# Patient Record
Sex: Female | Born: 1955 | Race: Black or African American | Hispanic: No | Marital: Married
Health system: Midwestern US, Community
[De-identification: ages and names within clinical notes are randomized; demographics above are authoritative.]

## PROBLEM LIST (undated history)

## (undated) DIAGNOSIS — N809 Endometriosis, unspecified: Secondary | ICD-10-CM

## (undated) DIAGNOSIS — M1990 Unspecified osteoarthritis, unspecified site: Secondary | ICD-10-CM

## (undated) DIAGNOSIS — G5600 Carpal tunnel syndrome, unspecified upper limb: Secondary | ICD-10-CM

## (undated) DIAGNOSIS — J45909 Unspecified asthma, uncomplicated: Secondary | ICD-10-CM

## (undated) DIAGNOSIS — E7800 Pure hypercholesterolemia, unspecified: Secondary | ICD-10-CM

## (undated) DIAGNOSIS — D219 Benign neoplasm of connective and other soft tissue, unspecified: Secondary | ICD-10-CM

## (undated) DIAGNOSIS — F419 Anxiety disorder, unspecified: Secondary | ICD-10-CM

## (undated) DIAGNOSIS — J309 Allergic rhinitis, unspecified: Secondary | ICD-10-CM

## (undated) DIAGNOSIS — E785 Hyperlipidemia, unspecified: Secondary | ICD-10-CM

## (undated) DIAGNOSIS — M542 Cervicalgia: Secondary | ICD-10-CM

## (undated) DIAGNOSIS — G43909 Migraine, unspecified, not intractable, without status migrainosus: Secondary | ICD-10-CM

## (undated) DIAGNOSIS — R7309 Other abnormal glucose: Secondary | ICD-10-CM

## (undated) DIAGNOSIS — K649 Unspecified hemorrhoids: Secondary | ICD-10-CM

## (undated) DIAGNOSIS — I639 Cerebral infarction, unspecified: Secondary | ICD-10-CM

## (undated) DIAGNOSIS — T7840XA Allergy, unspecified, initial encounter: Secondary | ICD-10-CM

## (undated) DIAGNOSIS — G459 Transient cerebral ischemic attack, unspecified: Secondary | ICD-10-CM

## (undated) DIAGNOSIS — N879 Dysplasia of cervix uteri, unspecified: Secondary | ICD-10-CM

## (undated) DIAGNOSIS — I10 Essential (primary) hypertension: Secondary | ICD-10-CM

## (undated) DIAGNOSIS — M25562 Pain in left knee: Secondary | ICD-10-CM

## (undated) DIAGNOSIS — Z96652 Presence of left artificial knee joint: Secondary | ICD-10-CM

## (undated) DIAGNOSIS — M1712 Unilateral primary osteoarthritis, left knee: Secondary | ICD-10-CM

## (undated) HISTORY — DX: Allergy, unspecified, initial encounter: T78.40XA

## (undated) HISTORY — DX: Other abnormal glucose: R73.09

## (undated) HISTORY — DX: Migraine, unspecified, not intractable, without status migrainosus: G43.909

## (undated) HISTORY — DX: Unspecified asthma, uncomplicated: J45.909

## (undated) HISTORY — DX: Transient cerebral ischemic attack, unspecified: G45.9

## (undated) HISTORY — DX: Carpal tunnel syndrome, unspecified upper limb: G56.00

## (undated) HISTORY — DX: Endometriosis, unspecified: N80.9

## (undated) HISTORY — DX: Anxiety disorder, unspecified: F41.9

## (undated) HISTORY — DX: Unspecified osteoarthritis, unspecified site: M19.90

## (undated) HISTORY — DX: Allergic rhinitis, unspecified: J30.9

## (undated) HISTORY — PX: OTHER SURGICAL HISTORY: SHX169

## (undated) HISTORY — DX: Unspecified hemorrhoids: K64.9

## (undated) HISTORY — PX: TUBAL LIGATION: SHX77

## (undated) HISTORY — PX: ABDOMINAL HYSTERECTOMY: SHX81

## (undated) HISTORY — PX: COLONOSCOPY: SHX174

## (undated) HISTORY — DX: Dysplasia of cervix uteri, unspecified: N87.9

## (undated) HISTORY — PX: TRIGGER FINGER RELEASE: SHX641

## (undated) HISTORY — PX: CARPAL TUNNEL RELEASE: SHX101

## (undated) HISTORY — DX: Hyperlipidemia, unspecified: E78.5

---

## 1998-03-31 ENCOUNTER — Other Ambulatory Visit: Admission: RE | Admit: 1998-03-31 | Discharge: 1998-03-31 | Payer: Self-pay | Admitting: Obstetrics and Gynecology

## 1998-05-09 ENCOUNTER — Ambulatory Visit (HOSPITAL_COMMUNITY): Admission: RE | Admit: 1998-05-09 | Discharge: 1998-05-09 | Payer: Self-pay | Admitting: Obstetrics and Gynecology

## 1998-06-22 ENCOUNTER — Observation Stay (HOSPITAL_COMMUNITY): Admission: RE | Admit: 1998-06-22 | Discharge: 1998-06-23 | Payer: Self-pay | Admitting: Obstetrics and Gynecology

## 1999-06-01 ENCOUNTER — Encounter: Payer: Self-pay | Admitting: Obstetrics and Gynecology

## 1999-06-01 ENCOUNTER — Other Ambulatory Visit: Admission: RE | Admit: 1999-06-01 | Discharge: 1999-06-01 | Payer: Self-pay | Admitting: Obstetrics and Gynecology

## 1999-06-01 ENCOUNTER — Ambulatory Visit (HOSPITAL_COMMUNITY): Admission: RE | Admit: 1999-06-01 | Discharge: 1999-06-01 | Payer: Self-pay | Admitting: Physician Assistant

## 1999-09-10 ENCOUNTER — Ambulatory Visit (HOSPITAL_COMMUNITY): Admission: RE | Admit: 1999-09-10 | Discharge: 1999-09-10 | Payer: Self-pay | Admitting: Obstetrics and Gynecology

## 1999-09-10 ENCOUNTER — Encounter: Payer: Self-pay | Admitting: Obstetrics and Gynecology

## 2000-06-23 ENCOUNTER — Other Ambulatory Visit: Admission: RE | Admit: 2000-06-23 | Discharge: 2000-06-23 | Payer: Self-pay | Admitting: Obstetrics and Gynecology

## 2000-09-10 ENCOUNTER — Ambulatory Visit (HOSPITAL_COMMUNITY): Admission: RE | Admit: 2000-09-10 | Discharge: 2000-09-10 | Payer: Self-pay | Admitting: Oral Surgery

## 2000-09-10 ENCOUNTER — Encounter: Payer: Self-pay | Admitting: Oral Surgery

## 2001-06-30 ENCOUNTER — Other Ambulatory Visit: Admission: RE | Admit: 2001-06-30 | Discharge: 2001-06-30 | Payer: Self-pay | Admitting: Obstetrics and Gynecology

## 2002-08-13 ENCOUNTER — Other Ambulatory Visit: Admission: RE | Admit: 2002-08-13 | Discharge: 2002-08-13 | Payer: Self-pay | Admitting: Obstetrics and Gynecology

## 2003-10-15 ENCOUNTER — Ambulatory Visit (HOSPITAL_COMMUNITY): Admission: RE | Admit: 2003-10-15 | Discharge: 2003-10-15 | Payer: Self-pay | Admitting: Family Medicine

## 2004-08-29 ENCOUNTER — Other Ambulatory Visit: Admission: RE | Admit: 2004-08-29 | Discharge: 2004-08-29 | Payer: Self-pay | Admitting: Obstetrics and Gynecology

## 2005-09-12 ENCOUNTER — Other Ambulatory Visit: Admission: RE | Admit: 2005-09-12 | Discharge: 2005-09-12 | Payer: Self-pay | Admitting: Obstetrics and Gynecology

## 2005-10-07 ENCOUNTER — Encounter: Admission: RE | Admit: 2005-10-07 | Discharge: 2005-10-07 | Payer: Self-pay | Admitting: Obstetrics and Gynecology

## 2006-05-02 ENCOUNTER — Encounter: Admission: RE | Admit: 2006-05-02 | Discharge: 2006-05-02 | Payer: Self-pay | Admitting: Family Medicine

## 2008-10-17 ENCOUNTER — Encounter: Payer: Self-pay | Admitting: Gastroenterology

## 2008-12-14 ENCOUNTER — Ambulatory Visit: Payer: Self-pay | Admitting: Gastroenterology

## 2008-12-28 ENCOUNTER — Ambulatory Visit: Payer: Self-pay | Admitting: Gastroenterology

## 2010-10-20 ENCOUNTER — Encounter: Payer: Self-pay | Admitting: Family Medicine

## 2010-10-21 ENCOUNTER — Encounter: Payer: Self-pay | Admitting: Obstetrics and Gynecology

## 2011-05-01 ENCOUNTER — Telehealth: Payer: Self-pay | Admitting: Gastroenterology

## 2011-05-01 NOTE — Telephone Encounter (Signed)
10Y  IS RECOMMEBDED WITH 2ND DEGREE RELATIVES AND  A PRIOR NEGATIVE COLON EXAM..Marland Kitchen

## 2011-05-01 NOTE — Telephone Encounter (Signed)
Informed Diana at Dr [facility], I have sent for the chart. Lafonda Mosses stated the pt's uncle had Colon Cancer and her dad had Stomach Cancer as well as other cancers. Does pt need 3 yr COLON? Pt [phone]   Cell [phone]

## 2011-05-01 NOTE — Telephone Encounter (Signed)
Notified Diana at [facility] that per Dr Jarold Motto, since her uncle is a 2nd degree relative and pt had a negative COLON, recommendations are for 10 year Recall COLON.

## 2011-05-01 NOTE — Telephone Encounter (Signed)
Dr Jarold Motto, pt's chart lists family hx of Colon Cancer - uncle. Please review the chart and see if the recall needs to be < 10years. Dr [facility] is questioning the amount of time. Chart is on your desk. Thanks.

## 2011-10-10 ENCOUNTER — Other Ambulatory Visit: Payer: Self-pay | Admitting: Obstetrics and Gynecology

## 2011-10-10 DIAGNOSIS — R928 Other abnormal and inconclusive findings on diagnostic imaging of breast: Secondary | ICD-10-CM

## 2011-10-22 ENCOUNTER — Ambulatory Visit
Admission: RE | Admit: 2011-10-22 | Discharge: 2011-10-22 | Disposition: A | Discharge status: Home / Self Care | Payer: Federal, State, Local not specified - PPO | Source: Ambulatory Visit | Attending: Obstetrics and Gynecology | Admitting: Obstetrics and Gynecology

## 2011-10-22 DIAGNOSIS — R928 Other abnormal and inconclusive findings on diagnostic imaging of breast: Secondary | ICD-10-CM

## 2012-05-29 ENCOUNTER — Other Ambulatory Visit: Payer: Self-pay | Admitting: Orthopedic Surgery

## 2012-06-08 ENCOUNTER — Ambulatory Visit (HOSPITAL_BASED_OUTPATIENT_CLINIC_OR_DEPARTMENT_OTHER)
Admission: RE | Admit: 2012-06-08 | Payer: Federal, State, Local not specified - PPO | Source: Ambulatory Visit | Admitting: Orthopedic Surgery

## 2012-06-08 ENCOUNTER — Encounter (HOSPITAL_BASED_OUTPATIENT_CLINIC_OR_DEPARTMENT_OTHER): Admission: RE | Payer: Self-pay | Source: Ambulatory Visit

## 2012-06-08 SURGERY — CARPAL TUNNEL RELEASE
Anesthesia: Choice | Laterality: Right

## 2012-09-30 DIAGNOSIS — I639 Cerebral infarction, unspecified: Secondary | ICD-10-CM

## 2012-09-30 HISTORY — DX: Cerebral infarction, unspecified: I63.9

## 2012-10-22 ENCOUNTER — Emergency Department (HOSPITAL_COMMUNITY): Payer: Federal, State, Local not specified - PPO

## 2012-10-22 ENCOUNTER — Encounter (HOSPITAL_COMMUNITY): Payer: Self-pay | Admitting: Emergency Medicine

## 2012-10-22 ENCOUNTER — Emergency Department (HOSPITAL_COMMUNITY)
Admission: EM | Admit: 2012-10-22 | Discharge: 2012-10-22 | Disposition: A | Discharge status: Home / Self Care | Payer: Federal, State, Local not specified - PPO | Attending: Emergency Medicine | Admitting: Emergency Medicine

## 2012-10-22 DIAGNOSIS — R51 Headache: Secondary | ICD-10-CM

## 2012-10-22 DIAGNOSIS — M542 Cervicalgia: Secondary | ICD-10-CM | POA: Insufficient documentation

## 2012-10-22 DIAGNOSIS — I10 Essential (primary) hypertension: Secondary | ICD-10-CM | POA: Insufficient documentation

## 2012-10-22 DIAGNOSIS — I059 Rheumatic mitral valve disease, unspecified: Secondary | ICD-10-CM

## 2012-10-22 DIAGNOSIS — E7800 Pure hypercholesterolemia, unspecified: Secondary | ICD-10-CM | POA: Insufficient documentation

## 2012-10-22 DIAGNOSIS — R110 Nausea: Secondary | ICD-10-CM | POA: Insufficient documentation

## 2012-10-22 DIAGNOSIS — R42 Dizziness and giddiness: Secondary | ICD-10-CM

## 2012-10-22 DIAGNOSIS — Z8742 Personal history of other diseases of the female genital tract: Secondary | ICD-10-CM | POA: Insufficient documentation

## 2012-10-22 DIAGNOSIS — Z79899 Other long term (current) drug therapy: Secondary | ICD-10-CM | POA: Insufficient documentation

## 2012-10-22 HISTORY — DX: Essential (primary) hypertension: I10

## 2012-10-22 HISTORY — DX: Cervicalgia: M54.2

## 2012-10-22 HISTORY — DX: Benign neoplasm of connective and other soft tissue, unspecified: D21.9

## 2012-10-22 HISTORY — DX: Pure hypercholesterolemia, unspecified: E78.00

## 2012-10-22 LAB — HEMOGLOBIN A1C: Mean Plasma Glucose: 128 mg/dL — ABNORMAL HIGH (ref <117)

## 2012-10-22 LAB — COMPREHENSIVE METABOLIC PANEL
ALT: 19 U/L (ref 0–35)
AST: 18 U/L (ref 0–37)
Albumin: 3.9 g/dL (ref 3.5–5.2)
Calcium: 9.8 mg/dL (ref 8.4–10.5)
Chloride: 106 mEq/L (ref 96–112)
Creatinine, Ser: 0.6 mg/dL (ref 0.50–1.10)
Sodium: 141 mEq/L (ref 135–145)

## 2012-10-22 LAB — PROTIME-INR: Prothrombin Time: 13.1 seconds (ref 11.6–15.2)

## 2012-10-22 LAB — LIPID PANEL
LDL Cholesterol: 95 mg/dL (ref 0–99)
VLDL: 14 mg/dL (ref 0–40)

## 2012-10-22 LAB — CBC
MCH: 27 pg (ref 26.0–34.0)
MCV: 80 fL (ref 78.0–100.0)
Platelets: 185 10*3/uL (ref 150–400)
RDW: 14.3 % (ref 11.5–15.5)
WBC: 5.2 10*3/uL (ref 4.0–10.5)

## 2012-10-22 LAB — APTT: aPTT: 26 seconds (ref 24–37)

## 2012-10-22 MED ORDER — ASPIRIN 325 MG PO TABS
325.0000 mg | ORAL_TABLET | Freq: Once | ORAL | Status: AC
  Administered 2012-10-22: 325 mg via ORAL
  Filled 2012-10-22: qty 1

## 2012-10-22 NOTE — ED Notes (Signed)
Pt c/o headache ongoing for "years". Pt also c/o pain in bottom of feet, pain in calf and dizziness.

## 2012-10-22 NOTE — Progress Notes (Signed)
  Echocardiogram 2D Echocardiogram has been performed.  Dawn [name] A 10/22/2012, 11:50 AM

## 2012-10-22 NOTE — ED Provider Notes (Signed)
History     CSN: [id number]  Arrival date & time 10/22/12  0806   First MD Initiated Contact with Patient 10/22/12 [phone]      Chief Complaint  Patient presents with  . Headache    (Consider location/radiation/quality/duration/timing/severity/associated sxs/prior treatment) HPI Comments: Patient with a history chronic neck pain, hypertension, and high cholesterol presents with a headache and episodes of dizziness and slurred speech. She states she has had headaches for over 20 years. One week ago she experienced a couple episodes of slurred speech and facial droop that started suddenly and lasted for less than a minute. She states that over the past 24 hours she has been dizzy and felt unbalanced. She denies lightheadedness/near-syncope and vertigo. She has not fallen but feels unsteady walking and standing, like she is being pulled to one side. She denies numbness or weakness in her extremities. She She states she has been taking Marlin Canary Powder for her headaches and that has seemed to help temporarily. She experiences nausea without vomiting. No diarrhea or constipation. No fevers. No chest pain or SOB. Her mother has a history of stroke and aneurysm. Onset gradual, course constant. Nothing has alleviated symptoms.   Patient is a 57 y.o. female presenting with headaches. The history is provided by the patient.  Headache  Associated symptoms include nausea. Pertinent negatives include no fever, no shortness of breath and no vomiting.    Past Medical History  Diagnosis Date  . Neck pain   . Hypertension   . High cholesterol   . Fibroids     Past Surgical History  Procedure Date  . Abdominal hysterectomy Partial    No family history on file.  History  Substance Use Topics  . Smoking status: Never Smoker   . Smokeless tobacco: Not on file  . Alcohol Use: 0.6 oz/week    1 Glasses of wine per week     Comment: seldom    OB History    Grav Para Term Preterm Abortions TAB SAB Ect  Mult Living                  Review of Systems  Constitutional: Negative for fever, chills and activity change.  HENT: Positive for neck pain. Negative for hearing loss, ear pain, congestion, rhinorrhea, neck stiffness, dental problem, sinus pressure, tinnitus and ear discharge.   Eyes: Negative for photophobia, pain, discharge, redness and visual disturbance.  Respiratory: Negative for shortness of breath.   Cardiovascular: Negative for chest pain.  Gastrointestinal: Positive for nausea. Negative for vomiting, abdominal pain, diarrhea and constipation.  Musculoskeletal: Positive for arthralgias. Negative for gait problem.  Skin: Negative for rash.  Neurological: Positive for dizziness (dysequilbrium), speech difficulty (resolved) and headaches (at baseline). Negative for syncope, facial asymmetry, weakness, light-headedness and numbness.  Psychiatric/Behavioral: Negative for confusion.    Allergies  Review of patient's allergies indicates no known allergies.  Home Medications   Current Outpatient Rx  Name  Route  Sig  Dispense  Refill  . GOODY HEADACHE PO   Oral   Take 1 packet by mouth daily as needed. For pain         . CALCIUM-VITAMIN D PO   Oral   Take 1 tablet by mouth daily.         . CYCLOBENZAPRINE HCL 10 MG PO TABS   Oral   Take 10 mg by mouth 3 (three) times daily as needed. For headache         . ADULT MULTIVITAMIN W/MINERALS  CH   Oral   Take 1 tablet by mouth daily.         Marland Kitchen NAPROXEN SODIUM 220 MG PO TABS   Oral   Take 440 mg by mouth 2 (two) times daily as needed. For pain         . ROSUVASTATIN CALCIUM 5 MG PO TABS   Oral   Take 5 mg by mouth daily.         . TRIAMTERENE-HCTZ 37.5-25 MG PO TABS   Oral   Take 1 tablet by mouth daily.           BP 123/81  Pulse 84  Temp 98.1 F (36.7 C) (Oral)  Resp 18  SpO2 99%  Physical Exam  Nursing note and vitals reviewed. Constitutional: She is oriented to person, place, and time. She  appears well-developed and well-nourished.  HENT:  Head: Normocephalic and atraumatic.  Right Ear: Tympanic membrane, external ear and ear canal normal.  Left Ear: Tympanic membrane, external ear and ear canal normal.  Nose: Nose normal.  Mouth/Throat: Uvula is midline, oropharynx is clear and moist and mucous membranes are normal.  Eyes: Conjunctivae normal, EOM and lids are normal. Pupils are equal, round, and reactive to light. Right eye exhibits no nystagmus. Left eye exhibits no nystagmus.  Neck: Normal range of motion. Neck supple. Carotid bruit is not present.  Cardiovascular: Normal rate, regular rhythm and normal heart sounds.   Pulmonary/Chest: Effort normal and breath sounds normal.  Abdominal: Soft. Bowel sounds are normal. There is no tenderness.  Musculoskeletal: Normal range of motion.       Cervical back: She exhibits normal range of motion, no tenderness and no bony tenderness.  Neurological: She is alert and oriented to person, place, and time. She has normal strength and normal reflexes. No cranial nerve deficit or sensory deficit. Gait (unsteady) abnormal. Coordination normal. GCS eye subscore is 4. GCS verbal subscore is 5. GCS motor subscore is 6.       Patient very unsteady during Romberg testing  Skin: Skin is warm and dry.  Psychiatric: She has a normal mood and affect.    ED Course  Procedures (including critical care time)  Labs Reviewed  COMPREHENSIVE METABOLIC PANEL - Abnormal; Notable for the following:    Glucose, Bld 101 (*)     All other components within normal limits  CBC  PROTIME-INR  APTT  LIPID PANEL  HEMOGLOBIN A1C  URINALYSIS, ROUTINE W REFLEX MICROSCOPIC   Mr Brain Wo Contrast  10/22/2012  *RADIOLOGY REPORT*  Clinical Data:  Headaches and dizziness.  Hypertension.  High cholesterol.  Family history of aneurysm.  MRI BRAIN WITHOUT CONTRAST MRA HEAD WITHOUT CONTRAST  Technique: Multiplanar, multiecho pulse sequences of the brain and  surrounding structures were obtained according to standard protocol without intravenous contrast.  Angiographic images of the head were obtained using MRA technique without contrast.  Comparison: MRI and MRA angiogram 10/15/2003.  MRI HEAD  Findings:  No acute infarct.  No MR evidence of intracranial hemorrhage.  Mild nonspecific white matter type changes probably related to result of small vessel disease in this hypertensive patient with high cholesterol.  Other causes of white matter type changes felt to be less likely considerations include that secondary to; vasculitis, inflammatory process, infectious process, demyelinating process or migraine headaches.  No hydrocephalus.  No intracranial mass lesion detected on this unenhanced exam.  Major intracranial vascular structures are patent.  Slightly heterogeneous appearance of bone marrow without significant change  and probably normal for this patient.  Cervical medullary junction, pituitary region and pineal region unremarkable.  Very mild exophthalmos.  Minimal mucosal thickening ethmoid sinus air cell and maxillary sinuses.  IMPRESSION: No acute infarct.  Mild small vessel disease type changes suspected as discussed above.  MRA HEAD  Findings: No aneurysm identified.  Left posterior communicating artery infundibulum slightly larger than right.  Minimal irregularity anterior aspect the cavernous segment left internal carotid artery.  Anterior circulation without medium or large size vessel significant stenosis or occlusion.  Left vertebral artery is dominant in size.  Mild branch vessel irregularity.  IMPRESSION: No aneurysm noted.  Please see above.   Original Report Authenticated By: Lacy Duverney, M.D.    Mr Mra Head/brain Wo Cm  10/22/2012  *RADIOLOGY REPORT*  Clinical Data:  Headaches and dizziness.  Hypertension.  High cholesterol.  Family history of aneurysm.  MRI BRAIN WITHOUT CONTRAST MRA HEAD WITHOUT CONTRAST  Technique: Multiplanar, multiecho pulse  sequences of the brain and surrounding structures were obtained according to standard protocol without intravenous contrast.  Angiographic images of the head were obtained using MRA technique without contrast.  Comparison: MRI and MRA angiogram 10/15/2003.  MRI HEAD  Findings:  No acute infarct.  No MR evidence of intracranial hemorrhage.  Mild nonspecific white matter type changes probably related to result of small vessel disease in this hypertensive patient with high cholesterol.  Other causes of white matter type changes felt to be less likely considerations include that secondary to; vasculitis, inflammatory process, infectious process, demyelinating process or migraine headaches.  No hydrocephalus.  No intracranial mass lesion detected on this unenhanced exam.  Major intracranial vascular structures are patent.  Slightly heterogeneous appearance of bone marrow without significant change and probably normal for this patient.  Cervical medullary junction, pituitary region and pineal region unremarkable.  Very mild exophthalmos.  Minimal mucosal thickening ethmoid sinus air cell and maxillary sinuses.  IMPRESSION: No acute infarct.  Mild small vessel disease type changes suspected as discussed above.  MRA HEAD  Findings: No aneurysm identified.  Left posterior communicating artery infundibulum slightly larger than right.  Minimal irregularity anterior aspect the cavernous segment left internal carotid artery.  Anterior circulation without medium or large size vessel significant stenosis or occlusion.  Left vertebral artery is dominant in size.  Mild branch vessel irregularity.  IMPRESSION: No aneurysm noted.  Please see above.   Original Report Authenticated By: Lacy Duverney, M.D.      1. Dizziness   2. Headache     9:05 AM Patient seen and examined. D/w Dr. Ignacia Palma. Started on TIA protocol. Will get MRI.   Vital signs reviewed and are as follows: Filed Vitals:   10/22/12 0841  BP:   Pulse:     Temp: 98.2 F (36.8 C)  Resp:   BP 123/81  Pulse 84  Temp 98.2 F (36.8 C) (Oral)  Resp 18  SpO2 99%  All results reviewed by myself. Patient informed. Results d/w Dr. Ignacia Palma.   Patient to continue taking aspirin every day. She has primary care physician followup tomorrow.  Patient counseled to return if they have weakness in their arms or legs, slurred speech, trouble walking or talking, confusion, trouble with their balance, or if they have any other concerns. Patient verbalizes understanding and agrees with plan.     MDM  Patient with TIA symptoms, workup in emergency department is negative. Patient has PCP followup. Patient at neurological baseline at discharge.  Renne Crigler, [place] 10/22/12 [phone]

## 2012-10-22 NOTE — ED Notes (Signed)
Spoke with MRI, pt was taken to vascular for her doppler after her MRI was finished.

## 2012-10-22 NOTE — ED Provider Notes (Signed)
Medical screening examination/treatment/procedure(s) were performed by non-physician practitioner and as supervising physician I was immediately available for consultation/collaboration.   Carleene Cooper III, MD 10/22/12 2025

## 2012-10-22 NOTE — Progress Notes (Signed)
*  PRELIMINARY RESULTS* Vascular Ultrasound Carotid Duplex (Doppler) has been completed.  There is no obvious evidence of hemodynamically significant internal carotid artery stenosis >40% bilaterally. Vertebral arteries are patent with antegrade flow.  10/22/2012 11:30 AM Gertie Fey, RDMS, RDCS

## 2013-10-15 ENCOUNTER — Other Ambulatory Visit: Payer: Self-pay | Admitting: Obstetrics and Gynecology

## 2013-10-15 DIAGNOSIS — R928 Other abnormal and inconclusive findings on diagnostic imaging of breast: Secondary | ICD-10-CM

## 2013-10-25 ENCOUNTER — Other Ambulatory Visit: Payer: Self-pay | Admitting: Obstetrics and Gynecology

## 2013-10-25 ENCOUNTER — Ambulatory Visit
Admission: RE | Admit: 2013-10-25 | Discharge: 2013-10-25 | Disposition: A | Discharge status: Home / Self Care | Payer: Federal, State, Local not specified - PPO | Source: Ambulatory Visit | Attending: Obstetrics and Gynecology | Admitting: Obstetrics and Gynecology

## 2013-10-25 DIAGNOSIS — R921 Mammographic calcification found on diagnostic imaging of breast: Secondary | ICD-10-CM

## 2013-10-25 DIAGNOSIS — R928 Other abnormal and inconclusive findings on diagnostic imaging of breast: Secondary | ICD-10-CM

## 2013-10-27 ENCOUNTER — Ambulatory Visit
Admission: RE | Admit: 2013-10-27 | Discharge: 2013-10-27 | Disposition: A | Discharge status: Home / Self Care | Payer: Federal, State, Local not specified - PPO | Source: Ambulatory Visit | Attending: Obstetrics and Gynecology | Admitting: Obstetrics and Gynecology

## 2013-10-27 DIAGNOSIS — R921 Mammographic calcification found on diagnostic imaging of breast: Secondary | ICD-10-CM

## 2014-01-21 ENCOUNTER — Encounter: Payer: Self-pay | Admitting: Internal Medicine

## 2014-03-15 ENCOUNTER — Ambulatory Visit (INDEPENDENT_AMBULATORY_CARE_PROVIDER_SITE_OTHER): Payer: Federal, State, Local not specified - PPO | Admitting: Internal Medicine

## 2014-03-15 ENCOUNTER — Encounter: Payer: Self-pay | Admitting: Internal Medicine

## 2014-03-15 VITALS — BP 108/80 | HR 80 | Ht 62.0 in | Wt 169.8 lb

## 2014-03-15 DIAGNOSIS — Z800 Family history of malignant neoplasm of digestive organs: Secondary | ICD-10-CM

## 2014-03-15 DIAGNOSIS — Z1211 Encounter for screening for malignant neoplasm of colon: Secondary | ICD-10-CM

## 2014-03-15 MED ORDER — MOVIPREP 100 G PO SOLR: 1.0000 | Freq: Once | ORAL | Status: DC

## 2014-03-15 NOTE — Patient Instructions (Signed)

## 2014-03-15 NOTE — Progress Notes (Signed)
HISTORY OF PRESENT ILLNESS:  Dawn [name] is a 58 y.o. female with past medical history as listed below. She is sent today by her primary care provider, Dr. Kelton Pillar, regarding screening colonoscopy. Patient underwent initial screening colonoscopy in March of 2010 with Dr. Verl Blalock. The examination was normal. Followup in 10 years recommended. Patient is sent today regarding interval colonoscopy at this time given a family history of colon cancer. She reports that her father died of advanced colon cancer at age 27. As well, and maternal uncle with a history of colon cancer in his 10s. Except for occasional constipation, the patient's GI review of systems is unremarkable. Her chronic medical problems are stable. She recently underwent her annual comprehensive evaluation in April (reviewed the office note).  REVIEW OF SYSTEMS:  All non-GI ROS negative except for sinus and allergy trouble, arthritis, headaches, itching, muscle cramps, skin rash, breast changes  Past Medical History  Diagnosis Date  . Neck pain   . Hypertension   . High cholesterol   . Fibroids   . Transient cerebral ischemia   . Allergic rhinitis   . GERD (gastroesophageal reflux disease)   . Carpal tunnel syndrome     right  . Anxiety   . Hemorrhoids   . Migraine   . Endometriosis   . Cervical dysplasia     1996  . Abnormal glucose   . Hyperlipemia     Past Surgical History  Procedure Laterality Date  . Abdominal hysterectomy  Partial  . Cold knife cone      for dysplasia  . Tubal ligation      Social History Dawn [name]  reports that she has never smoked. She has never used smokeless tobacco. She reports that she does not drink alcohol or use illicit drugs.  family history includes Breast cancer in her maternal aunt; CVA in her mother; Colon cancer in her father and maternal uncle; Diabetes in her mother; Heart disease in her brother; Hypertension in her mother; Kidney disease in her  maternal aunt and mother.  Allergies  Allergen Reactions  . Sulfa Antibiotics Rash       PHYSICAL EXAMINATION: Vital signs: BP 108/80  Pulse 80  Ht 5\' 2"  (1.575 m)  Wt 169 lb 12.8 oz (77.021 kg)  BMI 31.05 kg/m2  Constitutional: generally well-appearing, no acute distress Psychiatric: alert and oriented x3, cooperative Eyes: extraocular movements intact, anicteric, conjunctiva pink Mouth: oral pharynx moist, no lesions Neck: supple no lymphadenopathy Cardiovascular: heart regular rate and rhythm, no murmur Lungs: clear to auscultation bilaterally Abdomen: soft, obese, nontender, nondistended, no obvious ascites, no peritoneal signs, normal bowel sounds, no organomegaly Rectal: Deferred until colonoscopy Extremities: no lower extremity edema bilaterally Skin: no lesions on visible extremities Neuro: No focal deficits.   ASSESSMENT:  #1. Family history of colon cancer as described. Due for surveillance #2. Index screening colonoscopy 2010 normal #3. General medical problems. Stable   PLAN:  #1. Screening colonoscopy.The nature of the procedure, as well as the risks, benefits, and alternatives were carefully and thoroughly reviewed with the patient. Ample time for discussion and questions allowed. The patient understood, was satisfied, and agreed to proceed. Movi prep prescribed. Patient instructed on its use

## 2014-03-28 ENCOUNTER — Encounter: Payer: Self-pay | Admitting: Internal Medicine

## 2014-05-02 ENCOUNTER — Other Ambulatory Visit: Payer: Self-pay | Admitting: Family Medicine

## 2014-05-02 ENCOUNTER — Ambulatory Visit
Admission: RE | Admit: 2014-05-02 | Discharge: 2014-05-02 | Disposition: A | Discharge status: Home / Self Care | Payer: Federal, State, Local not specified - PPO | Source: Ambulatory Visit | Attending: Family Medicine | Admitting: Family Medicine

## 2014-05-02 DIAGNOSIS — R059 Cough, unspecified: Secondary | ICD-10-CM

## 2014-05-02 DIAGNOSIS — R05 Cough: Secondary | ICD-10-CM

## 2014-05-02 DIAGNOSIS — R079 Chest pain, unspecified: Secondary | ICD-10-CM

## 2014-05-18 ENCOUNTER — Encounter: Payer: Federal, State, Local not specified - PPO | Admitting: Internal Medicine

## 2014-07-21 ENCOUNTER — Encounter: Payer: Federal, State, Local not specified - PPO | Admitting: Internal Medicine

## 2014-07-22 ENCOUNTER — Other Ambulatory Visit: Payer: Self-pay | Admitting: Family Medicine

## 2014-07-22 DIAGNOSIS — M5412 Radiculopathy, cervical region: Secondary | ICD-10-CM

## 2014-07-23 ENCOUNTER — Ambulatory Visit
Admission: RE | Admit: 2014-07-23 | Discharge: 2014-07-23 | Disposition: A | Discharge status: Home / Self Care | Payer: Federal, State, Local not specified - PPO | Source: Ambulatory Visit | Attending: Family Medicine | Admitting: Family Medicine

## 2014-07-23 DIAGNOSIS — M5412 Radiculopathy, cervical region: Secondary | ICD-10-CM

## 2014-10-11 ENCOUNTER — Encounter: Payer: Self-pay | Admitting: Internal Medicine

## 2014-10-17 ENCOUNTER — Other Ambulatory Visit: Payer: Self-pay | Admitting: Obstetrics and Gynecology

## 2014-10-18 LAB — CYTOLOGY - PAP

## 2014-11-24 ENCOUNTER — Ambulatory Visit (AMBULATORY_SURGERY_CENTER): Payer: Self-pay | Admitting: *Deleted

## 2014-11-24 VITALS — Ht 62.0 in | Wt 172.4 lb

## 2014-11-24 DIAGNOSIS — Z800 Family history of malignant neoplasm of digestive organs: Secondary | ICD-10-CM

## 2014-11-24 NOTE — Progress Notes (Signed)
No egg or soy allergy No issues with past sedation No diet pills no home 02 use emmi video to e mail Pt has a movi prep kit from 02-2014 that is not expired or has been mixed

## 2014-12-08 ENCOUNTER — Encounter: Payer: Self-pay | Admitting: Internal Medicine

## 2014-12-08 ENCOUNTER — Ambulatory Visit (AMBULATORY_SURGERY_CENTER): Payer: Federal, State, Local not specified - PPO | Admitting: Internal Medicine

## 2014-12-08 VITALS — BP 133/88 | HR 72 | Temp 97.6°F | Resp 16 | Ht 62.0 in | Wt 172.0 lb

## 2014-12-08 DIAGNOSIS — Z1211 Encounter for screening for malignant neoplasm of colon: Secondary | ICD-10-CM | POA: Diagnosis not present

## 2014-12-08 DIAGNOSIS — D124 Benign neoplasm of descending colon: Secondary | ICD-10-CM | POA: Diagnosis not present

## 2014-12-08 DIAGNOSIS — D120 Benign neoplasm of cecum: Secondary | ICD-10-CM | POA: Diagnosis not present

## 2014-12-08 DIAGNOSIS — Z800 Family history of malignant neoplasm of digestive organs: Secondary | ICD-10-CM

## 2014-12-08 MED ORDER — SODIUM CHLORIDE 0.9 % IV SOLN: 500.0000 mL | INTRAVENOUS | Status: DC

## 2014-12-08 NOTE — Progress Notes (Signed)
Called to room to assist during endoscopic procedure.  Patient ID and intended procedure confirmed with present staff. Received instructions for my participation in the procedure from the performing physician.  

## 2014-12-08 NOTE — Progress Notes (Signed)
Pt complains of headache(rates at 5) this AM.  States she took her BP medication last PM.  BP is elevated this AM.  Denies difficulty with vision or weakness.  She states she has Had a constant headache since last weekend.  Reported to [facility] Monday CRNA.

## 2014-12-08 NOTE — Patient Instructions (Signed)
YOU HAD AN ENDOSCOPIC PROCEDURE TODAY AT THE [facility]:   Refer to the procedure report that was given to you for any specific questions about what was found during the examination.  If the procedure report does not answer your questions, please call your gastroenterologist to clarify.  If you requested that your care partner not be given the details of your procedure findings, then the procedure report has been included in a sealed envelope for you to review at your convenience later.  YOU SHOULD EXPECT: Some feelings of bloating in the abdomen. Passage of more gas than usual.  Walking can help get rid of the air that was put into your GI tract during the procedure and reduce the bloating. If you had a lower endoscopy (such as a colonoscopy or flexible sigmoidoscopy) you may notice spotting of blood in your stool or on the toilet paper. If you underwent a bowel prep for your procedure, you may not have a normal bowel movement for a few days.  Please Note:  You might notice some irritation and congestion in your nose or some drainage.  This is from the oxygen used during your procedure.  There is no need for concern and it should clear up in a day or so.  SYMPTOMS TO REPORT IMMEDIATELY:   Following lower endoscopy (colonoscopy or flexible sigmoidoscopy):  Excessive amounts of blood in the stool  Significant tenderness or worsening of abdominal pains  Swelling of the abdomen that is new, acute  Fever of 100F or higher   For urgent or emergent issues, a gastroenterologist can be reached at any hour by calling [phone].   DIET: Your first meal following the procedure should be a small meal and then it is ok to progress to your normal diet. Heavy or fried foods are harder to digest and may make you feel nauseous or bloated.  Likewise, meals heavy in dairy and vegetables can increase bloating.  Drink plenty of fluids but you should avoid alcoholic beverages for 24  hours.  ACTIVITY:  You should plan to take it easy for the rest of today and you should NOT DRIVE or use heavy machinery until tomorrow (because of the sedation medicines used during the test).    FOLLOW UP: Our staff will call the number listed on your records the next business day following your procedure to check on you and address any questions or concerns that you may have regarding the information given to you following your procedure. If we do not reach you, we will leave a message.  However, if you are feeling well and you are not experiencing any problems, there is no need to return our call.  We will assume that you have returned to your regular daily activities without incident.  If any biopsies were taken you will be contacted by phone or by letter within the next 1-3 weeks.  Please call us at [phone] if you have not heard about the biopsies in 3 weeks.    SIGNATURES/CONFIDENTIALITY: You and/or your care partner have signed paperwork which will be entered into your electronic medical record.  These signatures attest to the fact that that the information above on your After Visit Summary has been reviewed and is understood.  Full responsibility of the confidentiality of this discharge information lies with you and/or your care-partner. 

## 2014-12-08 NOTE — Progress Notes (Signed)
Report to PACU, RN, vss, BBS= Clear.  

## 2014-12-08 NOTE — Op Note (Signed)
[facility]  [address] [place], [id number]   COLONOSCOPY PROCEDURE REPORT  PATIENT: Dawn, [name]  MR#: [id number] BIRTHDATE: 1956-05-06 , 73  yrs. old GENDER: female ENDOSCOPIST: Eustace Quail, MD REFERRED VO:PFYTWK Griffin, M.D. PROCEDURE DATE:  12/08/2014 PROCEDURE:   Colonoscopy, screening and Colonoscopy with snare polypectomy x 2 First Screening Colonoscopy - Avg.  risk and is 50 yrs.  old or older - No.  Prior Negative Screening - Now for repeat screening. Above average risk  History of Adenoma - Now for follow-up colonoscopy & has been > or = to 3 yrs.  N/A  Polyps Removed Today ASA CLASS:   Class II INDICATIONS:Screening for colonic neoplasia and FH Colon or Rectal Adenocarcinoma.  Dad and uncle early 53's. Last exam 2010 (-) MEDICATIONS: Monitored anesthesia care and Propofol 200 mg IV  DESCRIPTION OF PROCEDURE:   After the risks benefits and alternatives of the procedure were thoroughly explained, informed consent was obtained.  The digital rectal exam revealed no abnormalities of the rectum.   The LB [id number]  endoscope was introduced through the anus and advanced to the cecum, which was identified by both the appendix and ileocecal valve. No adverse events experienced.   The quality of the prep was excellent. (MoviPrep was used)  The instrument was then slowly withdrawn as the colon was fully examined.  COLON FINDINGS: Two polyps ranging between 3-25mm in size were found at the cecum and in the descending colon.  A polypectomy was performed with a cold snare.  The resection was complete, the polyp tissue was completely retrieved and sent to histology.   There was mild diverticulosis noted in the sigmoid colon.   The examination was otherwise normal.  Retroflexed views revealed internal hemorrhoids. The time to cecum = 1.9 Withdrawal time = 8.8   The scope was withdrawn and the procedure completed. COMPLICATIONS: There were no  immediate complications.  ENDOSCOPIC IMPRESSION: 1.   Two polyps were found at the cecum and in the descending colon; polypectomy was performed with a cold snare 2.   Mild diverticulosis was noted in the sigmoid colon 3.   The examination was otherwise normal  RECOMMENDATIONS: 1. Follow up colonoscopy in 5 years  eSigned:  Eustace Quail, MD 12/08/2014 10:16 AM   cc: Kelton Pillar, MD and The Patient

## 2014-12-09 ENCOUNTER — Telehealth: Payer: Self-pay | Admitting: *Deleted

## 2014-12-09 NOTE — Telephone Encounter (Signed)
  Follow up Call-  Call back number 12/08/2014  Post procedure Call Back phone  # [phone]  Permission to leave phone message Yes     Patient questions:  Do you have a fever, pain , or abdominal swelling? No. Pain Score  0 *  Have you tolerated food without any problems? Yes.    Have you been able to return to your normal activities? Yes.    Do you have any questions about your discharge instructions: Diet   No. Medications  No. Follow up visit  No.  Do you have questions or concerns about your Care? No.  Actions: * If pain score is 4 or above: No action needed, pain <4.

## 2014-12-13 ENCOUNTER — Encounter: Payer: Self-pay | Admitting: Internal Medicine

## 2015-02-04 ENCOUNTER — Ambulatory Visit (INDEPENDENT_AMBULATORY_CARE_PROVIDER_SITE_OTHER): Payer: Federal, State, Local not specified - PPO

## 2015-02-04 ENCOUNTER — Ambulatory Visit (INDEPENDENT_AMBULATORY_CARE_PROVIDER_SITE_OTHER): Payer: Federal, State, Local not specified - PPO | Admitting: Family Medicine

## 2015-02-04 VITALS — BP 132/84 | HR 87 | Temp 98.4°F | Resp 16 | Ht 62.25 in | Wt 171.2 lb

## 2015-02-04 DIAGNOSIS — M25562 Pain in left knee: Secondary | ICD-10-CM

## 2015-02-04 NOTE — Progress Notes (Signed)
[facility] [address] [place] [address] [phone]  Date:  02/04/2015   Name:  Dawn [name]   DOB:  [birth year]   MRN:  [id number]  PCP:  Osborne Casco, MD    Chief Complaint: Knee Pain; Numbness; and Glucose testing   History of Present Illness:  Dawn [name] is a 59 y.o. very pleasant female patient who presents with the following:  Here today as a new patient.  History of HTN, high cholesterol, possible TIA in 2014.   She got hurt in a fall on some stairs which occurred about one week ago.  She was going down some stairs, was carrying a box and slipped.  She got some bruises on her left arm, legs.  She hit her knees.  She now notes that her knees will be stiff if she sits for a while, and also her left knee will pop.   Her left knee hurts more than the right.   Also, if she sits for a period of time her left toes will feel numb.  Her toes may go numb if she sits for a few hours (like on a long drive) but resolved with moving around.     There are no active problems to display for this patient.   Past Medical History  Diagnosis Date  . Neck pain   . Hypertension   . High cholesterol   . Fibroids   . Transient cerebral ischemia     10-22-12 ED, tia s/s but negative work up in ED. see note   . Allergic rhinitis   . GERD (gastroesophageal reflux disease)   . Carpal tunnel syndrome     right  . Anxiety   . Hemorrhoids   . Migraine   . Endometriosis   . Cervical dysplasia     1996  . Abnormal glucose   . Hyperlipemia   . Arthritis     Past Surgical History  Procedure Laterality Date  . Abdominal hysterectomy  Partial  . Cold knife cone      for dysplasia  . Tubal ligation    . Carpal tunnel release Right   . Trigger finger release      thumb, middle finger   . Colonoscopy      last 11-2008 w/patterson, hems only     History  Substance Use Topics  . Smoking status: Never Smoker   . Smokeless tobacco: Never Used  .  Alcohol Use: 0.6 oz/week    1 Glasses of wine per week     Comment: occasionally    Family History  Problem Relation Age of Onset  . Colon cancer Father   . Stomach cancer Father   . Diabetes Mother   . Hypertension Mother   . CVA Mother   . Kidney disease Mother   . Colon cancer Maternal Uncle   . Breast cancer Maternal Aunt   . Kidney disease Maternal Aunt   . Heart disease Brother   . Throat cancer Maternal Uncle   . Rectal cancer Neg Hx     Allergies  Allergen Reactions  . Sulfa Antibiotics Rash    Medication list has been reviewed and updated.  Current Outpatient Prescriptions on File Prior to Visit  Medication Sig Dispense Refill  . aspirin 81 MG chewable tablet Chew 81 mg by mouth daily.    . Aspirin-Acetaminophen-Caffeine (GOODY HEADACHE PO) Take 1 packet by mouth daily as needed. For pain    .  cholecalciferol (VITAMIN D) 1000 UNITS tablet Take 2,000 Units by mouth daily.    . cyclobenzaprine (FLEXERIL) 10 MG tablet Take 10 mg by mouth 3 (three) times daily as needed. For headache    . Multiple Vitamin (MULTIVITAMIN WITH MINERALS) TABS Take 1 tablet by mouth daily.    . rosuvastatin (CRESTOR) 5 MG tablet Take 5 mg by mouth daily.    Marland Kitchen triamterene-hydrochlorothiazide (MAXZIDE-25) 37.5-25 MG per tablet Take 1 tablet by mouth daily.    . naproxen sodium (ANAPROX) 220 MG tablet Take 440 mg by mouth 2 (two) times daily as needed. For pain     No current facility-administered medications on file prior to visit.    Review of Systems:  As per HPI- otherwise negative. No head or facial injury in the fall.    Physical Examination: Filed Vitals:   02/04/15 1141  BP: 132/84  Pulse: 87  Temp: 98.4 F (36.9 C)  Resp: 16   Filed Vitals:   02/04/15 1141  Height: 5' 2.25" (1.581 m)  Weight: 171 lb 3.2 oz (77.656 kg)   Body mass index is 31.07 kg/(m^2). Ideal Body Weight: Weight in (lb) to have BMI = 25: 137.5  GEN: WDWN, NAD, Non-toxic, A & O x 3, overweight,  looks well HEENT: Atraumatic, Normocephalic. Neck supple. No masses, No LAD. Ears and Nose: No external deformity. CV: RRR, No M/G/R. No JVD. No thrill. No extra heart sounds. PULM: CTA B, no wheezes, crackles, rhonchi. No retractions. No resp. distress. No accessory muscle use. EXTR: No c/c/e NEURO Normal gait.  PSYCH: Normally interactive. Conversant. Not depressed or anxious appearing.  Calm demeanor.  She has a few resolving ecchymosis on her arms and lateral right shin.   Right knee is normal- no effusion, bruise or crepitus.  Ligaments are stable Left knee: tenderness with pressure/ lateral stress on patella.  Mild crepitus. No effusion, no heat. Ligaments are stable  [facility] reading (PRIMARY) by  Dr. Lorelei Pont. Left knee: mild degenerative change, no fracture   LEFT KNEE - COMPLETE 4+ VIEW  COMPARISON: None.  FINDINGS: Anatomic alignment of the LEFT knee. No fracture. Mild medial compartment osteoarthritis with marginal osteophytes. Mild patellofemoral osteoarthritis. No effusion.  IMPRESSION: Mild medial and patellofemoral osteoarthritis without acute osseous abnormality.  Assessment and Plan: Pain in left knee - Plan: DG Knee Complete 4 Views Left  Knee sprain following a fall last week.   Placed in a hinged knee brace, and she will use naproxen twice a day for a week or so If not resolving will look further with ortho referral or MRI  Signed Lamar Blinks, MD

## 2015-02-04 NOTE — Patient Instructions (Signed)
Wear your knee brace and try ice, elevation for your knee pan Use aleve twice a day for about one week  If your knee is still bothering you give me a call and we will either refer you to an orthopedist or order an MRI for you Take care!

## 2015-03-01 ENCOUNTER — Other Ambulatory Visit: Payer: Self-pay | Admitting: Specialist

## 2015-03-01 DIAGNOSIS — M25562 Pain in left knee: Secondary | ICD-10-CM

## 2015-03-03 ENCOUNTER — Ambulatory Visit
Admission: RE | Admit: 2015-03-03 | Discharge: 2015-03-03 | Disposition: A | Discharge status: Home / Self Care | Payer: Federal, State, Local not specified - PPO | Source: Ambulatory Visit | Attending: Specialist | Admitting: Specialist

## 2015-03-03 DIAGNOSIS — M25562 Pain in left knee: Secondary | ICD-10-CM

## 2015-03-05 ENCOUNTER — Other Ambulatory Visit: Payer: Self-pay

## 2015-03-05 ENCOUNTER — Ambulatory Visit
Admission: EM | Admit: 2015-03-05 | Discharge: 2015-03-05 | Disposition: A | Discharge status: Home / Self Care | Payer: Federal, State, Local not specified - PPO | Attending: Family Medicine | Admitting: Family Medicine

## 2015-03-05 DIAGNOSIS — R7309 Other abnormal glucose: Secondary | ICD-10-CM

## 2015-03-05 DIAGNOSIS — X58XXXS Exposure to other specified factors, sequela: Secondary | ICD-10-CM | POA: Diagnosis not present

## 2015-03-05 DIAGNOSIS — R079 Chest pain, unspecified: Secondary | ICD-10-CM | POA: Diagnosis present

## 2015-03-05 DIAGNOSIS — R51 Headache: Secondary | ICD-10-CM | POA: Diagnosis not present

## 2015-03-05 DIAGNOSIS — R002 Palpitations: Secondary | ICD-10-CM | POA: Diagnosis not present

## 2015-03-05 DIAGNOSIS — R112 Nausea with vomiting, unspecified: Secondary | ICD-10-CM | POA: Insufficient documentation

## 2015-03-05 DIAGNOSIS — S8992XS Unspecified injury of left lower leg, sequela: Secondary | ICD-10-CM | POA: Diagnosis not present

## 2015-03-05 DIAGNOSIS — R519 Headache, unspecified: Secondary | ICD-10-CM

## 2015-03-05 DIAGNOSIS — R739 Hyperglycemia, unspecified: Secondary | ICD-10-CM | POA: Insufficient documentation

## 2015-03-05 LAB — COMPREHENSIVE METABOLIC PANEL
ALK PHOS: 65 U/L (ref 38–126)
ALT: 16 U/L (ref 14–54)
AST: 20 U/L (ref 15–41)
Albumin: 3.9 g/dL (ref 3.5–5.0)
Anion gap: 7 (ref 5–15)
BUN: 17 mg/dL (ref 6–20)
CO2: 26 mmol/L (ref 22–32)
Calcium: 9.4 mg/dL (ref 8.9–10.3)
Chloride: 103 mmol/L (ref 101–111)
Creatinine, Ser: 0.71 mg/dL (ref 0.44–1.00)
GFR calc Af Amer: >60 mL/min (ref >60)
Glucose, Bld: 166 mg/dL — ABNORMAL HIGH (ref 65–99)
POTASSIUM: 3.9 mmol/L (ref 3.5–5.1)
Sodium: 136 mmol/L (ref 135–145)
Total Bilirubin: 0.5 mg/dL (ref 0.3–1.2)
Total Protein: 6.9 g/dL (ref 6.5–8.1)

## 2015-03-05 LAB — CBC WITH DIFFERENTIAL/PLATELET
BASOS PCT: 1 %
Basophils Absolute: 0.1 10*3/uL (ref 0–0.1)
EOS ABS: 0.1 10*3/uL (ref 0–0.7)
Eosinophils Relative: 1 %
HCT: 36.3 % (ref 35.0–47.0)
HEMOGLOBIN: 11.8 g/dL — AB (ref 12.0–16.0)
Lymphocytes Relative: 17 %
Lymphs Abs: 1.1 10*3/uL (ref 1.0–3.6)
MCH: 25.7 pg — ABNORMAL LOW (ref 26.0–34.0)
MCHC: 32.5 g/dL (ref 32.0–36.0)
MCV: 79.2 fL — AB (ref 80.0–100.0)
MONOS PCT: 5 %
Monocytes Absolute: 0.3 10*3/uL (ref 0.2–0.9)
NEUTROS PCT: 76 %
Neutro Abs: 4.8 10*3/uL (ref 1.4–6.5)
Platelets: 166 10*3/uL (ref 150–440)
RBC: 4.59 MIL/uL (ref 3.80–5.20)
RDW: 14.7 % — ABNORMAL HIGH (ref 11.5–14.5)
WBC: 6.3 10*3/uL (ref 3.6–11.0)

## 2015-03-05 MED ORDER — KETOROLAC TROMETHAMINE 60 MG/2ML IM SOLN: 60.0000 mg | Freq: Once | INTRAMUSCULAR | Status: DC

## 2015-03-05 MED ORDER — ONDANSETRON 8 MG PO TBDP: 8.0000 mg | ORAL_TABLET | Freq: Three times a day (TID) | ORAL | Status: DC | PRN

## 2015-03-05 MED ORDER — ONDANSETRON 8 MG PO TBDP
8.0000 mg | ORAL_TABLET | Freq: Once | ORAL | Status: AC
  Administered 2015-03-05: 8 mg via ORAL

## 2015-03-05 NOTE — Discharge Instructions (Signed)
General Headache Without Cause A general headache is pain or discomfort felt around the head or neck area. The cause may not be found.  HOME CARE   Keep all doctor visits.  Only take medicines as told by your doctor.  Lie down in a dark, quiet room when you have a headache.  Keep a journal to find out if certain things bring on headaches. For example, write down:  What you eat and drink.  How much sleep you get.  Any change to your diet or medicines.  Relax by getting a massage or doing other relaxing activities.  Put ice or heat packs on the head and neck area as told by your doctor.  Lessen stress.  Sit up straight. Do not tighten (tense) your muscles.  Quit smoking if you smoke.  Lessen how much alcohol you drink.  Lessen how much caffeine you drink, or stop drinking caffeine.  Eat and sleep on a regular schedule.  Get 7 to 9 hours of sleep, or as told by your doctor.  Keep lights dim if bright lights bother you or make your headaches worse. GET HELP RIGHT AWAY IF:   Your headache becomes really bad.  You have a fever.  You have a stiff neck.  You have trouble seeing.  Your muscles are weak, or you lose muscle control.  You lose your balance or have trouble walking.  You feel like you will pass out (faint), or you pass out.  You have really bad symptoms that are different than your first symptoms.  You have problems with the medicines given to you by your doctor.  Your medicines do not work.  Your headache feels different than the other headaches.  You feel sick to your stomach (nauseous) or throw up (vomit). MAKE SURE YOU:   Understand these instructions.  Will watch your condition.  Will get help right away if you are not doing well or get worse. Document Released: 06/25/2008 Document Revised: 12/09/2011 Document Reviewed: 09/06/2011 [facility] Patient Information 2015 [place], [place]. This information is not intended to replace advice given to  you by your health care provider. Make sure you discuss any questions you have with your health care provider.  High Blood Sugar High blood sugar (hyperglycemia) means that the level of sugar in your blood is higher than it should be. Signs of high blood sugar include:  Feeling thirsty.  Frequent peeing (urinating).  Feeling tired or sleepy.  Dry mouth.  Vision changes.  Feeling weak.  Feeling hungry but losing weight.  Numbness and tingling in your hands or feet.  Headache. When you ignore these signs, your blood sugar may keep going up. These problems may get worse, and other problems may begin. HOME CARE  Check your blood sugars as told by your doctor. Write down the numbers with the date and time.  Take the right amount of insulin or diabetes pills at the right time. Write down the dose with date and time.  Refill your insulin or diabetes pills before running out.  Watch what you eat. Follow your meal plan.  Drink liquids without sugar, such as water. Check with your doctor if you have kidney or heart disease.  Follow your doctor's orders for exercise. Exercise at the same time of day.  Keep your doctor's appointments. GET HELP RIGHT AWAY IF:   You have trouble thinking or are confused.  You have fast breathing with fruity smelling breath.  You pass out (faint).  You have 2  to 3 days of high blood sugars and you do not know why.  You have chest pain.  You are feeling sick to your stomach (nauseous) or throwing up (vomiting).  You have sudden vision changes. MAKE SURE YOU:   Understand these instructions.  Will watch your condition.  Will get help right away if you are not doing well or get worse. Document Released: 07/14/2009 Document Revised: 12/09/2011 Document Reviewed: 07/14/2009 [facility] Patient Information 2015 [place], [place]. This information is not intended to replace advice given to you by your health care provider. Make sure you discuss any  questions you have with your health care provider.  Nausea and Vomiting Nausea is a sick feeling that often comes before throwing up (vomiting). Vomiting is a reflex where stomach contents come out of your mouth. Vomiting can cause severe loss of body fluids (dehydration). Children and elderly adults can become dehydrated quickly, especially if they also have diarrhea. Nausea and vomiting are symptoms of a condition or disease. It is important to find the cause of your symptoms. CAUSES   Direct irritation of the stomach lining. This irritation can result from increased acid production (gastroesophageal reflux disease), infection, food poisoning, taking certain medicines (such as nonsteroidal anti-inflammatory drugs), alcohol use, or tobacco use.  Signals from the brain.These signals could be caused by a headache, heat exposure, an inner ear disturbance, increased pressure in the brain from injury, infection, a tumor, or a concussion, pain, emotional stimulus, or metabolic problems.  An obstruction in the gastrointestinal tract (bowel obstruction).  Illnesses such as diabetes, hepatitis, gallbladder problems, appendicitis, kidney problems, cancer, sepsis, atypical symptoms of a heart attack, or eating disorders.  Medical treatments such as chemotherapy and radiation.  Receiving medicine that makes you sleep (general anesthetic) during surgery. DIAGNOSIS Your caregiver may ask for tests to be done if the problems do not improve after a few days. Tests may also be done if symptoms are severe or if the reason for the nausea and vomiting is not clear. Tests may include:  Urine tests.  Blood tests.  Stool tests.  Cultures (to look for evidence of infection).  X-rays or other imaging studies. Test results can help your caregiver make decisions about treatment or the need for additional tests. TREATMENT You need to stay well hydrated. Drink frequently but in small amounts.You may wish to  drink water, sports drinks, clear broth, or eat frozen ice pops or gelatin dessert to help stay hydrated.When you eat, eating slowly may help prevent nausea.There are also some antinausea medicines that may help prevent nausea. HOME CARE INSTRUCTIONS   Take all medicine as directed by your caregiver.  If you do not have an appetite, do not force yourself to eat. However, you must continue to drink fluids.  If you have an appetite, eat a normal diet unless your caregiver tells you differently.  Eat a variety of complex carbohydrates (rice, wheat, potatoes, bread), lean meats, yogurt, fruits, and vegetables.  Avoid high-fat foods because they are more difficult to digest.  Drink enough water and fluids to keep your urine clear or pale yellow.  If you are dehydrated, ask your caregiver for specific rehydration instructions. Signs of dehydration may include:  Severe thirst.  Dry lips and mouth.  Dizziness.  Dark urine.  Decreasing urine frequency and amount.  Confusion.  Rapid breathing or pulse. SEEK IMMEDIATE MEDICAL CARE IF:   You have blood or brown flecks (like coffee grounds) in your vomit.  You have black or bloody  stools.  You have a severe headache or stiff neck.  You are confused.  You have severe abdominal pain.  You have chest pain or trouble breathing.  You do not urinate at least once every 8 hours.  You develop cold or clammy skin.  You continue to vomit for longer than 24 to 48 hours.  You have a fever. MAKE SURE YOU:   Understand these instructions.  Will watch your condition.  Will get help right away if you are not doing well or get worse. Document Released: 09/16/2005 Document Revised: 12/09/2011 Document Reviewed: 02/13/2011 [facility] Patient Information 2015 [place], [place]. This information is not intended to replace advice given to you by your health care provider. Make sure you discuss any questions you have with your health care  provider.

## 2015-03-05 NOTE — ED Provider Notes (Signed)
CSN: [id number]     Arrival date & time 03/05/15  0844 History   First MD Initiated Contact with Patient 03/05/15 [phone]     Chief Complaint  Patient presents with  . Headache  . Chest Pain   (Consider location/radiation/quality/duration/timing/severity/associated sxs/prior Treatment) Patient is a 59 y.o. female presenting with headaches and chest pain. The history is provided by the patient and the spouse. No language interpreter was used.  Headache Pain location:  Generalized Quality:  Sharp Radiates to:  Does not radiate Severity currently:  8/10 Severity at highest:  10/10 Onset quality:  Sudden Duration:  4 hours Timing:  Constant Progression:  Worsening Chronicity:  Recurrent Similar to prior headaches: yes   Associated symptoms: dizziness, nausea, vomiting and weakness   Chest Pain Associated symptoms: dizziness, headache, nausea, palpitations, vomiting and weakness    They state that the meal that he last night she didn't care for it however she did not eat much of it and they later ordered buffalo wings in their hotel room later that night. The husband reports eating the same food and was fine and he is also talked to some of the people who were with them and none of them had any trouble.  She had a MRI last week and do not have the results yet. Her husband was curious if any of this could be the culprit.  Past Medical History  Diagnosis Date  . Neck pain   . Hypertension   . High cholesterol   . Fibroids   . Transient cerebral ischemia     10-22-12 ED, tia s/s but negative work up in ED. see note   . Allergic rhinitis   . GERD (gastroesophageal reflux disease)   . Carpal tunnel syndrome     right  . Anxiety   . Hemorrhoids   . Migraine   . Endometriosis   . Cervical dysplasia     1996  . Abnormal glucose   . Hyperlipemia   . Arthritis    Past Surgical History  Procedure Laterality Date  . Abdominal hysterectomy  Partial  . Cold knife cone      for  dysplasia  . Tubal ligation    . Carpal tunnel release Right   . Trigger finger release      thumb, middle finger   . Colonoscopy      last 11-2008 w/patterson, hems only    Family History  Problem Relation Age of Onset  . Colon cancer Father   . Stomach cancer Father   . Diabetes Mother   . Hypertension Mother   . CVA Mother   . Kidney disease Mother   . Colon cancer Maternal Uncle   . Breast cancer Maternal Aunt   . Kidney disease Maternal Aunt   . Heart disease Brother   . Throat cancer Maternal Uncle   . Rectal cancer Neg Hx    History  Substance Use Topics  . Smoking status: Never Smoker   . Smokeless tobacco: Never Used  . Alcohol Use: 0.6 oz/week    1 Glasses of wine per week     Comment: occasionally   OB History    No data available     Review of Systems  Respiratory: Positive for chest tightness.   Cardiovascular: Positive for chest pain and palpitations.  Gastrointestinal: Positive for nausea and vomiting.  Musculoskeletal: Positive for joint swelling and gait problem.       Knee pain she fell down some stairs about  3 weeks ago  Neurological: Positive for dizziness, weakness and headaches.    Allergies  Sulfa antibiotics  Home Medications   Prior to Admission medications   Medication Sig Start Date End Date Taking? Authorizing Provider  aspirin 81 MG chewable tablet Chew 81 mg by mouth daily.   Yes Historical Provider, MD  Aspirin-Acetaminophen-Caffeine (GOODY HEADACHE PO) Take 1 packet by mouth daily as needed. For pain   Yes Historical Provider, MD  naproxen sodium (ANAPROX) 220 MG tablet Take 220 mg by mouth 2 (two) times daily with a meal.   Yes Historical Provider, MD  rosuvastatin (CRESTOR) 5 MG tablet Take 5 mg by mouth daily.   Yes Historical Provider, MD  triamterene-hydrochlorothiazide (MAXZIDE-25) 37.5-25 MG per tablet Take 1 tablet by mouth daily.   Yes Historical Provider, MD  cholecalciferol (VITAMIN D) 1000 UNITS tablet Take 2,000 Units  by mouth daily.    Historical Provider, MD  cyclobenzaprine (FLEXERIL) 10 MG tablet Take 10 mg by mouth 3 (three) times daily as needed. For headache    Historical Provider, MD  Multiple Vitamin (MULTIVITAMIN WITH MINERALS) TABS Take 1 tablet by mouth daily.    Historical Provider, MD  naproxen sodium (ANAPROX) 220 MG tablet Take 440 mg by mouth 2 (two) times daily as needed. For pain    Historical Provider, MD  ondansetron (ZOFRAN ODT) 8 MG disintegrating tablet Take 1 tablet (8 mg total) by mouth every 8 (eight) hours as needed for nausea or vomiting. 03/05/15   Frederich Cha, MD   BP 141/90 mmHg  Pulse 66  Temp(Src) 97.3 F (36.3 C) (Tympanic)  Resp 16  Ht 5\' 2"  (1.575 m)  Wt 165 lb (74.844 kg)  BMI 30.17 kg/m2  SpO2 94% Physical Exam  Constitutional: She is oriented to person, place, and time. She appears well-developed and well-nourished. She appears distressed.  HENT:  Head: Normocephalic and atraumatic.  Nose: Nose normal.  Mouth/Throat: Oropharynx is clear and moist.  Eyes: Pupils are equal, round, and reactive to light.  Neck: Normal range of motion. Neck supple. No tracheal deviation present. No thyromegaly present.  Cardiovascular: Normal rate, regular rhythm and normal heart sounds.  Exam reveals no gallop.   No murmur heard. Pulmonary/Chest: Effort normal and breath sounds normal. No respiratory distress.  Abdominal: Soft. Bowel sounds are normal. She exhibits no distension. There is no tenderness.  Musculoskeletal: Normal range of motion. She exhibits no edema.  Lymphadenopathy:    She has no cervical adenopathy.  Neurological: She is oriented to person, place, and time.  Skin: Skin is warm and dry. No erythema.  Psychiatric: Her behavior is normal.  Vitals reviewed.     EKG showed normal sinus rhythm. That she states were occurring while having the EKG not transmitted on strip. ED Course  Procedures (including critical care time) Labs Review Labs Reviewed   COMPREHENSIVE METABOLIC PANEL - Abnormal; Notable for the following:    Glucose, Bld 166 (*)    All other components within normal limits  CBC WITH DIFFERENTIAL/PLATELET - Abnormal; Notable for the following:    Hemoglobin 11.8 (*)    MCV 79.2 (*)    MCH 25.7 (*)    RDW 14.7 (*)    All other components within normal limits    Imaging Review Mr Knee Left  Wo Contrast  03/04/2015   CLINICAL DATA:  [place] down steps at home 2 months ago. Persistent pain.  EXAM: MRI OF THE LEFT KNEE WITHOUT CONTRAST  TECHNIQUE: Multiplanar, multisequence MR  imaging of the knee was performed. No intravenous contrast was administered.  COMPARISON:  None.  FINDINGS: MENISCI  Medial meniscus: Oblique coursing inferior articular surface tear involving the posterior horn near the meniscal root. No complete detachment or medial protrusion of the meniscus. There is also a small peripheral inferior articular surface flap type tear with a small meniscal fragment in the medial gutter.  Lateral meniscus:  Intact.  LIGAMENTS  Cruciates:  Intact.  Collaterals:  Intact.  Mild MCL and pes anserine bursitis.  CARTILAGE  Patellofemoral: Moderate to advanced degenerative chondrosis with areas of grade 3 and grade 4 chondromalacia. This is most significant along the patellar apex and medial facet.  Medial:  Mild degenerative chondrosis.  Lateral:  Mild degenerative chondrosis.  Joint: Small joint effusion and mild synovitis. Superior and medial patellar plica are noted.  Popliteal Fossa:  Small leaking Baker's cyst.  Extensor Mechanism: The patella retinacular structures are intact and the quadriceps and patellar tendons are intact.  Bones:  No acute bony findings.  No osteochondral lesion.  IMPRESSION: 1. Medial meniscus tears as described above. 2. Intact ligamentous structures and no acute bony findings. 3. Tricompartmental degenerative changes most significant at the patellofemoral joint. 4. Mild MCL and pes anserine bursitis. 5. Small  joint effusion with mild synovitis and superior and medial patellar plica. 6. Small leaking Baker's cyst.   Electronically Signed   By: Marijo Sanes M.D.   On: 03/04/2015 11:09   Lab work is pending. EKG is fine. Since giving the Zofran and 02 she started to feel better. Will administer Toradol IM obtained CBC and CMP and observe patient to make sure that she is doing better.   Patient is doing much better. Copy of MRI given the patient. Informed about elevated blood sugar. Patient declined Toradol injection but headache is clearly gone since being on oxygen. Recommend follow-up with her doctor sometime next week. Zofran given for nausea.`    MDM   1. Acute nonintractable headache, unspecified headache type   2. Non-intractable vomiting with nausea, vomiting of unspecified type   3. Elevated blood sugar   4. Palpitations   5. Left knee injury, sequela        Frederich Cha, MD 03/05/15 1143

## 2015-03-05 NOTE — ED Notes (Signed)
Started at 5:30am with "terrible headache". Took Aleve and 1 hr. Took BC Powder with no effect.Following that, felt like "going to pass out-chest tightness, nausea and throat tight". States chest fluttering."Worst headache I've ever had"

## 2015-03-05 NOTE — ED Notes (Signed)
Family at bedside.Patient states "headache completely gone", and refuses Toradol. Denies chest pain also

## 2015-05-31 ENCOUNTER — Encounter: Payer: Self-pay | Admitting: Neurology

## 2015-05-31 ENCOUNTER — Ambulatory Visit (INDEPENDENT_AMBULATORY_CARE_PROVIDER_SITE_OTHER): Payer: Federal, State, Local not specified - PPO | Admitting: Neurology

## 2015-05-31 VITALS — BP 140/86 | HR 80 | Resp 22 | Ht 62.0 in | Wt 171.5 lb

## 2015-05-31 DIAGNOSIS — G44099 Other trigeminal autonomic cephalgias (TAC), not intractable: Secondary | ICD-10-CM | POA: Diagnosis not present

## 2015-05-31 DIAGNOSIS — R51 Headache: Secondary | ICD-10-CM | POA: Diagnosis not present

## 2015-05-31 DIAGNOSIS — M47812 Spondylosis without myelopathy or radiculopathy, cervical region: Secondary | ICD-10-CM | POA: Diagnosis not present

## 2015-05-31 DIAGNOSIS — G43009 Migraine without aura, not intractable, without status migrainosus: Secondary | ICD-10-CM | POA: Diagnosis not present

## 2015-05-31 DIAGNOSIS — G4486 Cervicogenic headache: Secondary | ICD-10-CM | POA: Insufficient documentation

## 2015-05-31 DIAGNOSIS — Z8249 Family history of ischemic heart disease and other diseases of the circulatory system: Secondary | ICD-10-CM | POA: Insufficient documentation

## 2015-05-31 NOTE — Patient Instructions (Addendum)
We will refer you to Dr. Hulan Saas for neck and headache physical therapy Consider daily preventative medication.  If the severe headaches are Cluster headaches, may consider home oxygen when you get it. Follow up in 3 months.

## 2015-05-31 NOTE — Progress Notes (Signed)
NEUROLOGY CONSULTATION NOTE  ALESA ECHEVARRIA MRN: [id number] DOB: 05-07-1956  Referring provider: Dr. Laurann Montana Primary care provider: Dr. Laurann Montana  Reason for consult:  headache  HISTORY OF PRESENT ILLNESS: Dawn [name] is a 59 year old right-handed female with hypertension and hyperlipidemia who presents for headache.  History obtained by patient and PCP note.  Labs reviewed.  Images of MRI/MRA brain from 2014 and cervical MRI from October 2015 personally reviewed.  Onset:  Many years ago Location:  Right side.  It starts in right frontal region and radiates back to down the right side of her neck Quality:  aching Intensity:  Dull but she has severe episodes that are 7/10 Aura:  no Prodrome:  no Associated symptoms:  When severe, she reports some nausea and vomiting, as well as photophobia and phonophobia.  She notes that both eyes tear. Duration:  Several hours Frequency:  5 days per week but severe episodes occur 2 to 3 days per month.  She typically wakes up with them. Triggers/exacerbating factors:  Holding head down or sleeping on right side. Relieving factors:  none  Past abortive medication:  Goody, BC powder, Excedrin.  One time, O2 worked. Past preventative medication:  none Other past therapy:  none  Current abortive medication:  Aleve, Flexeril Antihypertensive medications:  Maxzide Antidepressant medications:  none Anticonvulsant medications:  None  She had a severe episode in June.  She went to Urgent care where she was given O2, which resolved the headache.  Her mother had cerebral aneurysm which caused stroke.  On 10/22/12 she had an MRI and MRA of the head, which showed mild small vessel ischemic changes but no acute stroke, bleed, mass lesion or focal intracranial stenosis. She had an MRI of cervical spine on 07/23/14, which showed progressed cervical spondylosis and degenerative disc disease with prominent impingement at C6-7, moderate impingement at C5-6  and mild impingement at C3-4 and C4-5.  She saw a surgeon who said there was nothing surgical to perform.  PAST MEDICAL HISTORY: Past Medical History  Diagnosis Date  . Neck pain   . Hypertension   . High cholesterol   . Fibroids   . Transient cerebral ischemia     10-22-12 ED, tia s/s but negative work up in ED. see note   . Allergic rhinitis   . GERD (gastroesophageal reflux disease)   . Carpal tunnel syndrome     right  . Anxiety   . Hemorrhoids   . Migraine   . Endometriosis   . Cervical dysplasia     1996  . Abnormal glucose   . Hyperlipemia   . Arthritis     PAST SURGICAL HISTORY: Past Surgical History  Procedure Laterality Date  . Abdominal hysterectomy  Partial  . Cold knife cone      for dysplasia  . Tubal ligation    . Carpal tunnel release Right   . Trigger finger release      thumb, middle finger   . Colonoscopy      last 11-2008 w/[facility], [facility] only     MEDICATIONS: Current Outpatient Prescriptions on File Prior to Visit  Medication Sig Dispense Refill  . aspirin 81 MG chewable tablet Chew 81 mg by mouth daily.    . Aspirin-Acetaminophen-Caffeine (GOODY HEADACHE PO) Take 1 packet by mouth daily as needed. For pain    . cholecalciferol (VITAMIN D) 1000 UNITS tablet Take 2,000 Units by mouth daily.    . cyclobenzaprine (FLEXERIL) 10 MG tablet Take 10  mg by mouth 3 (three) times daily as needed. For headache    . Multiple Vitamin (MULTIVITAMIN WITH MINERALS) TABS Take 1 tablet by mouth daily.    . naproxen sodium (ANAPROX) 220 MG tablet Take 440 mg by mouth 2 (two) times daily as needed. For pain    . naproxen sodium (ANAPROX) 220 MG tablet Take 220 mg by mouth 2 (two) times daily with a meal.    . rosuvastatin (CRESTOR) 5 MG tablet Take 5 mg by mouth daily.    Marland Kitchen triamterene-hydrochlorothiazide (MAXZIDE-25) 37.5-25 MG per tablet Take 1 tablet by mouth daily.    . ondansetron (ZOFRAN ODT) 8 MG disintegrating tablet Take 1 tablet (8 mg total) by mouth every  8 (eight) hours as needed for nausea or vomiting. (Patient not taking: Reported on 05/31/2015) 20 tablet 0   No current facility-administered medications on file prior to visit.    ALLERGIES: Allergies  Allergen Reactions  . Sulfa Antibiotics Rash    FAMILY HISTORY: Family History  Problem Relation Age of Onset  . Colon cancer Father   . Stomach cancer Father   . Diabetes Mother   . Hypertension Mother   . CVA Mother   . Kidney disease Mother   . Colon cancer Maternal Uncle   . Breast cancer Maternal Aunt   . Kidney disease Maternal Aunt   . Heart disease Brother   . Throat cancer Maternal Uncle   . Rectal cancer Neg Hx   . Aneurysm Mother     SOCIAL HISTORY: Social History   Social History  . Marital Status: Married    Spouse Name: N/A  . Number of Children: 1  . Years of Education: N/A   Occupational History  . Retired    Social History Main Topics  . Smoking status: Never Smoker   . Smokeless tobacco: Never Used  . Alcohol Use: 0.6 oz/week    1 Glasses of wine per week     Comment: occasionally  . Drug Use: No  . Sexual Activity: No   Other Topics Concern  . Not on file   Social History Narrative    REVIEW OF SYSTEMS: Constitutional: No fevers, chills, or sweats, no generalized fatigue, change in appetite Eyes: No visual changes, double vision, eye pain Ear, nose and throat: No hearing loss, ear pain, nasal congestion, sore throat Cardiovascular: No chest pain, palpitations Respiratory:  No shortness of breath at rest or with exertion, wheezes GastrointestinaI: No nausea, vomiting, diarrhea, abdominal pain, fecal incontinence Genitourinary:  No dysuria, urinary retention or frequency Musculoskeletal:  Right sided neck pain Integumentary: No rash, pruritus, skin lesions Neurological: as above Psychiatric: No depression, insomnia, anxiety Endocrine: No palpitations, fatigue, diaphoresis, mood swings, change in appetite, change in weight, increased  thirst Hematologic/Lymphatic:  No anemia, purpura, petechiae. Allergic/Immunologic: no itchy/runny eyes, nasal congestion, recent allergic reactions, rashes  PHYSICAL EXAM: Filed Vitals:   05/31/15 0839  BP: 140/86  Pulse: 80  Resp: 22   General: No acute distress.  Patient appears well-groomed.  Head:  Normocephalic/atraumatic Eyes:  fundi unremarkable, without vessel changes, exudates, hemorrhages or papilledema. Neck: supple, no paraspinal tenderness, full range of motion Back: No paraspinal tenderness Heart: regular rate and rhythm Lungs: Clear to auscultation bilaterally. Vascular: No carotid bruits. Neurological Exam: Mental status: alert and oriented to person, place, and time, recent and remote memory intact, fund of knowledge intact, attention and concentration intact, speech fluent and not dysarthric, language intact. Cranial nerves: CN I: not tested CN II:  pupils equal, round and reactive to light, visual fields intact, fundi unremarkable, without vessel changes, exudates, hemorrhages or papilledema. CN III, IV, VI:  full range of motion, no nystagmus, slight right ptosis (she says this is a chronic issue) CN V: facial sensation intact CN VII: upper and lower face symmetric CN VIII: hearing intact CN IX, X: gag intact, uvula midline CN XI: sternocleidomastoid and trapezius muscles intact CN XII: tongue midline Bulk & Tone: normal, no fasciculations. Motor:  5/5 throughout Sensation:  temperature and vibration sensation intact. Deep Tendon Reflexes:  2+ throughout, toes downgoing.  Finger to nose testing:  Without dysmetria.  Heel to shin:  Without dysmetria.  Gait:  Right limp due to recent injury.  Able to turn and tandem walk. Romberg negative.  IMPRESSION: She may have two types of headaches. 1.  She appears to have cervicogenic headaches/migraines, which I feel are related to her cervical spondylosis. 2.  However, she also has severe headaches, one of which  was successfully treated with O2.  This is typically seen in Cluster headaches, although she doesn't have a definite classic presentation. 3. Family history of cerebral aneurysm.  She has had MRI/MRA of the head which did not reveal aneurysm or intracranial abnormality.  PLAN: 1.  At this point, she would like to avoid trying medication (such as verapamil or other agent like nortriptyline).  Therefore, we will send her to Dr. Hulan Saas for OMM. 2.  She will follow up in 3 months with update and determine further management if needed.  Thank you for allowing me to take part in the care of this patient.  Metta Clines, DO  CC:  Kelton Pillar, MD

## 2015-06-07 ENCOUNTER — Ambulatory Visit (HOSPITAL_COMMUNITY): Payer: Federal, State, Local not specified - PPO

## 2015-06-28 ENCOUNTER — Encounter: Payer: Self-pay | Admitting: Family Medicine

## 2015-06-28 ENCOUNTER — Ambulatory Visit (INDEPENDENT_AMBULATORY_CARE_PROVIDER_SITE_OTHER): Payer: Federal, State, Local not specified - PPO | Admitting: Family Medicine

## 2015-06-28 VITALS — BP 132/86 | HR 83 | Ht 62.0 in | Wt 172.0 lb

## 2015-06-28 DIAGNOSIS — M9901 Segmental and somatic dysfunction of cervical region: Secondary | ICD-10-CM | POA: Diagnosis not present

## 2015-06-28 DIAGNOSIS — M999 Biomechanical lesion, unspecified: Secondary | ICD-10-CM | POA: Insufficient documentation

## 2015-06-28 DIAGNOSIS — M9908 Segmental and somatic dysfunction of rib cage: Secondary | ICD-10-CM | POA: Diagnosis not present

## 2015-06-28 DIAGNOSIS — R51 Headache: Secondary | ICD-10-CM | POA: Diagnosis not present

## 2015-06-28 DIAGNOSIS — M9902 Segmental and somatic dysfunction of thoracic region: Secondary | ICD-10-CM | POA: Diagnosis not present

## 2015-06-28 DIAGNOSIS — G4486 Cervicogenic headache: Secondary | ICD-10-CM

## 2015-06-28 NOTE — Progress Notes (Signed)
Pre visit review using our clinic review tool, if applicable. No additional management support is needed unless otherwise documented below in the visit note. 

## 2015-06-28 NOTE — Progress Notes (Signed)
[name] [address], [place] [address] Phone: [phone] Subjective:    I'm seeing this patient by the request  of:  Tomi Likens, MD   CC: migraines and cervicogenic headache  BJS:[id number] Dawn [name] is a 59 y.o. female coming in with complaint of migraines with a cervicogenic headache. Patient was seen by neurology and was referred here for the potential for osteopathic manipulation. Patient states that she has had headaches for multiple years. Seems to be mostly on the right side usually occurs in the frontal region and radiates back towards the right side of her neck. Describes the pain as more of a dull aching sensation thick and have severity as bad as 7 out of 10. Patient states when severe can have nausea and vomiting as well as photophobia. Can last several hours and seems to be happening multiple days a week.she was diagnosed with more of a potential cluster headache recently.patient is notices the headaches seemed to be worse in the morning especially if she sleeps on her right side. Otherwise does not notice any association with any significant activity except sitting in one position for too long.   patient past medical history significant for cervical spondylosis. This was seen on MRI in October 2015 which was reviewed by me.patient does have an MRI of the brain pending  Past medical history, social, surgical and family history all reviewed in electronic medical record.   Review of Systems: No headache, visual changes, nausea, vomiting, diarrhea, constipation, dizziness, abdominal pain, skin rash, fevers, chills, night sweats, weight loss, swollen lymph nodes, body aches, joint swelling, muscle aches, chest pain, shortness of breath, mood changes.   Objective Blood pressure 132/86, pulse 83, height 5\' 2"  (1.575 m), weight 172 lb (78.019 kg), SpO2 97 %.  General: No apparent distress alert and oriented x3 mood and affect normal, dressed  appropriately.  HEENT: Pupils equal, extraocular movements intact  Respiratory: Patient's speak in full sentences and does not appear short of breath  Cardiovascular: No lower extremity edema, non tender, no erythema  Skin: Warm dry intact with no signs of infection or rash on extremities or on axial skeleton.  Abdomen: Soft nontender  Neuro: Cranial nerves II through XII are intact, neurovascularly intact in all extremities with 2+ DTRs and 2+ pulses.  Lymph: No lymphadenopathy of posterior or anterior cervical chain or axillae bilaterally.  Gait normal with good balance and coordination.  MSK:  Non tender with full range of motion and good stability and symmetric strength and tone of shoulders, elbows, wrist, hip, knee and ankles bilaterally.  Neck: Mild increase in kyphosis of the upperback No palpable stepoffs. Negative Spurling's maneuver. Lacks last 5 of rotation and side bending on the right side Grip strength and sensation normal in bilateral hands Strength good C4 to T1 distribution No sensory change to C4 to T1 Negative Hoffman sign bilaterally Reflexes normal  Osteopathic findings C2 flexed rotated and side bent right C4 flexed rotated and side bent left T1 extended rotated and side bent right with elevated first rib T3 extended rotated and side bent right T7 extended rotated and side bent right L2 flexed rotated and side bent left Sacrum right on right  Procedure note 97110; 15 minutes spent for Therapeutic exercises as stated in above notes.  This included exercises focusing on stretching, strengthening, with significant focus on eccentric aspects. Basic scapular stabilization to include adduction and depression of scapula Scaption, focusing on proper movement and good control  Internal and External rotation utilizing a theraband, with elbow tucked at side entire time Rows with theraband  Proper technique shown and discussed handout in great detail with ATC.  All  questions were discussed and answered.     Impression and Recommendations:     This case required medical decision making of moderate complexity.

## 2015-06-28 NOTE — Assessment & Plan Note (Signed)
Decision today to treat with OMT was based on Physical Exam  After verbal consent patient was treated with HVLA, ME, FPR techniques in cervical, thoracic, and rib areas  Patient tolerated the procedure well with improvement in symptoms  Patient given exercises, stretches and lifestyle modifications  See medications in patient instructions if given  Patient will follow up in 3-4 weeks

## 2015-06-28 NOTE — Assessment & Plan Note (Signed)
Patient does have a cervicogenic headaches at [facility]. We did discuss that iron deficiency could also be contributing. Patient will try some supplementation. We discussed proper sleeping position. We also discussed different ergonomics and postural changes that can be beneficial throughout the day. Patient given home exercises, postural exercises focusing on scapular strengthening. Work with Product/process development scientist today. We discussed icing regimen. Patient will try to make these changes and hopefully we will have a decrease in frequency as well as severity of the headaches and patient will come back and see me again in 3 weeks.

## 2015-06-28 NOTE — Patient Instructions (Addendum)
Good to see you.  You are iron deficient and would start iron 65mg  of elemental iron daily  Vitamin D 2000 IU daily Turmeric 500mg  twice daily On wall with heels, butt shoulder and head touching for a goal of 5 minutes daily  2 tennis ball in tube sock and lay on them where head meets neck.  See me again in 3 weeks.  Standing:  Secure a rubber exercise band/tubing so that it is at the height of your shoulders when you are either standing or sitting on a firm arm-less chair.  Grasp an end of the band/tubing in each hand and have your palms face each other. Straighten your elbows and lift your hands straight in front of you at shoulder height. Step back away from the secured end of band/tubing until it becomes tense.  Squeeze your shoulder blades together. Keeping your elbows locked and your hands at shoulder-height, bring your hands out to your side.  Hold __________ seconds. Slowly ease the tension on the band/tubing as you reverse the directions and return to the starting position. Repeat __________ times. Complete this exercise __________ times per day. STRENGTH - Scapular Retractors  Secure a rubber exercise band/tubing so that it is at the height of your shoulders when you are either standing or sitting on a firm arm-less chair.  With a palm-down grip, grasp an end of the band/tubing in each hand. Straighten your elbows and lift your hands straight in front of you at shoulder height. Step back away from the secured end of band/tubing until it becomes tense.  Squeezing your shoulder blades together, draw your elbows back as you bend them. Keep your upper arm lifted away from your body throughout the exercise.  Hold __________ seconds. Slowly ease the tension on the band/tubing as you reverse the directions and return to the starting position. Repeat __________ times. Complete this exercise __________ times per day. STRENGTH - Shoulder Extensors   Secure a rubber exercise band/tubing  so that it is at the height of your shoulders when you are either standing or sitting on a firm arm-less chair.  With a thumbs-up grip, grasp an end of the band/tubing in each hand. Straighten your elbows and lift your hands straight in front of you at shoulder height. Step back away from the secured end of band/tubing until it becomes tense.  Squeezing your shoulder blades together, pull your hands down to the sides of your thighs. Do not allow your hands to go behind you.  Hold for __________ seconds. Slowly ease the tension on the band/tubing as you reverse the directions and return to the starting position. Repeat __________ times. Complete this exercise __________ times per day.  STRENGTH - Scapular Retractors and External Rotators  Secure a rubber exercise band/tubing so that it is at the height of your shoulders when you are either standing or sitting on a firm arm-less chair.  With a palm-down grip, grasp an end of the band/tubing in each hand. Bend your elbows 90 degrees and lift your elbows to shoulder height at your sides. Step back away from the secured end of band/tubing until it becomes tense.  Squeezing your shoulder blades together, rotate your shoulder so that your upper arm and elbow remain stationary, but your fists travel upward to head-height.  Hold __________ for seconds. Slowly ease the tension on the band/tubing as you reverse the directions and return to the starting position. Repeat __________ times. Complete this exercise __________ times per day.  STRENGTH -  Scapular Retractors and External Rotators, Rowing  Secure a rubber exercise band/tubing so that it is at the height of your shoulders when you are either standing or sitting on a firm arm-less chair.  With a palm-down grip, grasp an end of the band/tubing in each hand. Straighten your elbows and lift your hands straight in front of you at shoulder height. Step back away from the secured end of band/tubing until  it becomes tense.  Step 1: Squeeze your shoulder blades together. Bending your elbows, draw your hands to your chest as if you are rowing a boat. At the end of this motion, your hands and elbow should be at shoulder-height and your elbows should be out to your sides.  Step 2: Rotate your shoulder to raise your hands above your head. Your forearms should be vertical and your upper-arms should be horizontal.  Hold for __________ seconds. Slowly ease the tension on the band/tubing as you reverse the directions and return to the starting position. Repeat __________ times. Complete this exercise __________ times per day.  STRENGTH - Scapular Retractors and Elevators  Secure a rubber exercise band/tubing so that it is at the height of your shoulders when you are either standing or sitting on a firm arm-less chair.  With a thumbs-up grip, grasp an end of the band/tubing in each hand. Step back away from the secured end of band/tubing until it becomes tense.  Squeezing your shoulder blades together, straighten your elbows and lift your hands straight over your head.  Hold for __________ seconds. Slowly ease the tension on the band/tubing as you reverse the directions and return to the starting position. Repeat __________ times. Complete this exercise __________ times per day.  Document Released: 09/16/2005 Document Revised: 12/09/2011 Document Reviewed: 12/29/2008 [facility] Patient Information 2015 [place], [place]. This information is not intended to replace advice given to you by your health care provider. Make sure you discuss any questions you have with your health care provider.

## 2015-07-19 ENCOUNTER — Encounter: Payer: Self-pay | Admitting: Family Medicine

## 2015-07-19 ENCOUNTER — Ambulatory Visit (INDEPENDENT_AMBULATORY_CARE_PROVIDER_SITE_OTHER): Payer: Federal, State, Local not specified - PPO | Admitting: Family Medicine

## 2015-07-19 VITALS — BP 126/74 | HR 80 | Ht 62.0 in | Wt 173.0 lb

## 2015-07-19 DIAGNOSIS — R51 Headache: Secondary | ICD-10-CM | POA: Diagnosis not present

## 2015-07-19 DIAGNOSIS — M9908 Segmental and somatic dysfunction of rib cage: Secondary | ICD-10-CM

## 2015-07-19 DIAGNOSIS — M9902 Segmental and somatic dysfunction of thoracic region: Secondary | ICD-10-CM

## 2015-07-19 DIAGNOSIS — G4486 Cervicogenic headache: Secondary | ICD-10-CM

## 2015-07-19 DIAGNOSIS — M999 Biomechanical lesion, unspecified: Secondary | ICD-10-CM

## 2015-07-19 DIAGNOSIS — S83242A Other tear of medial meniscus, current injury, left knee, initial encounter: Secondary | ICD-10-CM | POA: Diagnosis not present

## 2015-07-19 DIAGNOSIS — M25562 Pain in left knee: Secondary | ICD-10-CM

## 2015-07-19 DIAGNOSIS — M9901 Segmental and somatic dysfunction of cervical region: Secondary | ICD-10-CM | POA: Diagnosis not present

## 2015-07-19 NOTE — Assessment & Plan Note (Signed)
Decision today to treat with OMT was based on Physical Exam  After verbal consent patient was treated with HVLA, ME, FPR techniques in cervical, thoracic, and rib areas  Patient tolerated the procedure well with improvement in symptoms  Patient given exercises, stretches and lifestyle modifications  See medications in patient instructions if given  Patient will follow up in 4-6 weeks

## 2015-07-19 NOTE — Patient Instructions (Signed)
Good to see you For the foot spenco orthotics "total support" Physical therapy will be calling you.  Rigid sole shoes Avoid being barefoot. Cork shoes would be great! New exercises for the knee and the foot alternating days Ice instead of heat.  pennsaid pinkie amount topically 2 times daily as needed.  Continue the posture See me again in 4-6 weeks.

## 2015-07-19 NOTE — Progress Notes (Signed)
Pre visit review using our clinic review tool, if applicable. No additional management support is needed unless otherwise documented below in the visit note. 

## 2015-07-19 NOTE — Assessment & Plan Note (Signed)
Patient does have MRI showing a meniscal tear. Patient elected try conservative therapy and patient will be sent to formal physical therapy as well as was given an injection today. Patient states that the knee felt better immediately. We discussed home exercises and patient given handout. Patient will do more of an icing protocol. Come back in 4-6 weeks for further evaluation.

## 2015-07-19 NOTE — Progress Notes (Signed)
[name] [place] [place], [place] [address] Phone: [phone] Subjective:    I'm seeing this patient by the request  of:  Tomi Likens, MD   CC: migraines and cervicogenic headache  OIN:[id number] Dawn [name] is a 59 y.o. female coming in with complaint of migraines with a cervicogenic headache. Patient was seen by neurology and was referred here for the potential for osteopathic manipulation. Patient states that she has had headaches for multiple years. Seems to be mostly on the right side usually occurs in the frontal region and radiates back towards the right side of her neck. Describes the pain as more of a dull aching sensation thick and have severity as bad as 7 out of 10. Patient states when severe can have nausea and vomiting as well as photophobia. Can last several hours and seems to be happening multiple days a week.she was diagnosed with more of a potential cluster headache recently.patient is notices the headaches seemed to be worse in the morning especially if she sleeps on her right side. Otherwise does not notice any association with any significant activity except sitting in one position for too long.  Patient is also having left knee pain. Patient has been seen by another provider and did have advanced imaging. This advanced imaging was reviewed by me. 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 Patient states to see motions seem to make it worse. Not stopping her from regular daily activities though. Rates the severity of pain a 7 out of 10. Only occur sometimes. Does not affect her when she walks.  Mild right-sided heel pain. Worse after  sitting or first steps in the morning seems to do better with activity. Has not tried any modalities. Would rate the severity of 4 out of 10. Does not remember any injury. States that he sometimes seems to make it feel better.   patient past medical history significant for cervical spondylosis. This was seen on MRI in October 2015 which was reviewed by me.patient does have an MRI of the brain pending  Past medical history, social, surgical and family history all reviewed in electronic medical record.   Review of Systems: No headache, visual changes, nausea, vomiting, diarrhea, constipation, dizziness, abdominal pain, skin rash, fevers, chills, night sweats, weight loss, swollen lymph nodes, body aches, joint swelling, muscle aches, chest pain, shortness of breath, mood changes.   Objective Blood pressure 126/74, pulse 80, height 5\' 2"  (1.575 m), weight 173 lb (78.472 kg), SpO2 97 %.  General: No apparent distress alert and oriented x3 mood and affect normal, dressed appropriately.  HEENT: Pupils equal, extraocular movements intact  Respiratory: Patient's speak in full sentences and does not appear short of breath  Cardiovascular: No lower extremity edema, non tender, no erythema  Skin: Warm dry intact with no signs of infection or rash on extremities or on axial skeleton.  Abdomen: Soft nontender  Neuro: Cranial nerves II through XII are intact, neurovascularly intact in all extremities with 2+ DTRs and 2+ pulses.  Lymph: No lymphadenopathy of posterior or anterior cervical chain or axillae bilaterally.  Gait normal with good balance and coordination.  MSK:  Non tender with full  range of motion and good stability and symmetric strength and tone of shoulders, elbows, wrist, hip, and ankles bilaterally.  Knee: Left Normal to inspection with no erythema or effusion or obvious bony abnormalities. Tender over the medial joint line. ROM full in flexion and extension and lower leg  rotation. Ligaments with solid consistent endpoints including ACL, PCL, LCL, MCL. Positive Mcmurray's, Apley's, and Thessalonian tests. Non painful patellar compression. Patellar glide without crepitus. Patellar and quadriceps tendons unremarkable. Hamstring and quadriceps strength is normal.  Contralateral knee unremarkable   Neck: Mild increase in kyphosis of the upperback No palpable stepoffs. Negative Spurling's maneuver. Lacks last 5 of rotation and side bending on the right side Grip strength and sensation normal in bilateral hands Strength good C4 to T1 distribution No sensory change to C4 to T1 Negative Hoffman sign bilaterally Reflexes normal  Osteopathic findings C2 flexed rotated and side bent right C4 flexed rotated and side bent left T1 extended rotated and side bent right with elevated first rib T3 extended rotated and side bent right L2 flexed rotated and side bent left Sacrum right on right   After informed written and verbal consent, patient was seated on exam table. Left knee was prepped with alcohol swab and utilizing anterolateral approach, patient's left knee space was injected with 4:1  marcaine 0.5%: Kenalog 40mg /dL. Patient tolerated the procedure well without immediate complications.   Impression and Recommendations:     This case required medical decision making of moderate complexity.

## 2015-07-19 NOTE — Assessment & Plan Note (Signed)
Patient is doing relatively well. Encourage her to continue the same home exercises and work on the postural control. Patient is doing very well with the natural supplementations. Patient has made improvement. We will continue to monitor and continue with osteopathic manipulation. Patient will follow-up again in 4-6 weeks for further evaluation and treatment.

## 2015-08-16 ENCOUNTER — Encounter: Payer: Self-pay | Admitting: Family Medicine

## 2015-08-16 ENCOUNTER — Ambulatory Visit (INDEPENDENT_AMBULATORY_CARE_PROVIDER_SITE_OTHER): Payer: Federal, State, Local not specified - PPO | Admitting: Family Medicine

## 2015-08-16 VITALS — BP 124/82 | HR 86 | Ht 62.0 in | Wt 174.0 lb

## 2015-08-16 DIAGNOSIS — M9908 Segmental and somatic dysfunction of rib cage: Secondary | ICD-10-CM | POA: Diagnosis not present

## 2015-08-16 DIAGNOSIS — M9901 Segmental and somatic dysfunction of cervical region: Secondary | ICD-10-CM | POA: Diagnosis not present

## 2015-08-16 DIAGNOSIS — M999 Biomechanical lesion, unspecified: Secondary | ICD-10-CM

## 2015-08-16 DIAGNOSIS — M9902 Segmental and somatic dysfunction of thoracic region: Secondary | ICD-10-CM | POA: Diagnosis not present

## 2015-08-16 DIAGNOSIS — S83242D Other tear of medial meniscus, current injury, left knee, subsequent encounter: Secondary | ICD-10-CM | POA: Diagnosis not present

## 2015-08-16 DIAGNOSIS — R51 Headache: Secondary | ICD-10-CM

## 2015-08-16 DIAGNOSIS — G4486 Cervicogenic headache: Secondary | ICD-10-CM

## 2015-08-16 MED ORDER — IBUPROFEN-FAMOTIDINE 800-26.6 MG PO TABS: ORAL_TABLET | ORAL | Status: DC

## 2015-08-16 NOTE — Progress Notes (Signed)
Pre visit review using our clinic review tool, if applicable. No additional management support is needed unless otherwise documented below in the visit note. 

## 2015-08-16 NOTE — Assessment & Plan Note (Signed)
Responded fairly well to the exercises. Patient is doing well with formal physical therapy. Patient continues for the next 2 weeks. We discussed icing regimen and home exercises again and greater length. Patient does not want to do any type of bracing. Patient continue to monitor for any locking or giving out symptoms that would make [place] have to consider surgical intervention. Otherwise patient come back again in 6 weeks. At that time if having pain we can do a repeat steroid injection if necessary.

## 2015-08-16 NOTE — Progress Notes (Signed)
[name] [address] [place], [place] [address] Phone: [phone] Subjective:    I'm seeing this patient by the request  of:  Dawn Likens, MD   CC: migraines and cervicogenic headache  [id number] Dawn [name] is a 59 y.o. female coming in with complaint of migraines with a cervicogenic headache. Patient was seen by neurology and was referred here for the potential for osteopathic manipulation. Patient states that she has had headaches for multiple years patient has been seen me now and has been doing relatively well. Patient is been doing the upper back exercises and states that the neck and headaches have decreased significantly. Doing the over-the-counter supplementations. Patient feels that this is the best she has been in quite some time. Still some mild stiffness in the neck at the end of a long day. Patient knows that a lot of this is secondary to some of the posture that she has not made changes.  Patient is also having left knee pain. Patient has been seen by another provider and did have advanced imaging. This advanced imaging was reviewed by me. 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 Patient elected try injection. Patient states that she was at her percent better for 2 weeks and the pain is slowly started increasing again. Doing formal physical therapy. Still doing better than what she was prior to the injection but continues to have some mild discomfort when increasing her activity. No locking, giving out on her, still able to do all daily activities.    patient past medical history significant for cervical spondylosis. This was seen on MRI in October 2015 which was reviewed by me.patient does have an MRI of the brain pending  Past medical history, social, surgical and family history all reviewed in electronic  medical record.   Review of Systems: No headache, visual changes, nausea, vomiting, diarrhea, constipation, dizziness, abdominal pain, skin rash, fevers, chills, night sweats, weight loss, swollen lymph nodes, body aches, joint swelling, muscle aches, chest pain, shortness of breath, mood changes.   Objective Blood pressure 124/82, pulse 86, height 5\' 2"  (1.575 m), weight 174 lb (78.926 kg), SpO2 96 %.  General: No apparent distress alert and oriented x3 mood and affect normal, dressed appropriately.  HEENT: Pupils equal, extraocular movements intact  Respiratory: Patient's speak in full sentences and does not appear short of breath  Cardiovascular: No lower extremity edema, non tender, no erythema  Skin: Warm dry intact with no signs of infection or rash on extremities or on axial skeleton.  Abdomen: Soft nontender  Neuro: Cranial nerves II through XII are intact, neurovascularly intact in all extremities with 2+ DTRs and 2+ pulses.  Lymph: No lymphadenopathy of posterior or anterior cervical chain or axillae bilaterally.  Gait normal with good balance and coordination.  MSK:  Non tender with full range of motion and good stability and symmetric strength and tone of shoulders, elbows, wrist, hip, and ankles bilaterally.  Knee: Left Normal to inspection with no erythema or effusion or obvious bony abnormalities. Tender over the medial joint line. ROM full in flexion and extension and lower leg rotation. Ligaments with solid consistent endpoints including ACL, PCL, LCL, MCL. Positive Mcmurray's, Apley's, and Thessalonian tests. Still present Non painful patellar compression. Patellar glide without crepitus. Patellar and quadriceps tendons unremarkable. Hamstring and quadriceps strength is normal.  Contralateral  knee unremarkable   Neck: Mild increase in kyphosis of the upperback No palpable stepoffs. Negative Spurling's maneuver. Lacks last 5 of rotation and side bending on the right  side Grip strength and sensation normal in bilateral hands Strength good C4 to T1 distribution No sensory change to C4 to T1 Negative Hoffman sign bilaterally Reflexes normal Same as previous exam  Osteopathic findings C2 flexed rotated and side bent right C4 flexed rotated and side bent left T1 extended rotated and side bent right with elevated first rib T3 extended rotated and side bent right with inhaled third rib L2 flexed rotated and side bent left Sacrum right on right     Impression and Recommendations:     This case required medical decision making of moderate complexity.

## 2015-08-16 NOTE — Patient Instructions (Signed)
Good to see you Ice is your friend Duexis up to 3 times daily for pain Stay active and continue to work on the posture Finish up with PT for the knee 'in next 2 weeks See me again in 6 weeks and we can inject knee if needed again Happy holidays!

## 2015-08-16 NOTE — Assessment & Plan Note (Signed)
Improving overall. Patient encouraged to continue the exercises on a regular basis and we did do a refresher course of her posture changes. We discussed icing regimen and home exercises. Patient's will continue and we will see her again in 4-6 weeks for further evaluation and treatment

## 2015-08-16 NOTE — Assessment & Plan Note (Signed)
Decision today to treat with OMT was based on Physical Exam  After verbal consent patient was treated with HVLA, ME, FPR techniques in cervical, thoracic, and rib areas  Patient tolerated the procedure well with improvement in symptoms  Patient given exercises, stretches and lifestyle modifications  See medications in patient instructions if given  Patient will follow up in 4-6 weeks    

## 2015-09-06 ENCOUNTER — Ambulatory Visit (INDEPENDENT_AMBULATORY_CARE_PROVIDER_SITE_OTHER): Payer: Federal, State, Local not specified - PPO | Admitting: Neurology

## 2015-09-06 ENCOUNTER — Encounter: Payer: Self-pay | Admitting: Neurology

## 2015-09-06 VITALS — BP 130/92 | HR 79 | Resp 16 | Ht 62.0 in | Wt 180.0 lb

## 2015-09-06 DIAGNOSIS — M47812 Spondylosis without myelopathy or radiculopathy, cervical region: Secondary | ICD-10-CM

## 2015-09-06 DIAGNOSIS — G4486 Cervicogenic headache: Secondary | ICD-10-CM

## 2015-09-06 DIAGNOSIS — R51 Headache: Secondary | ICD-10-CM

## 2015-09-06 NOTE — Progress Notes (Signed)
Chart forwarded.  
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
## 2015-09-13 ENCOUNTER — Encounter: Payer: Self-pay | Admitting: Gastroenterology

## 2015-09-27 ENCOUNTER — Ambulatory Visit (INDEPENDENT_AMBULATORY_CARE_PROVIDER_SITE_OTHER): Payer: Federal, State, Local not specified - PPO | Admitting: Family Medicine

## 2015-09-27 ENCOUNTER — Encounter: Payer: Self-pay | Admitting: Family Medicine

## 2015-09-27 VITALS — BP 126/84 | HR 75 | Ht 62.0 in | Wt 181.0 lb

## 2015-09-27 DIAGNOSIS — R51 Headache: Secondary | ICD-10-CM | POA: Diagnosis not present

## 2015-09-27 DIAGNOSIS — M9901 Segmental and somatic dysfunction of cervical region: Secondary | ICD-10-CM

## 2015-09-27 DIAGNOSIS — S83242D Other tear of medial meniscus, current injury, left knee, subsequent encounter: Secondary | ICD-10-CM

## 2015-09-27 DIAGNOSIS — M9902 Segmental and somatic dysfunction of thoracic region: Secondary | ICD-10-CM | POA: Diagnosis not present

## 2015-09-27 DIAGNOSIS — G4486 Cervicogenic headache: Secondary | ICD-10-CM

## 2015-09-27 DIAGNOSIS — M999 Biomechanical lesion, unspecified: Secondary | ICD-10-CM

## 2015-09-27 DIAGNOSIS — M9908 Segmental and somatic dysfunction of rib cage: Secondary | ICD-10-CM | POA: Diagnosis not present

## 2015-09-27 NOTE — Progress Notes (Signed)
[name] [address] [place], [place] [address] Phone: [phone] Subjective:     CC: migraines and cervicogenic headache, follow up left knee pain  [id number] Dawn [name] is a 59 y.o. female coming in with complaint of migraines with a cervicogenic headache. Patient was seen by neurology and was referred here for the potential for osteopathic manipulation. Patient states that she has had headaches for multiple years patient has been seen me now and has been doing relatively well. Patient states with the vitamins as well as the manipulation she has been doing better. Some mild increase in stress with taking care of her mother. Otherwise feeling relatively well. Minimal headaches overall.  Patient is also having left knee pain. Patient has been seen by another provider and did have advanced imaging. This advanced imaging was reviewed by me. 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 Patient's last injection was multiple months ago. Patient states that the pain is giving her trouble. Was doing a lot more housework. States that he noticed swelling the next day. Slowly gotten better with the swelling but continues to have pain and catching her affecting daily activities.      Past Medical History  Diagnosis Date  . Neck pain   . Hypertension   . High cholesterol   . Fibroids   . Transient cerebral ischemia     10-22-12 ED, tia s/s but negative work up in ED. see note   . Allergic rhinitis   . GERD (gastroesophageal reflux disease)   . Carpal tunnel syndrome     right  . Anxiety   . Hemorrhoids   . Migraine   . Endometriosis   . Cervical dysplasia     1996  . Abnormal glucose   . Hyperlipemia   . Arthritis    Past Surgical History  Procedure Laterality Date  . Abdominal hysterectomy  Partial  . Cold  knife cone      for dysplasia  . Tubal ligation    . Carpal tunnel release Right   . Trigger finger release      thumb, middle finger   . Colonoscopy      last 11-2008 w/patterson, hems only    Social History  Substance Use Topics  . Smoking status: Never Smoker   . Smokeless tobacco: Never Used  . Alcohol Use: 0.6 oz/week    1 Glasses of wine per week     Comment: occasionally   Allergies  Allergen Reactions  . Sulfa Antibiotics Rash   Family History  Problem Relation Age of Onset  . Colon cancer Father   . Stomach cancer Father   . Diabetes Mother   . Hypertension Mother   . CVA Mother   . Kidney disease Mother   . Colon cancer Maternal Uncle   . Breast cancer Maternal Aunt   . Kidney disease Maternal Aunt   . Heart disease Brother   . Throat cancer Maternal Uncle   . Rectal cancer Neg Hx   . Aneurysm Mother      Past medical history, social, surgical and family history all reviewed in electronic medical record.   Review of Systems: No headache, visual changes, nausea, vomiting, diarrhea, constipation, dizziness, abdominal pain, skin rash, fevers, chills, night sweats, weight loss, swollen lymph nodes, body aches, joint swelling, muscle aches, chest pain,  shortness of breath, mood changes.   Objective Blood pressure 126/84, pulse 75, height 5\' 2"  (1.575 m), weight 181 lb (82.101 kg), SpO2 98 %.  General: No apparent distress alert and oriented x3 mood and affect normal, dressed appropriately.  HEENT: Pupils equal, extraocular movements intact  Respiratory: Patient's speak in full sentences and does not appear short of breath  Cardiovascular: No lower extremity edema, non tender, no erythema  Skin: Warm dry intact with no signs of infection or rash on extremities or on axial skeleton.  Abdomen: Soft nontender  Neuro: Cranial nerves II through XII are intact, neurovascularly intact in all extremities with 2+ DTRs and 2+ pulses.  Lymph: No lymphadenopathy of  posterior or anterior cervical chain or axillae bilaterally.  Gait normal with good balance and coordination.  MSK:  Non tender with full range of motion and good stability and symmetric strength and tone of shoulders, elbows, wrist, hip, and ankles bilaterally.  Knee: Left Mild effusion compared to the contralateral side Tender over the medial joint line. ROM full in flexion and extension and lower leg rotation. Ligaments with solid consistent endpoints including ACL, PCL, LCL, MCL. Positive Mcmurray's, Apley's, and Thessalonian tests. Still present Non painful patellar compression. Patellar glide without crepitus. Patellar and quadriceps tendons unremarkable. Hamstring and quadriceps strength is normal.  Contralateral knee unremarkable   Neck: Mild increase in kyphosis of the upperback No palpable stepoffs. Negative Spurling's maneuver. Lacks last 5 of rotation and side bending on the right side Grip strength and sensation normal in bilateral hands Strength good C4 to T1 distribution No sensory change to C4 to T1 Negative Hoffman sign bilaterally Reflexes normal Same as previous exam  Osteopathic findings C2 flexed rotated and side bent right C4 flexed rotated and side bent left T3 extended rotated and side bent right with inhaled third rib L2 flexed rotated and side bent left Sacrum right on right  After informed written and verbal consent, patient was seated on exam table. Left knee was prepped with alcohol swab and utilizing anterolateral approach, patient's left knee space was injected with 4:1  marcaine 0.5%: Kenalog 40mg /dL. Patient tolerated the procedure well without immediate complications.    Impression and Recommendations:     This case required medical decision making of moderate complexity.

## 2015-09-27 NOTE — Progress Notes (Signed)
Pre visit review using our clinic review tool, if applicable. No additional management support is needed unless otherwise documented below in the visit note. 

## 2015-09-27 NOTE — Assessment & Plan Note (Signed)
Decision today to treat with OMT was based on Physical Exam  After verbal consent patient was treated with HVLA, ME, FPR techniques in cervical, thoracic, and rib areas  Patient tolerated the procedure well with improvement in symptoms  Patient given exercises, stretches and lifestyle modifications  See medications in patient instructions if given  Patient will follow up in 4-6 weeks    

## 2015-09-27 NOTE — Patient Instructions (Signed)
Good to see you We injected your knee to give you some relief Try to do the posture exercises a little more regularly On wall with heels, butt shoulder and head touching for a goal of 5 minutes daily  Stay active Ice when you need it.  See me again in 3-4 weeks [place]!

## 2015-09-27 NOTE — Assessment & Plan Note (Signed)
Overall had been doing relatively well. Continues respond fairly well to osteopathic epilation. I do believe the patient does have some increasing stress recently. Discussed with her about posture as well as some self manipulation techniques. Patient will come back and see me again in 4 weeks for further evaluation and treatment.

## 2015-09-27 NOTE — Assessment & Plan Note (Signed)
Patient even an injection today. Tolerated the procedure fairly well. Still had some mild pain after the injection. Discussed with patient that the arthritis that she has of the chondromalacia could also be contribute in. We discussed bracing which patient declined. Patient does not want to redo the formal physical therapy. Prescription medications as prescribed today. Patient's could be a candidate for viscous supplementation and we'll discuss in 3-4 weeks that further follow-up.

## 2015-10-25 ENCOUNTER — Encounter: Payer: Self-pay | Admitting: Family Medicine

## 2015-10-25 ENCOUNTER — Ambulatory Visit (INDEPENDENT_AMBULATORY_CARE_PROVIDER_SITE_OTHER): Payer: Federal, State, Local not specified - PPO | Admitting: Family Medicine

## 2015-10-25 VITALS — BP 126/80 | HR 87 | Wt 181.0 lb

## 2015-10-25 DIAGNOSIS — M9908 Segmental and somatic dysfunction of rib cage: Secondary | ICD-10-CM | POA: Diagnosis not present

## 2015-10-25 DIAGNOSIS — M9901 Segmental and somatic dysfunction of cervical region: Secondary | ICD-10-CM | POA: Diagnosis not present

## 2015-10-25 DIAGNOSIS — M999 Biomechanical lesion, unspecified: Secondary | ICD-10-CM

## 2015-10-25 DIAGNOSIS — M9902 Segmental and somatic dysfunction of thoracic region: Secondary | ICD-10-CM

## 2015-10-25 DIAGNOSIS — R51 Headache: Secondary | ICD-10-CM

## 2015-10-25 DIAGNOSIS — G4486 Cervicogenic headache: Secondary | ICD-10-CM

## 2015-10-25 NOTE — Assessment & Plan Note (Signed)
Patient is responding very well. Only taking conservative over-the-counter natural supplementations are now for headaches. Has not used any muscle relaxers. Continues to respond well to the osteopathic manipulation. We discussed icing regimen. Patient and will come back and see me again in 6-8 weeks for further evaluation and treatment.

## 2015-10-25 NOTE — Assessment & Plan Note (Signed)
Decision today to treat with OMT was based on Physical Exam  After verbal consent patient was treated with HVLA, ME, FPR techniques in cervical, thoracic, and rib areas  Patient tolerated the procedure well with improvement in symptoms  Patient given exercises, stretches and lifestyle modifications  See medications in patient instructions if given  Patient will follow up in 6-8 weeks

## 2015-10-25 NOTE — Patient Instructions (Signed)
Good to see you  Ice is your friend Tennis ball at the base of the skull would be great  New exercises for hip adductors sometimes See me again in 6-8 weeks.

## 2015-10-25 NOTE — Progress Notes (Signed)
[name] [place] [place], [place] [address] Phone: [phone] Subjective:     CC: migraines and cervicogenic headache, follow up left knee pain  [id number] Dawn [name] is a 60 y.o. female coming in with complaint of migraines with a cervicogenic headache. Patient was seen by neurology and was referred here for the potential for osteopathic manipulation. Patient has been responding very well to the osteopathic manipulation. Some mild headaches but nothing as bad as they were. States that the frequency as well as the severity is significantly improved. Denies any significant new symptoms. Still not perfect but is making progress she states.  Patient is also having left knee pain. Found to have symptoms and corresponding with a meniscal tear. Patient was given an injection. States that she is 100%. No problems.      Past Medical History  Diagnosis Date  . Neck pain   . Hypertension   . High cholesterol   . Fibroids   . Transient cerebral ischemia     10-22-12 ED, tia s/s but negative work up in ED. see note   . Allergic rhinitis   . GERD (gastroesophageal reflux disease)   . Carpal tunnel syndrome     right  . Anxiety   . Hemorrhoids   . Migraine   . Endometriosis   . Cervical dysplasia     1996  . Abnormal glucose   . Hyperlipemia   . Arthritis    Past Surgical History  Procedure Laterality Date  . Abdominal hysterectomy  Partial  . Cold knife cone      for dysplasia  . Tubal ligation    . Carpal tunnel release Right   . Trigger finger release      thumb, middle finger   . Colonoscopy      last 11-2008 w/patterson, hems only    Social History  Substance Use Topics  . Smoking status: Never Smoker   . Smokeless tobacco: Never Used  . Alcohol Use: 0.6 oz/week    1 Glasses of wine per week     Comment: occasionally   Allergies  Allergen Reactions  . Sulfa Antibiotics Rash   Family History  Problem Relation Age of  Onset  . Colon cancer Father   . Stomach cancer Father   . Diabetes Mother   . Hypertension Mother   . CVA Mother   . Kidney disease Mother   . Colon cancer Maternal Uncle   . Breast cancer Maternal Aunt   . Kidney disease Maternal Aunt   . Heart disease Brother   . Throat cancer Maternal Uncle   . Rectal cancer Neg Hx   . Aneurysm Mother      Past medical history, social, surgical and family history all reviewed in electronic medical record.   Review of Systems: No headache, visual changes, nausea, vomiting, diarrhea, constipation, dizziness, abdominal pain, skin rash, fevers, chills, night sweats, weight loss, swollen lymph nodes, body aches, joint swelling, muscle aches, chest pain, shortness of breath, mood changes.   Objective Blood pressure 126/80, pulse 87, weight 181 lb (82.101 kg), SpO2 94 %.  General: No apparent distress alert and oriented x3 mood and affect normal, dressed appropriately.  HEENT: Pupils equal, extraocular movements intact  Respiratory: Patient's speak in full sentences and does not appear short of breath  Cardiovascular: No lower extremity edema, non tender, no erythema  Skin: Warm dry intact with no signs of infection or rash on extremities or on  axial skeleton.  Abdomen: Soft nontender  Neuro: Cranial nerves II through XII are intact, neurovascularly intact in all extremities with 2+ DTRs and 2+ pulses.  Lymph: No lymphadenopathy of posterior or anterior cervical chain or axillae bilaterally.  Gait normal with good balance and coordination.  MSK:  Non tender with full range of motion and good stability and symmetric strength and tone of shoulders, elbows, wrist, hip, and ankles bilaterally.  Knee: Left No effusion Nontender ROM full in flexion and extension and lower leg rotation. Ligaments with solid consistent endpoints including ACL, PCL, LCL, MCL. Negative McMurray's Non painful patellar compression. Patellar glide without crepitus. Patellar  and quadriceps tendons unremarkable. Hamstring and quadriceps strength is normal.  Contralateral knee unremarkable   Neck: Mild increase in kyphosis of the upperback No palpable stepoffs. Negative Spurling's maneuver. Lacks last 5 of rotation and side bending on the right side Grip strength and sensation normal in bilateral hands Strength good C4 to T1 distribution No sensory change to C4 to T1 Negative Hoffman sign bilaterally Reflexes normal Same as previous exam  Osteopathic findings C2 flexed rotated and side bent right C4 flexed rotated and side bent left T3 extended rotated and side bent right with inhaled third rib L2 flexed rotated and side bent left Sacrum right on right      Impression and Recommendations:     This case required medical decision making of moderate complexity.

## 2015-12-13 ENCOUNTER — Encounter: Payer: Self-pay | Admitting: Family Medicine

## 2015-12-13 ENCOUNTER — Ambulatory Visit (INDEPENDENT_AMBULATORY_CARE_PROVIDER_SITE_OTHER): Payer: Federal, State, Local not specified - PPO | Admitting: Family Medicine

## 2015-12-13 VITALS — BP 130/86 | HR 86 | Ht 62.0 in | Wt 183.0 lb

## 2015-12-13 DIAGNOSIS — M9908 Segmental and somatic dysfunction of rib cage: Secondary | ICD-10-CM

## 2015-12-13 DIAGNOSIS — R51 Headache: Secondary | ICD-10-CM

## 2015-12-13 DIAGNOSIS — M9901 Segmental and somatic dysfunction of cervical region: Secondary | ICD-10-CM

## 2015-12-13 DIAGNOSIS — M9902 Segmental and somatic dysfunction of thoracic region: Secondary | ICD-10-CM

## 2015-12-13 DIAGNOSIS — S83242D Other tear of medial meniscus, current injury, left knee, subsequent encounter: Secondary | ICD-10-CM

## 2015-12-13 DIAGNOSIS — M999 Biomechanical lesion, unspecified: Secondary | ICD-10-CM

## 2015-12-13 DIAGNOSIS — G4486 Cervicogenic headache: Secondary | ICD-10-CM

## 2015-12-13 NOTE — Assessment & Plan Note (Signed)
Decision today to treat with OMT was based on Physical Exam  After verbal consent patient was treated with HVLA, ME, FPR techniques in cervical, thoracic, and rib areas  Patient tolerated the procedure well with improvement in symptoms  Patient given exercises, stretches and lifestyle modifications  See medications in patient instructions if given  Patient will follow up in 6-8 weeks

## 2015-12-13 NOTE — Assessment & Plan Note (Signed)
Seems to be doing well. We'll continue to monitor.

## 2015-12-13 NOTE — Assessment & Plan Note (Signed)
Mild exacerbation from previous exams. We discussed with patient at great length. Patient has elected to start doing the home exercises on a regular basis. Patient wants to avoid formal physical therapy at this time. Patient will start working on her ergonomics and posterolateral exercises regularly. Patient and will come back and see me again in 6 weeks for further evaluation and treatment. Continues to respond well to osteopathic manipulation.

## 2015-12-13 NOTE — Progress Notes (Signed)
Pre visit review using our clinic review tool, if applicable. No additional management support is needed unless otherwise documented below in the visit note. 

## 2015-12-13 NOTE — Progress Notes (Signed)
[name] [address] [place], [place] [address] Phone: [phone] Subjective:     CC: migraines and cervicogenic headache, follow up left knee pain  [id number] Dawn [name] is a 60 y.o. female coming in with complaint of migraines with a cervicogenic headache. Patient was seen by neurology and was referred here for the potential for osteopathic manipulation.  Was doing significantly better. Patient unfortunately had to go visit her ailing mother. Patient stepped in a recliner for 2 weeks. Seems to have cost some mild increase in headaches. Patient states when she does do the exercises as well as the self manipulation techniques she seems to do very well. Patient has not been doing this as regularly and has not been working on the posture. No new symptoms is worsening of previous symptoms.  Patient is also having left knee pain. Then used do very well with her left knee pain.     Past Medical History  Diagnosis Date  . Neck pain   . Hypertension   . High cholesterol   . Fibroids   . Transient cerebral ischemia     10-22-12 ED, tia s/s but negative work up in ED. see note   . Allergic rhinitis   . GERD (gastroesophageal reflux disease)   . Carpal tunnel syndrome     right  . Anxiety   . Hemorrhoids   . Migraine   . Endometriosis   . Cervical dysplasia     1996  . Abnormal glucose   . Hyperlipemia   . Arthritis    Past Surgical History  Procedure Laterality Date  . Abdominal hysterectomy  Partial  . Cold knife cone      for dysplasia  . Tubal ligation    . Carpal tunnel release Right   . Trigger finger release      thumb, middle finger   . Colonoscopy      last 11-2008 w/patterson, hems only    Social History  Substance Use Topics  . Smoking status: Never Smoker   . Smokeless tobacco: Never Used  . Alcohol Use: 0.6 oz/week    1 Glasses of wine per week     Comment: occasionally   Allergies  Allergen Reactions  . Sulfa  Antibiotics Rash   Family History  Problem Relation Age of Onset  . Colon cancer Father   . Stomach cancer Father   . Diabetes Mother   . Hypertension Mother   . CVA Mother   . Kidney disease Mother   . Colon cancer Maternal Uncle   . Breast cancer Maternal Aunt   . Kidney disease Maternal Aunt   . Heart disease Brother   . Throat cancer Maternal Uncle   . Rectal cancer Neg Hx   . Aneurysm Mother      Past medical history, social, surgical and family history all reviewed in electronic medical record.   Review of Systems: No headache, visual changes, nausea, vomiting, diarrhea, constipation, dizziness, abdominal pain, skin rash, fevers, chills, night sweats, weight loss, swollen lymph nodes, body aches, joint swelling, muscle aches, chest pain, shortness of breath, mood changes.   Objective Blood pressure 130/86, pulse 86, height 5\' 2"  (1.575 m), weight 183 lb (83.008 kg), SpO2 96 %.  General: No apparent distress alert and oriented x3 mood and affect normal, dressed appropriately.  HEENT: Pupils equal, extraocular movements intact  Respiratory: Patient's speak in full sentences and does not appear short of breath  Cardiovascular: No lower  extremity edema, non tender, no erythema  Skin: Warm dry intact with no signs of infection or rash on extremities or on axial skeleton.  Abdomen: Soft nontender  Neuro: Cranial nerves II through XII are intact, neurovascularly intact in all extremities with 2+ DTRs and 2+ pulses.  Lymph: No lymphadenopathy of posterior or anterior cervical chain or axillae bilaterally.  Gait normal with good balance and coordination.  MSK:  Non tender with full range of motion and good stability and symmetric strength and tone of shoulders, elbows, wrist, hip, and ankles bilaterally.  Knee: Left No effusion Nontender ROM full in flexion and extension and lower leg rotation. Ligaments with solid consistent endpoints including ACL, PCL, LCL, MCL. Negative  McMurray's Non painful patellar compression. Patellar glide without crepitus. Patellar and quadriceps tendons unremarkable. Hamstring and quadriceps strength is normal.  Contralateral knee unremarkable   Neck: Mild increase in kyphosis of the upperbackWorse than previous exam No palpable stepoffs. Negative Spurling's maneuver. Lacks last 5 of rotation and side bending on the right side Grip strength and sensation normal in bilateral hands Strength good C4 to T1 distribution No sensory change to C4 to T1 Negative Hoffman sign bilaterally Reflexes normal Patient is more tender than previous exam in the paraspinal musculature of the scapula.  Osteopathic findings C2 flexed rotated and side bent right C4 flexed rotated and side bent left T3 extended rotated and side bent right with inhaled third rib T6 extended rotated and side bent left L2 flexed rotated and side bent left Sacrum right on right      Impression and Recommendations:     This case required medical decision making of moderate complexity.

## 2015-12-13 NOTE — Patient Instructions (Signed)
Good to see you  I am glad your mom is doing better Try to get back in the routine and start to do the posture exercises a little more frequently COntinue the vitamins Keep the shoulder blades back  See me again in 6 weeks.

## 2016-01-15 DIAGNOSIS — M25569 Pain in unspecified knee: Secondary | ICD-10-CM | POA: Diagnosis not present

## 2016-01-15 DIAGNOSIS — L739 Follicular disorder, unspecified: Secondary | ICD-10-CM | POA: Diagnosis not present

## 2016-01-22 ENCOUNTER — Ambulatory Visit (INDEPENDENT_AMBULATORY_CARE_PROVIDER_SITE_OTHER): Payer: Federal, State, Local not specified - PPO | Admitting: Family Medicine

## 2016-01-22 ENCOUNTER — Encounter: Payer: Self-pay | Admitting: Family Medicine

## 2016-01-22 VITALS — BP 124/82 | HR 78 | Wt 185.0 lb

## 2016-01-22 DIAGNOSIS — S83241A Other tear of medial meniscus, current injury, right knee, initial encounter: Secondary | ICD-10-CM

## 2016-01-22 MED ORDER — MELOXICAM 15 MG PO TABS: 15.0000 mg | ORAL_TABLET | Freq: Every day | ORAL | Status: DC

## 2016-01-22 NOTE — Progress Notes (Signed)
[name] [place], [place] [address] Phone: [phone] Subjective:      CC: right knee pain  [id number] Dawn [name] is a 60 y.o. female coming in with complaint of Right knee pain. Patient describes the pain as a dull aching sensation after she states with walking [place] the beach. Does not remember any specific time when she injured it though. States that it started over the course last week and seems to be worsening. Patient states it difficult to go up and down stairs. Having pain on the medial aspect of the knee. States that twisting motions or even at night can be a severe pain. Very similar to her contralateral side where she had trouble greater than 6 months ago. States that she still is doing well with the contralateral side. Denies any swelling at this time. Some radiation down the leg. Denies any back pain that seems to be associated with it. Rates the severity of pain a 6 out of 10     Past Medical History  Diagnosis Date  . Neck pain   . Hypertension   . High cholesterol   . Fibroids   . Transient cerebral ischemia     10-22-12 ED, tia s/s but negative work up in ED. see note   . Allergic rhinitis   . GERD (gastroesophageal reflux disease)   . Carpal tunnel syndrome     right  . Anxiety   . Hemorrhoids   . Migraine   . Endometriosis   . Cervical dysplasia     1996  . Abnormal glucose   . Hyperlipemia   . Arthritis    Past Surgical History  Procedure Laterality Date  . Abdominal hysterectomy  Partial  . Cold knife cone      for dysplasia  . Tubal ligation    . Carpal tunnel release Right   . Trigger finger release      thumb, middle finger   . Colonoscopy      last 11-2008 w/patterson, hems only    Social History   Social History  . Marital Status: Married    Spouse Name: N/A  . Number of Children: 1  . Years of Education: N/A   Occupational History  . Retired    Social History Main Topics  .  Smoking status: Never Smoker   . Smokeless tobacco: Never Used  . Alcohol Use: 0.6 oz/week    1 Glasses of wine per week     Comment: occasionally  . Drug Use: No  . Sexual Activity: No   Other Topics Concern  . None   Social History Narrative   Allergies  Allergen Reactions  . Sulfa Antibiotics Rash   Family History  Problem Relation Age of Onset  . Colon cancer Father   . Stomach cancer Father   . Diabetes Mother   . Hypertension Mother   . CVA Mother   . Kidney disease Mother   . Colon cancer Maternal Uncle   . Breast cancer Maternal Aunt   . Kidney disease Maternal Aunt   . Heart disease Brother   . Throat cancer Maternal Uncle   . Rectal cancer Neg Hx   . Aneurysm Mother     Past medical history, social, surgical and family history all reviewed in electronic medical record.  No pertanent information unless stated regarding to the chief complaint.   Review of Systems: No headache, visual changes, nausea, vomiting, diarrhea, constipation, dizziness, abdominal  pain, skin rash, fevers, chills, night sweats, weight loss, swollen lymph nodes, body aches, joint swelling, muscle aches, chest pain, shortness of breath, mood changes.   Objective Blood pressure 124/82, pulse 78, weight 185 lb (83.915 kg).  General: No apparent distress alert and oriented x3 mood and affect normal, dressed appropriately.  HEENT: Pupils equal, extraocular movements intact  Respiratory: Patient's speak in full sentences and does not appear short of breath  Cardiovascular: No lower extremity edema, non tender, no erythema  Skin: Warm dry intact with no signs of infection or rash on extremities or on axial skeleton.  Abdomen: Soft nontender  Neuro: Cranial nerves II through XII are intact, neurovascularly intact in all extremities with 2+ DTRs and 2+ pulses.  Lymph: No lymphadenopathy of posterior or anterior cervical chain or axillae bilaterally.  Gait antalgic gait  MSK:  Non tender with full  range of motion and good stability and symmetric strength and tone of shoulders, elbows, wrist, hip, knee and ankles bilaterally. Mild arthritic changes Knee:right Normal to inspection with no erythema or effusion or obvious bony abnormalities. Tender to palpation over the anterior joint line mostly medial side ROM full in flexion and extension and lower leg rotation. Ligaments with solid consistent endpoints including ACL, PCL, LCL, MCL. Negative Mcmurray's, Apley's, and Thessalonian tests. Non painful patellar compression. Patellar glide with moderate crepitus. Patellar and quadriceps tendons unremarkable. Hamstring and quadriceps strength is normal.  Contralateral knee unremarkable MSK US performed of: right knee This study was ordered, performed, and interpreted by Charlann Boxer D.O.  Knee: All structures visualized. Anterior medial meniscus does have what appears to be a large tear with increasing Doppler flow and hypoechoic changes. Mild displacement noted. Mild narrowing of the joint space Patellar Tendon unremarkable on long and transverse views without effusion. No abnormality of prepatellar bursa. LCL and MCL unremarkable on long and transverse views. No abnormality of origin of medial or lateral head of the gastrocnemius. Picture saved in internal hard drive  IMPRESSION:  Acute tear of the medial meniscus.  After informed written and verbal consent, patient was seated on exam table. Right knee was prepped with alcohol swab and utilizing anterolateral approach, patient's right knee space was injected with 4:1  marcaine 0.5%: Kenalog 40mg /dL. Patient tolerated the procedure well without immediate complications.    Impression and Recommendations:     This case required medical decision making of moderate complexity.      Note: This dictation was prepared with Dragon dictation along with smaller phrase technology. Any transcriptional errors that result from this process are  unintentional.

## 2016-01-22 NOTE — Assessment & Plan Note (Signed)
Patient does have an acute tear noted on the medial meniscus. Patient was given an injection today and tolerated the procedure well. Patient has a brace. We'll start home exercises patient will also follow-up with me in 4-6 weeks. Worsening symptoms she'll call sooner. Do not feel that any x-rays aren't warranted at this time.

## 2016-01-22 NOTE — Patient Instructions (Signed)
Good ot see you  Ice 20 minutes 2 times daily. Usually after activity and before bed. Exercises 3 times a week.  You have the handout Wear brace with a lot of walking or any lifting or twisting, but not at night Avoid flexing the knee and twisting.  Meloxicam daily for 10 days then as needed pennsaid pinkie amount topically 2 times daily as needed.  See me again in 4 weeks to make sure you are doing well.

## 2016-01-24 ENCOUNTER — Ambulatory Visit: Payer: Federal, State, Local not specified - PPO | Admitting: Family Medicine

## 2016-02-20 ENCOUNTER — Ambulatory Visit (INDEPENDENT_AMBULATORY_CARE_PROVIDER_SITE_OTHER)
Admission: RE | Admit: 2016-02-20 | Discharge: 2016-02-20 | Disposition: A | Discharge status: Home / Self Care | Payer: Federal, State, Local not specified - PPO | Source: Ambulatory Visit | Attending: Family Medicine | Admitting: Family Medicine

## 2016-02-20 ENCOUNTER — Ambulatory Visit (INDEPENDENT_AMBULATORY_CARE_PROVIDER_SITE_OTHER): Payer: Federal, State, Local not specified - PPO | Admitting: Family Medicine

## 2016-02-20 ENCOUNTER — Encounter: Payer: Self-pay | Admitting: Family Medicine

## 2016-02-20 VITALS — BP 126/82 | HR 72 | Wt 179.0 lb

## 2016-02-20 DIAGNOSIS — S83241D Other tear of medial meniscus, current injury, right knee, subsequent encounter: Secondary | ICD-10-CM | POA: Diagnosis not present

## 2016-02-20 DIAGNOSIS — K080 Exfoliation of teeth due to systemic causes: Secondary | ICD-10-CM | POA: Diagnosis not present

## 2016-02-20 DIAGNOSIS — M25461 Effusion, right knee: Secondary | ICD-10-CM | POA: Diagnosis not present

## 2016-02-20 MED ORDER — TRAMADOL HCL 50 MG PO TABS: 50.0000 mg | ORAL_TABLET | Freq: Every evening | ORAL | Status: DC | PRN

## 2016-02-20 NOTE — Assessment & Plan Note (Signed)
Patient is failed conservative therapy at this moment. We discussed with patient about the possibility of formal physical therapy but because patient is having instability as well as locking in the knee at think this is more concerning. I feel that patient does need advance imaging and an MRI will be ordered. X-rays ordered today as well to rule out any other bony abnormality that can be contribute in. Patient does have a brace will continue to wear this. Discussed icing regimen. Does have tramadol for breakthrough pain that was prescribed today. Patient will have further evaluation after the MRI and we'll discuss proper management.  Spent  25 minutes with patient face-to-face and had greater than 50% of counseling including as described above in assessment and plan.

## 2016-02-20 NOTE — Progress Notes (Signed)
[name] [address], [place] [address] Phone: [phone] Subjective:    CC: right knee pain  [id number] Dawn [name] is a 60 y.o. female coming in with complaint of Right knee pain. Patient was found to have a acute meniscal tear of the right knee. Patient was given an injection was to start formal physical therapy. States that she is only approximately 2-5% better. Still having times from the knee feels like it is going to give out on her and has locked on her twice. She has not fallen but has come very close. Patient is concerned. Patient had a very similar presentation on her contralateral side and does not want to wait a year before surgical intervention. Patient is concerned because this is affecting daily activities and does have intermittent swelling.     Past Medical History  Diagnosis Date  . Neck pain   . Hypertension   . High cholesterol   . Fibroids   . Transient cerebral ischemia     10-22-12 ED, tia s/s but negative work up in ED. see note   . Allergic rhinitis   . GERD (gastroesophageal reflux disease)   . Carpal tunnel syndrome     right  . Anxiety   . Hemorrhoids   . Migraine   . Endometriosis   . Cervical dysplasia     1996  . Abnormal glucose   . Hyperlipemia   . Arthritis    Past Surgical History  Procedure Laterality Date  . Abdominal hysterectomy  Partial  . Cold knife cone      for dysplasia  . Tubal ligation    . Carpal tunnel release Right   . Trigger finger release      thumb, middle finger   . Colonoscopy      last 11-2008 w/patterson, hems only    Social History   Social History  . Marital Status: Married    Spouse Name: N/A  . Number of Children: 1  . Years of Education: N/A   Occupational History  . Retired    Social History Main Topics  . Smoking status: Never Smoker   . Smokeless tobacco: Never Used  . Alcohol Use: 0.6 oz/week    1 Glasses of wine per week     Comment:  occasionally  . Drug Use: No  . Sexual Activity: No   Other Topics Concern  . None   Social History Narrative   Allergies  Allergen Reactions  . Sulfa Antibiotics Rash   Family History  Problem Relation Age of Onset  . Colon cancer Father   . Stomach cancer Father   . Diabetes Mother   . Hypertension Mother   . CVA Mother   . Kidney disease Mother   . Colon cancer Maternal Uncle   . Breast cancer Maternal Aunt   . Kidney disease Maternal Aunt   . Heart disease Brother   . Throat cancer Maternal Uncle   . Rectal cancer Neg Hx   . Aneurysm Mother     Past medical history, social, surgical and family history all reviewed in electronic medical record.  No pertanent information unless stated regarding to the chief complaint.   Review of Systems: No headache, visual changes, nausea, vomiting, diarrhea, constipation, dizziness, abdominal pain, skin rash, fevers, chills, night sweats, weight loss, swollen lymph nodes, body aches, joint swelling, muscle aches, chest pain, shortness of breath, mood changes.   Objective Blood pressure 126/82, pulse 72,  weight 179 lb (81.194 kg).  General: No apparent distress alert and oriented x3 mood and affect normal, dressed appropriately.  HEENT: Pupils equal, extraocular movements intact  Respiratory: Patient's speak in full sentences and does not appear short of breath  Cardiovascular: No lower extremity edema, non tender, no erythema  Skin: Warm dry intact with no signs of infection or rash on extremities or on axial skeleton.  Abdomen: Soft nontender  Neuro: Cranial nerves II through XII are intact, neurovascularly intact in all extremities with 2+ DTRs and 2+ pulses.  Lymph: No lymphadenopathy of posterior or anterior cervical chain or axillae bilaterally.  Gait antalgic gait  MSK:  Non tender with full range of motion and good stability and symmetric strength and tone of shoulders, elbows, wrist, hip, knee and ankles bilaterally. Mild  arthritic changes Knee:right Normal to inspection with no erythema or effusion or obvious bony abnormalities. Continued tenderness mostly over the medial joint line ROM full in flexion and extension and lower leg rotation. Ligaments with solid consistent endpoints including ACL, PCL, LCL, MCL. Positive McMurray's. Non painful patellar compression. Patellar glide with moderate crepitus. Patellar and quadriceps tendons unremarkable. Hamstring and quadriceps strength is normal.  Contralateral knee unremarkable      Impression and Recommendations:     This case required medical decision making of moderate complexity.      Note: This dictation was prepared with Dragon dictation along with smaller phrase technology. Any transcriptional errors that result from this process are unintentional.

## 2016-02-20 NOTE — Patient Instructions (Addendum)
Good to see you Ice is still your friend Xray downstairs today  MRi is ordered and they will be calling you.  Tramadol at night to help with pain.   After I get MRI and will send you a message about it and what to do next and if Dr. Percell Miller is needed.

## 2016-02-27 ENCOUNTER — Ambulatory Visit
Admission: RE | Admit: 2016-02-27 | Discharge: 2016-02-27 | Disposition: A | Discharge status: Home / Self Care | Payer: Federal, State, Local not specified - PPO | Source: Ambulatory Visit | Attending: Family Medicine | Admitting: Family Medicine

## 2016-02-27 DIAGNOSIS — S83241D Other tear of medial meniscus, current injury, right knee, subsequent encounter: Secondary | ICD-10-CM

## 2016-02-27 DIAGNOSIS — M25561 Pain in right knee: Secondary | ICD-10-CM | POA: Diagnosis not present

## 2016-03-04 ENCOUNTER — Other Ambulatory Visit: Payer: Self-pay | Admitting: *Deleted

## 2016-03-04 DIAGNOSIS — S83242D Other tear of medial meniscus, current injury, left knee, subsequent encounter: Secondary | ICD-10-CM

## 2016-03-08 DIAGNOSIS — M25362 Other instability, left knee: Secondary | ICD-10-CM | POA: Diagnosis not present

## 2016-03-15 NOTE — H&P (Signed)
Dawn [name] is a pleasant 60 year-old female, new patient, who presents to our clinic today with right knee pain.  She states this began about eight weeks ago following a trip to the beach.  No specific injury just lots of walking on the sand.  She describes the pain in the medial aspect of her knee.  Sharp shooting pains with pivoting motions.  No previous history of right knee pathology.  Of note, she did have an MRI on Feb 20, 2016 which showed a medial meniscus tear.   Past medical history: Significant for glasses, hypertension, headaches, high blood pressure and high cholesterol.  Negative for diabetes or arthritis.   Allergies: Sulfa.  Current medications: Crestor and a blood pressure medicine.  Family history: Significant for diabetes, hypertension, heart disease and arthritis.   Social history: Nonsmoker.  She does drink an occasional alcoholic beverage.  She is married and retired.    EXAMINATION: Well-developed, well-nourished female in no acute distress.  Alert and oriented x 3.  Lungs clear to auscultation bilaterally.  Heart sounds normal.  Blood pressure: 132/89.  Pulse: 97.  Height: 5?2.  Weight: 174 pounds.  Exam of her right knee reveals a trace effusion.  Range of motion 0-120 degrees.  Marked tenderness medial meniscus with positive medial McMurray.  She is neurovascularly intact distally.    X-RAYS: 50% joint space narrowing medial compartment.    PLAN: At this point there is nothing short of right knee arthroscopic debridement medial meniscus that will relieve Dawn [name]'s pain.  Risks, benefits and possible complications of surgery have been reviewed.  Rehab and recovery time discussed.  All questions answered.  Paperwork complete.    Dawn [name], M.D.

## 2016-03-25 ENCOUNTER — Encounter (HOSPITAL_BASED_OUTPATIENT_CLINIC_OR_DEPARTMENT_OTHER): Payer: Self-pay | Admitting: *Deleted

## 2016-03-26 ENCOUNTER — Other Ambulatory Visit: Payer: Self-pay

## 2016-03-26 ENCOUNTER — Encounter (HOSPITAL_BASED_OUTPATIENT_CLINIC_OR_DEPARTMENT_OTHER)
Admission: RE | Admit: 2016-03-26 | Discharge: 2016-03-26 | Disposition: A | Discharge status: Home / Self Care | Payer: Federal, State, Local not specified - PPO | Source: Ambulatory Visit | Attending: Orthopedic Surgery | Admitting: Orthopedic Surgery

## 2016-03-26 DIAGNOSIS — S83241A Other tear of medial meniscus, current injury, right knee, initial encounter: Secondary | ICD-10-CM | POA: Diagnosis not present

## 2016-03-26 DIAGNOSIS — X58XXXA Exposure to other specified factors, initial encounter: Secondary | ICD-10-CM | POA: Diagnosis not present

## 2016-03-26 DIAGNOSIS — I10 Essential (primary) hypertension: Secondary | ICD-10-CM | POA: Diagnosis not present

## 2016-03-26 LAB — BASIC METABOLIC PANEL
ANION GAP: 6 (ref 5–15)
BUN: 11 mg/dL (ref 6–20)
CALCIUM: 9.7 mg/dL (ref 8.9–10.3)
CO2: 25 mmol/L (ref 22–32)
CREATININE: 0.75 mg/dL (ref 0.44–1.00)
Chloride: 108 mmol/L (ref 101–111)
GFR calc non Af Amer: >60 mL/min (ref >60)
Glucose, Bld: 99 mg/dL (ref 65–99)
Potassium: 4.6 mmol/L (ref 3.5–5.1)
SODIUM: 139 mmol/L (ref 135–145)

## 2016-03-26 NOTE — Progress Notes (Signed)
Dr. Al Corpus reviewed Ekg - ok for surgery.

## 2016-03-28 ENCOUNTER — Ambulatory Visit (HOSPITAL_BASED_OUTPATIENT_CLINIC_OR_DEPARTMENT_OTHER): Payer: Federal, State, Local not specified - PPO | Admitting: Certified Registered"

## 2016-03-28 ENCOUNTER — Encounter (HOSPITAL_BASED_OUTPATIENT_CLINIC_OR_DEPARTMENT_OTHER)
Admission: RE | Disposition: A | Discharge status: Home / Self Care | Payer: Self-pay | Source: Ambulatory Visit | Attending: Orthopedic Surgery

## 2016-03-28 ENCOUNTER — Encounter (HOSPITAL_BASED_OUTPATIENT_CLINIC_OR_DEPARTMENT_OTHER): Payer: Self-pay | Admitting: *Deleted

## 2016-03-28 ENCOUNTER — Ambulatory Visit (HOSPITAL_BASED_OUTPATIENT_CLINIC_OR_DEPARTMENT_OTHER)
Admission: RE | Admit: 2016-03-28 | Discharge: 2016-03-28 | Disposition: A | Discharge status: Home / Self Care | Payer: Federal, State, Local not specified - PPO | Source: Ambulatory Visit | Attending: Orthopedic Surgery | Admitting: Orthopedic Surgery

## 2016-03-28 DIAGNOSIS — S83241A Other tear of medial meniscus, current injury, right knee, initial encounter: Secondary | ICD-10-CM | POA: Insufficient documentation

## 2016-03-28 DIAGNOSIS — M2241 Chondromalacia patellae, right knee: Secondary | ICD-10-CM | POA: Diagnosis not present

## 2016-03-28 DIAGNOSIS — X58XXXA Exposure to other specified factors, initial encounter: Secondary | ICD-10-CM | POA: Insufficient documentation

## 2016-03-28 DIAGNOSIS — I10 Essential (primary) hypertension: Secondary | ICD-10-CM | POA: Diagnosis not present

## 2016-03-28 HISTORY — PX: KNEE ARTHROSCOPY WITH MEDIAL MENISECTOMY: SHX5651

## 2016-03-28 HISTORY — DX: Cerebral infarction, unspecified: I63.9

## 2016-03-28 SURGERY — ARTHROSCOPY, KNEE, WITH MEDIAL MENISCECTOMY
Anesthesia: General | Site: Knee | Laterality: Right

## 2016-03-28 MED ORDER — LACTATED RINGERS IV SOLN: INTRAVENOUS | Status: DC

## 2016-03-28 MED ORDER — HYDROMORPHONE HCL 1 MG/ML IJ SOLN
INTRAMUSCULAR | Status: AC
  Filled 2016-03-28: qty 1

## 2016-03-28 MED ORDER — FENTANYL CITRATE (PF) 100 MCG/2ML IJ SOLN
INTRAMUSCULAR | Status: AC
  Filled 2016-03-28: qty 2

## 2016-03-28 MED ORDER — CEFAZOLIN SODIUM-DEXTROSE 2-4 GM/100ML-% IV SOLN
2.0000 g | INTRAVENOUS | Status: AC
  Administered 2016-03-28: 2 g via INTRAVENOUS

## 2016-03-28 MED ORDER — ONDANSETRON HCL 4 MG PO TABS: 4.0000 mg | ORAL_TABLET | Freq: Three times a day (TID) | ORAL | Status: DC | PRN

## 2016-03-28 MED ORDER — DEXAMETHASONE SODIUM PHOSPHATE 10 MG/ML IJ SOLN
INTRAMUSCULAR | Status: AC
  Filled 2016-03-28: qty 1

## 2016-03-28 MED ORDER — ONDANSETRON HCL 4 MG/2ML IJ SOLN
INTRAMUSCULAR | Status: DC | PRN
Start: 2016-03-28 — End: 2016-03-28
  Administered 2016-03-28: 4 mg via INTRAVENOUS

## 2016-03-28 MED ORDER — OXYCODONE HCL 5 MG/5ML PO SOLN: 5.0000 mg | Freq: Once | ORAL | Status: DC | PRN

## 2016-03-28 MED ORDER — BUPIVACAINE-EPINEPHRINE (PF) 0.5% -1:200000 IJ SOLN
INTRAMUSCULAR | Status: AC
  Filled 2016-03-28: qty 30

## 2016-03-28 MED ORDER — CHLORHEXIDINE GLUCONATE 4 % EX LIQD: 60.0000 mL | Freq: Once | CUTANEOUS | Status: DC

## 2016-03-28 MED ORDER — ATROPINE SULFATE 0.4 MG/ML IJ SOLN
INTRAMUSCULAR | Status: AC
  Filled 2016-03-28: qty 1

## 2016-03-28 MED ORDER — METHYLPREDNISOLONE ACETATE 80 MG/ML IJ SUSP
INTRAMUSCULAR | Status: AC
  Filled 2016-03-28: qty 1

## 2016-03-28 MED ORDER — DEXAMETHASONE SODIUM PHOSPHATE 4 MG/ML IJ SOLN
INTRAMUSCULAR | Status: DC | PRN
  Administered 2016-03-28: 10 mg via INTRAVENOUS

## 2016-03-28 MED ORDER — ONDANSETRON HCL 4 MG/2ML IJ SOLN
INTRAMUSCULAR | Status: AC
  Filled 2016-03-28: qty 2

## 2016-03-28 MED ORDER — MEPERIDINE HCL 25 MG/ML IJ SOLN: 6.2500 mg | INTRAMUSCULAR | Status: DC | PRN

## 2016-03-28 MED ORDER — SODIUM CHLORIDE 0.9 % IR SOLN
Status: DC | PRN
  Administered 2016-03-28: 3000 mL

## 2016-03-28 MED ORDER — LIDOCAINE 2% (20 MG/ML) 5 ML SYRINGE
INTRAMUSCULAR | Status: AC
  Filled 2016-03-28: qty 5

## 2016-03-28 MED ORDER — LACTATED RINGERS IV SOLN
INTRAVENOUS | Status: DC
  Administered 2016-03-28: 07:00:00 via INTRAVENOUS

## 2016-03-28 MED ORDER — FENTANYL CITRATE (PF) 100 MCG/2ML IJ SOLN
50.0000 ug | INTRAMUSCULAR | Status: AC | PRN
  Administered 2016-03-28: 25 ug via INTRAVENOUS
  Administered 2016-03-28: 100 ug via INTRAVENOUS
  Administered 2016-03-28: 50 ug via INTRAVENOUS
  Administered 2016-03-28: 25 ug via INTRAVENOUS

## 2016-03-28 MED ORDER — METHYLPREDNISOLONE ACETATE 80 MG/ML IJ SUSP
INTRAMUSCULAR | Status: DC | PRN
  Administered 2016-03-28: 80 mg via INTRA_ARTICULAR

## 2016-03-28 MED ORDER — GLYCOPYRROLATE 0.2 MG/ML IJ SOLN: 0.2000 mg | Freq: Once | INTRAMUSCULAR | Status: DC | PRN

## 2016-03-28 MED ORDER — BUPIVACAINE HCL (PF) 0.5 % IJ SOLN
INTRAMUSCULAR | Status: DC | PRN
  Administered 2016-03-28: 15 mL

## 2016-03-28 MED ORDER — OXYCODONE HCL 5 MG PO TABS: 5.0000 mg | ORAL_TABLET | Freq: Once | ORAL | Status: DC | PRN

## 2016-03-28 MED ORDER — OXYCODONE-ACETAMINOPHEN 5-325 MG PO TABS: 1.0000 | ORAL_TABLET | ORAL | Status: DC | PRN

## 2016-03-28 MED ORDER — MIDAZOLAM HCL 2 MG/2ML IJ SOLN: 1.0000 mg | INTRAMUSCULAR | Status: DC | PRN

## 2016-03-28 MED ORDER — BUPIVACAINE HCL (PF) 0.25 % IJ SOLN
INTRAMUSCULAR | Status: AC
  Filled 2016-03-28: qty 90

## 2016-03-28 MED ORDER — BUPIVACAINE-EPINEPHRINE (PF) 0.5% -1:200000 IJ SOLN
INTRAMUSCULAR | Status: DC | PRN
  Administered 2016-03-28: 5 mL via PERINEURAL

## 2016-03-28 MED ORDER — HYDROMORPHONE HCL 1 MG/ML IJ SOLN
0.2500 mg | INTRAMUSCULAR | Status: DC | PRN
  Administered 2016-03-28: 0.5 mg via INTRAVENOUS

## 2016-03-28 MED ORDER — LIDOCAINE 2% (20 MG/ML) 5 ML SYRINGE
INTRAMUSCULAR | Status: DC | PRN
  Administered 2016-03-28: 100 mg via INTRAVENOUS

## 2016-03-28 MED ORDER — SCOPOLAMINE 1 MG/3DAYS TD PT72: 1.0000 | MEDICATED_PATCH | Freq: Once | TRANSDERMAL | Status: DC | PRN

## 2016-03-28 MED ORDER — BUPIVACAINE HCL (PF) 0.5 % IJ SOLN
INTRAMUSCULAR | Status: AC
  Filled 2016-03-28: qty 30

## 2016-03-28 MED ORDER — PROPOFOL 10 MG/ML IV BOLUS
INTRAVENOUS | Status: DC | PRN
  Administered 2016-03-28: 200 mg via INTRAVENOUS

## 2016-03-28 SURGICAL SUPPLY — 41 items
BANDAGE ACE 6X5 VEL STRL LF (GAUZE/BANDAGES/DRESSINGS) ×2 IMPLANT
BLADE CUDA 5.5 (BLADE) IMPLANT
BLADE CUDA GRT WHITE 3.5 (BLADE) IMPLANT
BLADE CUTTER GATOR 3.5 (BLADE) ×2 IMPLANT
BLADE CUTTER MENIS 5.5 (BLADE) IMPLANT
BLADE GREAT WHITE 4.2 (BLADE) ×2 IMPLANT
BUR OVAL 4.0 (BURR) IMPLANT
CUTTER MENISCUS  4.2MM (BLADE)
CUTTER MENISCUS 4.2MM (BLADE) IMPLANT
DRAPE ARTHROSCOPY W/POUCH 90 (DRAPES) ×2 IMPLANT
DURAPREP 26ML APPLICATOR (WOUND CARE) ×2 IMPLANT
ELECT MENISCUS 165MM 90D (ELECTRODE) ×2 IMPLANT
ELECT REM PT RETURN 9FT ADLT (ELECTROSURGICAL)
ELECTRODE REM PT RTRN 9FT ADLT (ELECTROSURGICAL) IMPLANT
GAUZE SPONGE 4X4 12PLY STRL (GAUZE/BANDAGES/DRESSINGS) ×2 IMPLANT
GAUZE XEROFORM 1X8 LF (GAUZE/BANDAGES/DRESSINGS) ×2 IMPLANT
GLOVE BIO SURGEON STRL SZ 6.5 (GLOVE) ×2 IMPLANT
GLOVE BIOGEL PI IND STRL 7.0 (GLOVE) ×2 IMPLANT
GLOVE BIOGEL PI IND STRL 8.5 (GLOVE) ×1 IMPLANT
GLOVE BIOGEL PI INDICATOR 7.0 (GLOVE) ×2
GLOVE BIOGEL PI INDICATOR 8.5 (GLOVE) ×1
GLOVE ECLIPSE 7.0 STRL STRAW (GLOVE) ×2 IMPLANT
GLOVE SURG ORTHO 8.0 STRL STRW (GLOVE) ×2 IMPLANT
GLOVE SURG SS PI 8.0 STRL IVOR (GLOVE) ×2 IMPLANT
GOWN STRL REUS W/ TWL LRG LVL3 (GOWN DISPOSABLE) ×1 IMPLANT
GOWN STRL REUS W/ TWL XL LVL3 (GOWN DISPOSABLE) ×3 IMPLANT
GOWN STRL REUS W/TWL LRG LVL3 (GOWN DISPOSABLE) ×1
GOWN STRL REUS W/TWL XL LVL3 (GOWN DISPOSABLE) ×3
HOLDER KNEE FOAM BLUE (MISCELLANEOUS) ×2 IMPLANT
IV NS IRRIG 3000ML ARTHROMATIC (IV SOLUTION) ×4 IMPLANT
KNEE WRAP E Z 3 GEL PACK (MISCELLANEOUS) ×2 IMPLANT
MANIFOLD NEPTUNE II (INSTRUMENTS) ×2 IMPLANT
PACK ARTHROSCOPY DSU (CUSTOM PROCEDURE TRAY) ×2 IMPLANT
PACK BASIN DAY SURGERY FS (CUSTOM PROCEDURE TRAY) ×2 IMPLANT
PENCIL BUTTON HOLSTER BLD 10FT (ELECTRODE) ×2 IMPLANT
SET ARTHROSCOPY TUBING (MISCELLANEOUS) ×1
SET ARTHROSCOPY TUBING LN (MISCELLANEOUS) ×1 IMPLANT
SUT ETHILON 3 0 PS 1 (SUTURE) ×2 IMPLANT
SUT VIC AB 3-0 FS2 27 (SUTURE) IMPLANT
TOWEL OR 17X24 6PK STRL BLUE (TOWEL DISPOSABLE) ×2 IMPLANT
WATER STERILE IRR 1000ML POUR (IV SOLUTION) ×2 IMPLANT

## 2016-03-28 NOTE — Transfer of Care (Signed)
Immediate Anesthesia Transfer of Care Note  Patient: Dawn [name]  Procedure(s) Performed: Procedure(s): RIGHT KNEE ARTHROSCOPY CHONDROPLASTY WITH MEDIAL MENISCECTOMY (Right)  Patient Location: PACU  Anesthesia Type:General  Level of Consciousness: awake, alert , oriented and patient cooperative  Airway & Oxygen Therapy: Patient Spontanous Breathing and Patient connected to face mask oxygen  Post-op Assessment: Report given to RN, Post -op Vital signs reviewed and stable and Patient moving all extremities  Post vital signs: Reviewed and stable  Last Vitals:  Filed Vitals:   03/28/16 0643 03/28/16 0817  BP: 134/68   Pulse: 96 103  Temp: 36.8 C   Resp: 20 16    Last Pain:  Filed Vitals:   03/28/16 0818  PainSc: 3       Patients Stated Pain Goal: 2 ([id number])  Complications: No apparent anesthesia complications

## 2016-03-28 NOTE — Discharge Instructions (Signed)

## 2016-03-28 NOTE — Op Note (Signed)
NAMEICEIS, EBNER NO.:  [id number]  MEDICAL RECORD NO.:  [id number]  LOCATION:                                 FACILITY:  PHYSICIAN:  [name], M.D. DATE OF BIRTH:  21-Jun-1956  DATE OF PROCEDURE:  03/28/2016 DATE OF DISCHARGE:                              OPERATIVE REPORT   PREOPERATIVE DIAGNOSIS:  Right knee medial meniscus tear with tricompartmental degenerative arthritis.  POSTOPERATIVE DIAGNOSIS:  Right knee medial meniscus tear with tricompartmental degenerative arthritis with grade 3 changes, medial compartment, isolated lateral tibial plateau and trial patellofemoral joint.  PROCEDURES:  Right knee exam under anesthesia and arthroscopy.  Partial medial meniscectomy.  Tricompartmental chondroplasty.  SURGEON:  [name], M.D.  ASSISTANT:  Elmyra Ricks, PA  ANESTHESIA:  General.  BLOOD LOSS:  Minimal.  SPECIMENS:  None.  CULTURES:  None.  COMPLICATIONS:  None.  DRESSINGS:  Soft compressive.  TOURNIQUET:  Not employed.  DESCRIPTION OF PROCEDURE:  The patient was brought to the operating room and after adequate anesthesia had been obtained, leg holder applied. Leg was prepped and draped in usual sterile fashion.  Two portals; one each medial and lateral parapatellar.  Arthroscope was introduced.  Knee distended and inspected.  Good patellar tracking, but grade 2 and 3 changes throughout, treated with chondroplasty.  Nothing grade 4.  ACL intact.  Lateral compartment and meniscus intact.  Femur looked good, but grade 2 and 3 isolated changes on the plateau debrided.  Medially diffuse grade 3 changes over the entire medial femoral condyle debrided. Marked complex tearing, posterior half of medial meniscus.  Taken out to a stable rim, tapered into remaining meniscus.  All loose bodies were removed.  Entire knee examined, no other findings were appreciated. Instruments and fluids were removed.  Portals were closed with  nylon. Knee injected with Depo-Medrol and Marcaine.  Sterile compressive dressing applied.  Anesthesia reversed.  Brought to the recovery room. Tolerated the surgery well.  No complications.     [name], M.D.   ______________________________ [name], M.D.    DFM/MEDQ  D:  03/28/2016  T:  03/28/2016  Job:  [id number]

## 2016-03-28 NOTE — Anesthesia Postprocedure Evaluation (Signed)
Anesthesia Post Note  Patient: Dawn [name]  Procedure(s) Performed: Procedure(s) (LRB): RIGHT KNEE ARTHROSCOPY CHONDROPLASTY WITH MEDIAL MENISCECTOMY (Right)  Patient location during evaluation: PACU Anesthesia Type: General Level of consciousness: awake and alert Pain management: pain level controlled Vital Signs Assessment: post-procedure vital signs reviewed and stable Respiratory status: spontaneous breathing, nonlabored ventilation and respiratory function stable Cardiovascular status: blood pressure returned to baseline and stable Postop Assessment: no signs of nausea or vomiting Anesthetic complications: no    Last Vitals:  Filed Vitals:   03/28/16 0900 03/28/16 0924  BP: 132/92 139/92  Pulse: 78 70  Temp:  36.6 C  Resp: 11 18    Last Pain:  Filed Vitals:   03/28/16 0925  PainSc: 2                  Rishard Delange A

## 2016-03-28 NOTE — Anesthesia Procedure Notes (Signed)
Procedure Name: LMA Insertion Date/Time: 03/28/2016 7:29 AM Performed by: Baxter Flattery Pre-anesthesia Checklist: Patient identified, Emergency Drugs available, Suction available and Patient being monitored Patient Re-evaluated:Patient Re-evaluated prior to inductionOxygen Delivery Method: Circle system utilized Preoxygenation: Pre-oxygenation with 100% oxygen Intubation Type: IV induction Ventilation: Mask ventilation without difficulty LMA: LMA inserted LMA Size: 4.0 Number of attempts: 1 Airway Equipment and Method: Bite block Placement Confirmation: positive ETCO2 and breath sounds checked- equal and bilateral Tube secured with: Tape Dental Injury: Teeth and Oropharynx as per pre-operative assessment

## 2016-03-28 NOTE — Interval H&P Note (Signed)
History and Physical Interval Note:  03/28/2016 7:18 AM  Dawn [name]  has presented today for surgery, with the diagnosis of CHONDROMALACIA PATELLAE RIGHT KNEE OTHER TEAR OF MEDIAL MENISCUS CURRENT INJURY UNSPECIFIED KNEE INITIAL ENCOUNTER   The various methods of treatment have been discussed with the patient and family. After consideration of risks, benefits and other options for treatment, the patient has consented to  Procedure(s): RIGHT KNEE ARTHROSCOPY CHONDROPLASTY WITH MEDIAL MENISCECTOMY (Right) as a surgical intervention .  The patient's history has been reviewed, patient examined, no change in status, stable for surgery.  I have reviewed the patient's chart and labs.  Questions were answered to the patient's satisfaction.     Dawn [name]

## 2016-03-28 NOTE — Anesthesia Preprocedure Evaluation (Signed)
Anesthesia Evaluation  Patient identified by MRN, date of birth, ID band Patient awake    Reviewed: Allergy & Precautions, NPO status , Patient's Chart, lab work & pertinent test results  Airway Mallampati: I  TM Distance: >3 FB Neck ROM: Full    Dental  (+) Teeth Intact, Dental Advisory Given   Pulmonary    breath sounds clear to auscultation       Cardiovascular hypertension, Pt. on medications  Rhythm:Regular Rate:Normal     Neuro/Psych    GI/Hepatic   Endo/Other    Renal/GU      Musculoskeletal   Abdominal   Peds  Hematology   Anesthesia Other Findings   Reproductive/Obstetrics                             Anesthesia Physical Anesthesia Plan  ASA: II  Anesthesia Plan: General   Post-op Pain Management:    Induction: Intravenous  Airway Management Planned: LMA  Additional Equipment:   Intra-op Plan:   Post-operative Plan: Extubation in OR  Informed Consent: I have reviewed the patients History and Physical, chart, labs and discussed the procedure including the risks, benefits and alternatives for the proposed anesthesia with the patient or authorized representative who has indicated his/her understanding and acceptance.   Dental advisory given  Plan Discussed with: CRNA, Anesthesiologist and Surgeon  Anesthesia Plan Comments:         Anesthesia Quick Evaluation  

## 2016-03-29 ENCOUNTER — Encounter (HOSPITAL_BASED_OUTPATIENT_CLINIC_OR_DEPARTMENT_OTHER): Payer: Self-pay | Admitting: Orthopedic Surgery

## 2016-04-05 DIAGNOSIS — S83241D Other tear of medial meniscus, current injury, right knee, subsequent encounter: Secondary | ICD-10-CM | POA: Diagnosis not present

## 2016-04-16 DIAGNOSIS — K219 Gastro-esophageal reflux disease without esophagitis: Secondary | ICD-10-CM | POA: Diagnosis not present

## 2016-04-16 DIAGNOSIS — R7303 Prediabetes: Secondary | ICD-10-CM | POA: Diagnosis not present

## 2016-04-16 DIAGNOSIS — Z0000 Encounter for general adult medical examination without abnormal findings: Secondary | ICD-10-CM | POA: Diagnosis not present

## 2016-04-16 DIAGNOSIS — J301 Allergic rhinitis due to pollen: Secondary | ICD-10-CM | POA: Diagnosis not present

## 2016-04-16 DIAGNOSIS — I10 Essential (primary) hypertension: Secondary | ICD-10-CM | POA: Diagnosis not present

## 2016-04-16 DIAGNOSIS — E7800 Pure hypercholesterolemia, unspecified: Secondary | ICD-10-CM | POA: Diagnosis not present

## 2016-05-09 DIAGNOSIS — S83241D Other tear of medial meniscus, current injury, right knee, subsequent encounter: Secondary | ICD-10-CM | POA: Diagnosis not present

## 2016-06-04 DIAGNOSIS — M25561 Pain in right knee: Secondary | ICD-10-CM | POA: Diagnosis not present

## 2016-06-10 DIAGNOSIS — K080 Exfoliation of teeth due to systemic causes: Secondary | ICD-10-CM | POA: Diagnosis not present

## 2016-06-10 DIAGNOSIS — M25561 Pain in right knee: Secondary | ICD-10-CM | POA: Diagnosis not present

## 2016-06-21 DIAGNOSIS — M722 Plantar fascial fibromatosis: Secondary | ICD-10-CM | POA: Diagnosis not present

## 2016-07-22 DIAGNOSIS — H5213 Myopia, bilateral: Secondary | ICD-10-CM | POA: Diagnosis not present

## 2016-07-22 DIAGNOSIS — H25813 Combined forms of age-related cataract, bilateral: Secondary | ICD-10-CM | POA: Diagnosis not present

## 2016-07-23 DIAGNOSIS — M722 Plantar fascial fibromatosis: Secondary | ICD-10-CM | POA: Diagnosis not present

## 2016-08-05 ENCOUNTER — Encounter: Payer: Self-pay | Admitting: *Deleted

## 2016-08-05 ENCOUNTER — Encounter: Payer: Federal, State, Local not specified - PPO | Attending: Family Medicine | Admitting: *Deleted

## 2016-08-05 DIAGNOSIS — R7303 Prediabetes: Secondary | ICD-10-CM | POA: Insufficient documentation

## 2016-08-05 DIAGNOSIS — Z713 Dietary counseling and surveillance: Secondary | ICD-10-CM | POA: Insufficient documentation

## 2016-08-05 DIAGNOSIS — E669 Obesity, unspecified: Secondary | ICD-10-CM

## 2016-08-05 NOTE — Patient Instructions (Addendum)
Consider Dawn [name], Physiological scientist.  [phone] Do whatever exercise you can.  Strength training, weight lifting, etc Try not to go too long without eating Eat without the tv on and try to eat more slowly Focus on health, not on weight Follow MyPlate recommendations, focusing on balance.  Try more whole grains and lean protein (more chicken and fish and [place], choose more baked/broiled/grillled and less fried) Try to get in more water versus caffeine drinks Get enough sleep

## 2016-08-05 NOTE — Progress Notes (Signed)
  Medical Nutrition Therapy:  Appt start time: 1100 end time:  1200.  Assessment:  Primary concerns today: Dawn [name] is here for nutrition counseling pertaining to desire to lose weight. She reports wanting to start weight loss medication.  She has knee injuries and has been advised to lose weight.  Her knees were damaged when she was the same weight.  Her sibling also has knee problems.    She can't exercise they way she would like as a result  Shares grocery shopping responsibility with her husband.  They also share cooking responsibility.  They bake and fry foods.  She eats fast food most days. They cook on the weekends.   When at home she eats in the bedroom and he eats in the den.  She watched tv while eating.  She thinks she is a fast eater.  She is not a picky eater.  Heaviest weight is currently 180 lb Lowest weight 105 in her 20s Most consistent weight was 130 prior to menopause and it's increasing Would like to weigh 150 ish  Women in her family look like her.  Last A1C is 6.0%.  Also hyperlipidemia  Preferred Learning Style:   No preference indicated   Learning Readiness:  Ready   MEDICATIONS: see list   DIETARY INTAKE:  Usual eating pattern includes 3 meals and several snacks per day.  Avoided foods include none.    24-hr recall:  B ( AM): breakfast burrito with hashbrowns and tea  Snk ( AM): none  L ( PM): [place] necks with peppers, onions, potatoes Snk ( PM):  D ( PM): 2 bowls soup with crackers, apple, chocolate cream cakes Snk ( PM): none Beverages: water, tea, coffee  Usual physical activity: none  Nutritional Diagnosis:  NB-2.1 Physical inactivity As related to knee pain.  As evidenced by self-report.    Intervention:  Nutrition counseling provided.  Discussed focusing on health, not weight.  Discussed MyPlate recommendations for meal planning, focusing on whole grains and lean proteins.  Recommended mindful eating without the tv and eating more slowly.   Discussed working with PT to learn exercises for her knees.  Discussed lower fat cooking methods, increasing water over caffeine drinks, and getting adequate sleep.  Teaching Method Utilized:  Visual Auditory   Handouts given during visit include:  MyPlate   Barriers to learning/adherence to lifestyle change: knee  Demonstrated degree of understanding via:  Teach Back   Monitoring/Evaluation:  Dietary intake, exercise, labs, and body weight prn.

## 2016-08-27 ENCOUNTER — Ambulatory Visit (INDEPENDENT_AMBULATORY_CARE_PROVIDER_SITE_OTHER): Payer: Federal, State, Local not specified - PPO | Admitting: Family Medicine

## 2016-08-27 ENCOUNTER — Encounter: Payer: Self-pay | Admitting: Family Medicine

## 2016-08-27 VITALS — BP 124/72 | HR 92 | Ht 62.0 in | Wt 181.0 lb

## 2016-08-27 DIAGNOSIS — M170 Bilateral primary osteoarthritis of knee: Secondary | ICD-10-CM | POA: Diagnosis not present

## 2016-08-27 DIAGNOSIS — M1711 Unilateral primary osteoarthritis, right knee: Secondary | ICD-10-CM | POA: Insufficient documentation

## 2016-08-27 DIAGNOSIS — R51 Headache: Secondary | ICD-10-CM

## 2016-08-27 DIAGNOSIS — M999 Biomechanical lesion, unspecified: Secondary | ICD-10-CM | POA: Diagnosis not present

## 2016-08-27 DIAGNOSIS — G4486 Cervicogenic headache: Secondary | ICD-10-CM

## 2016-08-27 DIAGNOSIS — K080 Exfoliation of teeth due to systemic causes: Secondary | ICD-10-CM | POA: Diagnosis not present

## 2016-08-27 MED ORDER — GABAPENTIN 100 MG PO CAPS: 200.0000 mg | ORAL_CAPSULE | Freq: Every day | ORAL | 3 refills | Status: DC

## 2016-08-27 NOTE — Assessment & Plan Note (Signed)
Decision today to treat with OMT was based on Physical Exam  After verbal consent patient was treated with HVLA, ME, FPR techniques in cervical, thoracic, and rib areas  Patient tolerated the procedure well with improvement in symptoms  Patient given exercises, stretches and lifestyle modifications  See medications in patient instructions if given  Patient will follow up in 3-4 weeks

## 2016-08-27 NOTE — Assessment & Plan Note (Signed)
Patient was given bilateral injections today. We discussed icing regimen and home exercises. Patient continuing stay active. She could be a candidate for viscous supplementation if necessary. Patient will hold on any type of bracing. We'll continue conservative therapy otherwise.

## 2016-08-27 NOTE — Assessment & Plan Note (Signed)
Encouraged patient to start doing more posture, ergonomics, and home exercise. Patient will stay active. Patient will come back and see me again in 3-4 weeks for further evaluation and treatment.

## 2016-08-27 NOTE — Progress Notes (Signed)
[name] [place], [place] [address] Phone: [phone] Subjective:    CC: right knee pain f/u, neck pain  [id number]  Dawn [name] is a 60 y.o. female coming in with complaint of Right knee pain. Patient was found to have a acute meniscal tear of the right knee. Patient was given an injection was to start formal physical therapy. Patient states that now she is having pain in both knees. Patient did have a surgical intervention with a meniscectomy on the right knee previously. Patient states though that unfortunate notable for very swollen and discomfort. States that she has trouble moving them. Walking long distances causes more pain. Denies any back pain lower.  Worsening neck pain. Has had a history of cervicogenic headaches. Feels that this is getting worse. Not doing the exercises as much. Did respond well to osteopathic manipulation previously.       Past Medical History:  Diagnosis Date  . Abnormal glucose   . Allergic rhinitis   . Anxiety   . Arthritis   . Carpal tunnel syndrome    right  . Cervical dysplasia    1996  . Endometriosis   . Fibroids   . Hemorrhoids   . High cholesterol   . Hyperlipemia   . Hypertension   . Migraine   . Neck pain   . Stroke [facility]) 2014   TIA  . Transient cerebral ischemia    10-22-12 ED, tia s/s but negative work up in ED. see note    Past Surgical History:  Procedure Laterality Date  . ABDOMINAL HYSTERECTOMY  Partial  . CARPAL TUNNEL RELEASE Right   . cold knife cone     for dysplasia  . COLONOSCOPY     last 11-2008 w/patterson, hems only   . KNEE ARTHROSCOPY WITH MEDIAL MENISECTOMY Right 03/28/2016   Procedure: RIGHT KNEE ARTHROSCOPY CHONDROPLASTY WITH MEDIAL MENISCECTOMY;  Surgeon: [name], MD;  Location: [facility];  Service: Orthopedics;  Laterality: Right;  . TRIGGER FINGER RELEASE     thumb, middle finger   . TUBAL LIGATION     Social History    Social History  . Marital status: Married    Spouse name: N/A  . Number of children: 1  . Years of education: N/A   Occupational History  . Retired    Social History Main Topics  . Smoking status: Never Smoker  . Smokeless tobacco: Never Used  . Alcohol use 0.6 oz/week    1 Glasses of wine per week     Comment: occasionally  . Drug use: No  . Sexual activity: No   Other Topics Concern  . None   Social History Narrative  . None   Allergies  Allergen Reactions  . Sulfa Antibiotics Rash   Family History  Problem Relation Age of Onset  . Colon cancer Father   . Stomach cancer Father   . Diabetes Mother   . Hypertension Mother   . CVA Mother   . Kidney disease Mother   . Aneurysm Mother   . Colon cancer Maternal Uncle   . Breast cancer Maternal Aunt   . Kidney disease Maternal Aunt   . Heart disease Brother   . Throat cancer Maternal Uncle   . Rectal cancer Neg Hx     Past medical history, social, surgical and family history all reviewed in electronic medical record.  No pertanent information unless stated regarding to the chief complaint.  Review of Systems: No headache, visual changes, nausea, vomiting, diarrhea, constipation, dizziness, abdominal pain, skin rash, fevers, chills, night sweats, weight loss, swollen lymph nodes, body aches, joint swelling, muscle aches, chest pain, shortness of breath, mood changes.   Objective  Blood pressure 124/72, pulse 92, height 5\' 2"  (1.575 m), weight 181 lb (82.1 kg), SpO2 98 %.  Systems examined below as of 08/27/16 General: NAD A&O x3 mood, affect normal  HEENT: Pupils equal, extraocular movements intact no nystagmus Respiratory: not short of breath at rest or with speaking Cardiovascular: No lower extremity edema, non tender Skin: Warm dry intact with no signs of infection or rash on extremities or on axial skeleton. Abdomen: Soft nontender, no masses Neuro: Cranial nerves  intact, neurovascularly intact in all  extremities with 2+ DTRs and 2+ pulses. Lymph: No lymphadenopathy appreciated today  Gait normal with good balance and coordination.  MSK: Non tender with full range of motion and good stability and symmetric strength and tone of shoulders, elbows, wrist,  knee hips and ankles bilaterally.    Gait antalgic gait  MSK:  Non tender with full range of motion and good stability and symmetric strength and tone of shoulders, elbows, wrist, hip, knee and ankles bilaterally. Mild arthritic changes Knee:Bilateral Arthritic changes with varus deformity significant thigh to calf ratio Worsening discomfort over the medial joint line right greater than left ROM full in flexion and extension and lower leg rotation. Valgus deformity noted and some instability of the right knee with varus force Mild painful patellar compression. Patellar glide with moderate crepitus. Patellar and quadriceps tendons unremarkable. Hamstring and quadriceps strength is normal.  Contralateral knee unremarkable  Neck: Inspection unremarkable. No palpable stepoffs. Negative Spurling's maneuver. Full neck range of motion Grip strength and sensation normal in bilateral hands Strength good C4 to T1 distribution No sensory change to C4 to T1 Negative Hoffman sign bilaterally Reflexes normal   After informed written and verbal consent, patient was seated on exam table. Right knee was prepped with alcohol swab and utilizing anterolateral approach, patient's right knee space was injected with 4:1  marcaine 0.5%: Kenalog 40mg /dL. Patient tolerated the procedure well without immediate complications.  After informed written and verbal consent, patient was seated on exam table. Left knee was prepped with alcohol swab and utilizing anterolateral approach, patient's left knee space was injected with 4:1  marcaine 0.5%: Kenalog 40mg /dL. Patient tolerated the procedure well without immediate complications.  Osteopathic findings C2 flexed  rotated and side bent right T3 extended rotated and side bent right T8 extended rotated and side bent right L2 flexed rotated inside that right   Impression and Recommendations:     This case required medical decision making of moderate complexity.      Note: This dictation was prepared with Dragon dictation along with smaller phrase technology. Any transcriptional errors that result from this process are unintentional.

## 2016-08-27 NOTE — Patient Instructions (Signed)
Good to see yo u Ice is your friend For your neck use the tennis balls Gabapentin 200mg  at night Stay active See me again in 2-3 weeks if you want to start synvisc or call me if not wanting to do it.  Happy holidays!

## 2016-09-12 ENCOUNTER — Ambulatory Visit: Payer: Federal, State, Local not specified - PPO | Admitting: Family Medicine

## 2016-10-22 DIAGNOSIS — Z1231 Encounter for screening mammogram for malignant neoplasm of breast: Secondary | ICD-10-CM | POA: Diagnosis not present

## 2016-10-22 DIAGNOSIS — Z01419 Encounter for gynecological examination (general) (routine) without abnormal findings: Secondary | ICD-10-CM | POA: Diagnosis not present

## 2016-10-22 DIAGNOSIS — Z6833 Body mass index (BMI) 33.0-33.9, adult: Secondary | ICD-10-CM | POA: Diagnosis not present

## 2016-10-23 ENCOUNTER — Other Ambulatory Visit: Payer: Self-pay | Admitting: Obstetrics and Gynecology

## 2016-10-23 DIAGNOSIS — R928 Other abnormal and inconclusive findings on diagnostic imaging of breast: Secondary | ICD-10-CM

## 2016-10-25 ENCOUNTER — Ambulatory Visit
Admission: RE | Admit: 2016-10-25 | Discharge: 2016-10-25 | Disposition: A | Discharge status: Home / Self Care | Payer: Federal, State, Local not specified - PPO | Source: Ambulatory Visit | Attending: Obstetrics and Gynecology | Admitting: Obstetrics and Gynecology

## 2016-10-25 DIAGNOSIS — R928 Other abnormal and inconclusive findings on diagnostic imaging of breast: Secondary | ICD-10-CM

## 2016-10-25 DIAGNOSIS — N6012 Diffuse cystic mastopathy of left breast: Secondary | ICD-10-CM | POA: Diagnosis not present

## 2016-10-25 DIAGNOSIS — N6321 Unspecified lump in the left breast, upper outer quadrant: Secondary | ICD-10-CM | POA: Diagnosis not present

## 2016-10-25 DIAGNOSIS — R7303 Prediabetes: Secondary | ICD-10-CM | POA: Diagnosis not present

## 2016-10-25 DIAGNOSIS — E7800 Pure hypercholesterolemia, unspecified: Secondary | ICD-10-CM | POA: Diagnosis not present

## 2016-10-25 DIAGNOSIS — I10 Essential (primary) hypertension: Secondary | ICD-10-CM | POA: Diagnosis not present

## 2016-11-07 ENCOUNTER — Encounter: Payer: Self-pay | Admitting: Family Medicine

## 2016-11-07 ENCOUNTER — Ambulatory Visit (INDEPENDENT_AMBULATORY_CARE_PROVIDER_SITE_OTHER): Payer: Federal, State, Local not specified - PPO | Admitting: Family Medicine

## 2016-11-07 VITALS — BP 132/80 | HR 96 | Ht 62.0 in | Wt 181.0 lb

## 2016-11-07 DIAGNOSIS — M722 Plantar fascial fibromatosis: Secondary | ICD-10-CM

## 2016-11-07 DIAGNOSIS — R51 Headache: Secondary | ICD-10-CM

## 2016-11-07 DIAGNOSIS — M170 Bilateral primary osteoarthritis of knee: Secondary | ICD-10-CM | POA: Diagnosis not present

## 2016-11-07 DIAGNOSIS — M999 Biomechanical lesion, unspecified: Secondary | ICD-10-CM | POA: Diagnosis not present

## 2016-11-07 DIAGNOSIS — G4486 Cervicogenic headache: Secondary | ICD-10-CM

## 2016-11-07 NOTE — Assessment & Plan Note (Signed)
New problem Plantar Fascitis: We reviewed that stretching is critically important to the treatment of PF. Reviewed footwear. Rigid soles have been shown to help with PF. Night splints can help. Reviewed rehab of stretching and calf raises.  Could benefit from a corticosteroid injection, orthotics, or other measures if conservative treatment fails. RTC in 4 weeks.

## 2016-11-07 NOTE — Patient Instructions (Signed)
Good to see you.  Ice 20 minutes 2 times daily. Usually after activity and before bed. pennsaid pinkie amount topically 2 times daily as needed.  Vitamin C 1000 mg daily  Turmeric 500mg  twice daily  Elderberry tea.  Exercises 3 times a week.  Avoid being barefoot Sp[employer]co orthotics "total support" online would be great  Good shoes with rigid bottom.  Jalene Mullet, Merrell or New balance greater then 700 See me again in 4 weeks and if not better we can inject foot.

## 2016-11-07 NOTE — Progress Notes (Signed)
[name] [name], [place] [address] Phone: [phone] Subjective:    CC: right knee pain f/u, neck pain  [id number]  Dawn [name] is a 61 y.o. female coming in with complaint of Right knee pain. Patient was found to have a acute meniscal tear of the right knee. Patient was given an injection was to start formal physical therapy. Patient states that now she is having pain in both knees. Patient did have a surgical intervention with a meniscectomy on the right knee previously.  Patient though recently has had worsening knee pain. Was seen greater than 10 weeks ago and given injection both knees. Was doing very well but then unfortunately had to do a lot of walking right knee started to swell again. Given her pain. No increase in instability but unfortunately very painful. Especially going up and down stairs. Some mild nighttime pain.  Worsening neck pain has history of cervicogenic headaches. Worsening symptoms. States that she has had tightness. Has responded well to manipulation. No radicular symptoms. No weakness of the extremities. No new injury  Patient is also complaining of right heel pain. Been going on weeks. Worse with first steps in the morning or after sitting. Seems to better with activity. Denies any swelling. Does not remember any injury. Denies any numbness. Rates the severity of pain a 5 out of 10. No home modalities have been helpful.      Past Medical History:  Diagnosis Date  . Abnormal glucose   . Allergic rhinitis   . Anxiety   . Arthritis   . Carpal tunnel syndrome    right  . Cervical dysplasia    1996  . Endometriosis   . Fibroids   . Hemorrhoids   . High cholesterol   . Hyperlipemia   . Hypertension   . Migraine   . Neck pain   . Stroke [facility]) 2014   TIA  . Transient cerebral ischemia    10-22-12 ED, tia s/s but negative work up in ED. see note    Past Surgical History:  Procedure Laterality Date    . ABDOMINAL HYSTERECTOMY  Partial  . CARPAL TUNNEL RELEASE Right   . cold knife cone     for dysplasia  . COLONOSCOPY     last 11-2008 w/patterson, hems only   . KNEE ARTHROSCOPY WITH MEDIAL MENISECTOMY Right 03/28/2016   Procedure: RIGHT KNEE ARTHROSCOPY CHONDROPLASTY WITH MEDIAL MENISCECTOMY;  Surgeon: [name], MD;  Location: [facility];  Service: Orthopedics;  Laterality: Right;  . TRIGGER FINGER RELEASE     thumb, middle finger   . TUBAL LIGATION     Social History   Social History  . Marital status: Married    Spouse name: N/A  . Number of children: 1  . Years of education: N/A   Occupational History  . Retired    Social History Main Topics  . Smoking status: Never Smoker  . Smokeless tobacco: Never Used  . Alcohol use 0.6 oz/week    1 Glasses of wine per week     Comment: occasionally  . Drug use: No  . Sexual activity: No   Other Topics Concern  . Not on file   Social History Narrative  . No narrative on file   Allergies  Allergen Reactions  . Sulfa Antibiotics Rash   Family History  Problem Relation Age of Onset  . Colon cancer Father   . Stomach cancer Father   .  Diabetes Mother   . Hypertension Mother   . CVA Mother   . Kidney disease Mother   . Aneurysm Mother   . Colon cancer Maternal Uncle   . Breast cancer Maternal Aunt   . Kidney disease Maternal Aunt   . Heart disease Brother   . Throat cancer Maternal Uncle   . Rectal cancer Neg Hx     Past medical history, social, surgical and family history all reviewed in electronic medical record.  No pertanent information unless stated regarding to the chief complaint.   Review of Systems: No headache, visual changes, nausea, vomiting, diarrhea, constipation, dizziness, abdominal pain, skin rash, fevers, chills, night sweats, weight loss, swollen lymph nodes, body aches, joint swelling, muscle aches, chest pain, shortness of breath, mood changes.    Objective  Weight 181 lb  (82.1 kg).  Systems examined below as of 11/07/16 General: NAD A&O x3 mood, affect normal  HEENT: Pupils equal, extraocular movements intact no nystagmus Respiratory: not short of breath at rest or with speaking Cardiovascular: No lower extremity edema, non tender Skin: Warm dry intact with no signs of infection or rash on extremities or on axial skeleton. Abdomen: Soft nontender, no masses Neuro: Cranial nerves  intact, neurovascularly intact in all extremities with 2+ DTRs and 2+ pulses. Lymph: No lymphadenopathy appreciated today  Gait normal with good balance and coordination.  MSK: Non tender with full range of motion and good stability and symmetric strength and tone of shoulders, elbows, wrist,  knee hips and ankles bilaterally.    Gait antalgic gait  MSK:  Non tender with full range of motion and good stability and symmetric strength and tone of shoulders, elbows, wrist, hip, knee and ankles bilaterally. Mild arthritic changes Foot exam shows the patient does have pes planus. Mild overpronation of the hindfoot. Mild patellar transverse arch with splaying between the first and second toes. Tender to palpation over the medial calcaneal region. RIGHT foot.  Knee:Bilateral Arthritic changes with varus deformity significant thigh to calf ratio Worsening discomfort over the medial joint line right greater than left ROM full in flexion and extension and lower leg rotation. Valgus deformity noted and some instability of the right knee with varus force Mild painful patellar compression. Patellar glide with moderate crepitus. Patellar and quadriceps tendons unremarkable. Hamstring and quadriceps strength is normal.    Neck: Inspection unremarkable. No palpable stepoffs. Negative Spurling's maneuver. Full neck range of motion Grip strength and sensation normal in bilateral hands Strength good C4 to T1 distribution No sensory change to C4 to T1 Negative Hoffman sign  bilaterally Reflexes normal   After informed written and verbal consent, patient was seated on exam table. Right knee was prepped with alcohol swab and utilizing anterolateral approach, patient's right knee space was injected with 4:1  marcaine 0.5%: Kenalog 40mg /dL. Patient tolerated the procedure well without immediate complications.  Osteopathic findings Cervical C2 flexed rotated and side bent right C4 flexed rotated and side bent left C6 flexed rotated and side bent left T3 extended rotated and side bent right inhaled third rib T9 extended rotated and side bent left L2 flexed rotated and side bent right Sacrum right on right    Impression and Recommendations:     This case required medical decision making of moderate complexity.      Note: This dictation was prepared with Dragon dictation along with smaller phrase technology. Any transcriptional errors that result from this process are unintentional.

## 2016-11-07 NOTE — Assessment & Plan Note (Signed)
Given injection today and tolerated the procedure well. We discussed icing regimen and home exercises. We discussed which activities to do a which was to avoid. Continuing have patient be active. Follow-up again in 4 weeks. Could be a candidate for viscous supplementation.

## 2016-11-08 NOTE — Assessment & Plan Note (Signed)
Decision today to treat with OMT was based on Physical Exam  After verbal consent patient was treated with HVLA, ME, FPR techniques in cervical, thoracic, lumbar and sacral areas  Patient tolerated the procedure well with improvement in symptoms  Patient given exercises, stretches and lifestyle modifications  See medications in patient instructions if given  Patient will follow up in 3-4 weeks  

## 2016-11-08 NOTE — Assessment & Plan Note (Signed)
Respond once again to manipulation. Encourage patient to work on posture, ergonomics, which activities doing which ones to avoid. Patient will continue to be active. Follow-up with me again in 4 weeks

## 2016-12-03 ENCOUNTER — Ambulatory Visit (INDEPENDENT_AMBULATORY_CARE_PROVIDER_SITE_OTHER): Payer: Federal, State, Local not specified - PPO | Admitting: Family Medicine

## 2016-12-03 ENCOUNTER — Encounter: Payer: Self-pay | Admitting: Family Medicine

## 2016-12-03 VITALS — BP 134/80 | HR 90 | Ht 62.0 in | Wt 183.0 lb

## 2016-12-03 DIAGNOSIS — M170 Bilateral primary osteoarthritis of knee: Secondary | ICD-10-CM

## 2016-12-03 DIAGNOSIS — M999 Biomechanical lesion, unspecified: Secondary | ICD-10-CM

## 2016-12-03 DIAGNOSIS — R51 Headache: Secondary | ICD-10-CM

## 2016-12-03 DIAGNOSIS — M722 Plantar fascial fibromatosis: Secondary | ICD-10-CM

## 2016-12-03 DIAGNOSIS — G4486 Cervicogenic headache: Secondary | ICD-10-CM

## 2016-12-03 NOTE — Patient Instructions (Signed)
Goo to see you  Increase the turmeric or try the [employer] pills [employer]oka shoes at [employer] may be good Look into A to Zen massage See me again in 4-6 weeks'

## 2016-12-03 NOTE — Assessment & Plan Note (Signed)
Decision today to treat with OMT was based on Physical Exam  After verbal consent patient was treated with HVLA, ME, FPR techniques in cervical, thoracic, rib lumbar and sacral areas  Patient tolerated the procedure well with improvement in symptoms  Patient given exercises, stretches and lifestyle modifications  See medications in patient instructions if given  Patient will follow up in 4-8 weeks 

## 2016-12-03 NOTE — Assessment & Plan Note (Signed)
Stable and moment. No sensory change. Worsening symptoms patient would be a candidate for viscous supplementation.

## 2016-12-03 NOTE — Assessment & Plan Note (Signed)
Stable and moment. Patient was having increasing stiffness of being secondary to be more active this weekend. Patient has different medications for breakthrough. We discussed proper lifting mechanics and ergonomics. Patient will come back and see me again in 4-8 weeks.

## 2016-12-03 NOTE — Progress Notes (Signed)
[name] [address] [place], [place] [address] Phone: [phone] Subjective:    CC: right knee pain f/u, neck pain  [id number]  Dawn [name] is a 61 y.o. female coming in with complaint of Right knee pain. Patient was found to have a acute meniscal tear of the right knee. Patient was given an injection was to start formal physical therapy. Patient states that now she is having pain in both knees. Patient did have a surgical intervention with a meniscectomy on the right knee previously.  Patient was seen by me one month ago and was given an injection again in the right knee.Patient states that is doing much better. States that there is not as much swelling. Feels like she is doing relatively well.   Worsening neck pain has history of cervicogenic headaches. Patient was having worsening symptoms and does respond fairly well to osteopathic manipulation. Patient states She was taking care of her mother this weekend and unfortunately constant worsening discomfort.  Patient is also complaining of right heel pain. Found to have more of a plantar fasciitis. Patient was to start doing home exercises, change shoes, over-the-counter orthotics. Patient states doing better overall, new shoes have helped.       Past Medical History:  Diagnosis Date  . Abnormal glucose   . Allergic rhinitis   . Anxiety   . Arthritis   . Carpal tunnel syndrome    right  . Cervical dysplasia    1996  . Endometriosis   . Fibroids   . Hemorrhoids   . High cholesterol   . Hyperlipemia   . Hypertension   . Migraine   . Neck pain   . Stroke [facility]) 2014   TIA  . Transient cerebral ischemia    10-22-12 ED, tia s/s but negative work up in ED. see note    Past Surgical History:  Procedure Laterality Date  . ABDOMINAL HYSTERECTOMY  Partial  . CARPAL TUNNEL RELEASE Right   . cold knife cone     for dysplasia  . COLONOSCOPY     last 11-2008 w/patterson, hems only   . KNEE  ARTHROSCOPY WITH MEDIAL MENISECTOMY Right 03/28/2016   Procedure: RIGHT KNEE ARTHROSCOPY CHONDROPLASTY WITH MEDIAL MENISCECTOMY;  Surgeon: [name], MD;  Location: [facility];  Service: Orthopedics;  Laterality: Right;  . TRIGGER FINGER RELEASE     thumb, middle finger   . TUBAL LIGATION     Social History   Social History  . Marital status: Married    Spouse name: N/A  . Number of children: 1  . Years of education: N/A   Occupational History  . Retired    Social History Main Topics  . Smoking status: Never Smoker  . Smokeless tobacco: Never Used  . Alcohol use 0.6 oz/week    1 Glasses of wine per week     Comment: occasionally  . Drug use: No  . Sexual activity: No   Other Topics Concern  . Not on file   Social History Narrative  . No narrative on file   Allergies  Allergen Reactions  . Sulfa Antibiotics Rash   Family History  Problem Relation Age of Onset  . Colon cancer Father   . Stomach cancer Father   . Diabetes Mother   . Hypertension Mother   . CVA Mother   . Kidney disease Mother   . Aneurysm Mother   . Colon cancer Maternal Uncle   .  Breast cancer Maternal Aunt   . Kidney disease Maternal Aunt   . Heart disease Brother   . Throat cancer Maternal Uncle   . Rectal cancer Neg Hx     Past medical history, social, surgical and family history all reviewed in electronic medical record.  No pertanent information unless stated regarding to the chief complaint.   Review of Systems: No headache, visual changes, nausea, vomiting, diarrhea, constipation, dizziness, abdominal pain, skin rash, fevers, chills, night sweats, weight loss, swollen lymph nodes, body aches, joint swelling chest pain, shortness of breath, mood changes.   Positive muscle aches  Objective  There were no vitals taken for this visit.  Systems examined below as of 12/03/16 General: NAD A&O x3 mood, affect normal  HEENT: Pupils equal, extraocular movements intact no  nystagmus Respiratory: not short of breath at rest or with speaking Cardiovascular: No lower extremity edema, non tender Skin: Warm dry intact with no signs of infection or rash on extremities or on axial skeleton. Abdomen: Soft nontender, no masses Neuro: Cranial nerves  intact, neurovascularly intact in all extremities with 2+ DTRs and 2+ pulses. Lymph: No lymphadenopathy appreciated today  Gait normal with good balance and coordination.   Gait antalgic gait mild improvement.  MSK:  Non tender with full range of motion and good stability and symmetric strength and tone of shoulders, elbows, wrist, hip, and ankles bilaterally. Mild arthritic changes Foot exam shows the patient does have pes planus. Mild overpronation of the hindfoot. No pain on exam today.  Knee:Bilateral Arthritic changes with varus deformity significant thigh to calf ratio Minimal tenderness on exam today. ROM full in flexion and extension and lower leg rotation. Valgus deformity noted and some instability of the right knee with varus force Mild painful patellar compression. Patellar glide with minimal crepitus. Patellar and quadriceps tendons unremarkable. Hamstring and quadriceps strength is normal.    Neck: Inspection unremarkable. No palpable stepoffs. Negative Spurling's maneuver. Decreased range of motion lacking the last 5 in all planes Grip strength and sensation normal in bilateral hands Strength good C4 to T1 distribution No sensory change to C4 to T1 Negative Hoffman sign bilaterally Reflexes normal  Osteopathic findings Cervical C2 flexed rotated and side bent right T4 extended rotated and side bent right inhaled rib T9 extended rotated and side bent right  L4 flexed rotated and side bent right Sacrum left on left     Impression and Recommendations:     This case required medical decision making of moderate complexity.      Note: This dictation was prepared with Dragon dictation  along with smaller phrase technology. Any transcriptional errors that result from this process are unintentional.

## 2016-12-03 NOTE — Assessment & Plan Note (Signed)
Improvement. Discussed other issues a could be beneficial.

## 2016-12-05 ENCOUNTER — Ambulatory Visit: Payer: Federal, State, Local not specified - PPO | Admitting: Family Medicine

## 2016-12-13 DIAGNOSIS — H43812 Vitreous degeneration, left eye: Secondary | ICD-10-CM | POA: Diagnosis not present

## 2017-01-13 NOTE — Progress Notes (Signed)
[name] [address] [place], [place] [address] Phone: [phone] Subjective:    CC: right knee pain f/u, neck pain  UKG:[id number]  Dawn [name] is a 61 y.o. female coming in with complaint of Right knee pain. Patient was found to have a acute meniscal tear of the right knee. States the patient is doing relatively well. Continues to do decent. Does not have any severe pain or locking.   Worsening neck pain has history of cervicogenic headaches. Patient was having worsening symptoms and does respond fairly well to osteopathic manipulation. Patient has had some mild increase in stress recently and is having more pain. Patient denies any radiation down the arm. Feels like the same problem she's had previously is worsening symptoms.  Patient is also complaining of right heel pain. Found to have more of a plantar fasciitis. Doing the over-the-counter orthotics and the home exercises. Continuing have pain but maybe a little bit better.      Past Medical History:  Diagnosis Date  . Abnormal glucose   . Allergic rhinitis   . Anxiety   . Arthritis   . Carpal tunnel syndrome    right  . Cervical dysplasia    1996  . Endometriosis   . Fibroids   . Hemorrhoids   . High cholesterol   . Hyperlipemia   . Hypertension   . Migraine   . Neck pain   . Stroke [facility]) 2014   TIA  . Transient cerebral ischemia    10-22-12 ED, tia s/s but negative work up in ED. see note    Past Surgical History:  Procedure Laterality Date  . ABDOMINAL HYSTERECTOMY  Partial  . CARPAL TUNNEL RELEASE Right   . cold knife cone     for dysplasia  . COLONOSCOPY     last 11-2008 w/[facility], hems only   . KNEE ARTHROSCOPY WITH MEDIAL MENISECTOMY Right 03/28/2016   Procedure: RIGHT KNEE ARTHROSCOPY CHONDROPLASTY WITH MEDIAL MENISCECTOMY;  Surgeon: [name], MD;  Location: [place];  Service: Orthopedics;  Laterality: Right;  . TRIGGER FINGER RELEASE     thumb, middle finger   . TUBAL LIGATION     Social History   Social History  . Marital status: Married    Spouse name: N/A  . Number of children: 1  . Years of education: N/A   Occupational History  . Retired    Social History Main Topics  . Smoking status: Never Smoker  . Smokeless tobacco: Never Used  . Alcohol use 0.6 oz/week    1 Glasses of wine per week     Comment: occasionally  . Drug use: No  . Sexual activity: No   Other Topics Concern  . None   Social History Narrative  . None   Allergies  Allergen Reactions  . Sulfa Antibiotics Rash   Family History  Problem Relation Age of Onset  . Colon cancer Father   . Stomach cancer Father   . Diabetes Mother   . Hypertension Mother   . CVA Mother   . Kidney disease Mother   . Aneurysm Mother   . Colon cancer Maternal Uncle   . Breast cancer Maternal Aunt   . Kidney disease Maternal Aunt   . Heart disease Brother   . Throat cancer Maternal Uncle   . Rectal cancer Neg Hx     Past medical history, social, surgical and family history all reviewed in electronic medical record.  No pertanent information unless stated regarding to the chief complaint.   Review of Systems: No headache, visual changes, nausea, vomiting, diarrhea, constipation, dizziness, abdominal pain, skin rash, fevers, chills, night sweats, weight loss, swollen lymph nodes, body aches, joint swelling,  chest pain, shortness of breath, mood changes.  Positive muscle aches  Objective  Blood pressure 112/76, pulse 96, resp. rate 16, weight 180 lb (81.6 kg), SpO2 96 %.  Systems examined below as of 01/14/17 General: NAD A&O x3 mood, affect normal  HEENT: Pupils equal, extraocular movements intact no nystagmus Respiratory: not short of breath at rest or with speaking Cardiovascular: No lower extremity edema, non tender Skin: Warm dry intact with no signs of infection or rash on extremities or on axial skeleton. Abdomen: Soft nontender, no  masses Neuro: Cranial nerves  intact, neurovascularly intact in all extremities with 2+ DTRs and 2+ pulses. Lymph: No lymphadenopathy appreciated today  Gait normal with good balance and coordination.  MSK: Non tender with full range of motion and good stability and symmetric strength and tone of shoulders, elbows, wrist,  hips and ankles bilaterally.   Foot exam shows the patient does have pes planus. Mild overpronation of the hindfoot. Mild pain over the medial calcaneal region today.  Knee: Bilateral valgus deformity noted. Large thigh to calf ratio.  Tender to palpation over medial and PF joint line.  ROM full in flexion and extension and lower leg rotation. instability with valgus force.  painful patellar compression. Patellar glide with moderate crepitus. Patellar and quadriceps tendons unremarkable. Hamstring and quadriceps strength is normal.   Neck: Inspection unremarkable. No palpable stepoffs. Negative Spurling's maneuver. Decreased range of motion lacking the last 10 of extension. Grip strength and sensation normal in bilateral hands Strength good C4 to T1 distribution No sensory change to C4 to T1 Negative Hoffman sign bilaterally Reflexes normal  Osteopathic findings Cervical C2 flexed rotated and side bent right C5 flexed rotated and side bent left T3 extended rotated and side bent right inhaled third rib T7 extended rotated and side bent left L2 flexed rotated and side bent right Sacrum right on right     Impression and Recommendations:     This case required medical decision making of moderate complexity.      Note: This dictation was prepared with Dragon dictation along with smaller phrase technology. Any transcriptional errors that result from this process are unintentional.

## 2017-01-14 ENCOUNTER — Ambulatory Visit (INDEPENDENT_AMBULATORY_CARE_PROVIDER_SITE_OTHER): Payer: Federal, State, Local not specified - PPO | Admitting: Family Medicine

## 2017-01-14 ENCOUNTER — Encounter: Payer: Self-pay | Admitting: Family Medicine

## 2017-01-14 VITALS — BP 112/76 | HR 96 | Resp 16 | Wt 180.0 lb

## 2017-01-14 DIAGNOSIS — M999 Biomechanical lesion, unspecified: Secondary | ICD-10-CM

## 2017-01-14 DIAGNOSIS — R51 Headache: Secondary | ICD-10-CM | POA: Diagnosis not present

## 2017-01-14 DIAGNOSIS — M170 Bilateral primary osteoarthritis of knee: Secondary | ICD-10-CM

## 2017-01-14 DIAGNOSIS — G4486 Cervicogenic headache: Secondary | ICD-10-CM

## 2017-01-14 NOTE — Assessment & Plan Note (Signed)
Continued to consider ergonomics. We discussed home exercise, icing regimen. Patient has done fairly well with 48 week intervals.

## 2017-01-14 NOTE — Patient Instructions (Signed)
Good to see you  Ice still your friend.  Wear the heel lift and see if that helps If not we may need a brace or injections.  On wall with heels, butt shoulder and head touching for a goal of 5 minutes daily  Try the nose spray each nostril daily for 2 weeks See em again in 4-6 weeks

## 2017-01-14 NOTE — Assessment & Plan Note (Signed)
Stable. We'll continue to monitor. 

## 2017-01-14 NOTE — Progress Notes (Signed)
Pre-visit discussion using our clinic review tool. No additional management support is needed unless otherwise documented below in the visit note.  

## 2017-01-14 NOTE — Assessment & Plan Note (Signed)
Decision today to treat with OMT was based on Physical Exam  After verbal consent patient was treated with HVLA, ME, FPR techniques in cervical, rib, thoracic, lumbar and sacral areas  Patient tolerated the procedure well with improvement in symptoms  Patient given exercises, stretches and lifestyle modifications  See medications in patient instructions if given  Patient will follow up in 4-8 weeks 

## 2017-01-15 DIAGNOSIS — H43812 Vitreous degeneration, left eye: Secondary | ICD-10-CM | POA: Diagnosis not present

## 2017-01-21 DIAGNOSIS — M722 Plantar fascial fibromatosis: Secondary | ICD-10-CM | POA: Diagnosis not present

## 2017-01-21 DIAGNOSIS — M1711 Unilateral primary osteoarthritis, right knee: Secondary | ICD-10-CM | POA: Diagnosis not present

## 2017-02-04 DIAGNOSIS — H43821 Vitreomacular adhesion, right eye: Secondary | ICD-10-CM | POA: Diagnosis not present

## 2017-02-25 ENCOUNTER — Ambulatory Visit: Payer: Federal, State, Local not specified - PPO | Admitting: Family Medicine

## 2017-03-03 DIAGNOSIS — R05 Cough: Secondary | ICD-10-CM | POA: Diagnosis not present

## 2017-03-03 DIAGNOSIS — J019 Acute sinusitis, unspecified: Secondary | ICD-10-CM | POA: Diagnosis not present

## 2017-03-06 DIAGNOSIS — R0981 Nasal congestion: Secondary | ICD-10-CM | POA: Diagnosis not present

## 2017-03-06 DIAGNOSIS — J069 Acute upper respiratory infection, unspecified: Secondary | ICD-10-CM | POA: Diagnosis not present

## 2017-05-22 DIAGNOSIS — I10 Essential (primary) hypertension: Secondary | ICD-10-CM | POA: Diagnosis not present

## 2017-05-22 DIAGNOSIS — M5412 Radiculopathy, cervical region: Secondary | ICD-10-CM | POA: Diagnosis not present

## 2017-05-22 DIAGNOSIS — R7303 Prediabetes: Secondary | ICD-10-CM | POA: Diagnosis not present

## 2017-05-22 DIAGNOSIS — E7800 Pure hypercholesterolemia, unspecified: Secondary | ICD-10-CM | POA: Diagnosis not present

## 2017-05-22 DIAGNOSIS — K219 Gastro-esophageal reflux disease without esophagitis: Secondary | ICD-10-CM | POA: Diagnosis not present

## 2017-05-22 DIAGNOSIS — J301 Allergic rhinitis due to pollen: Secondary | ICD-10-CM | POA: Diagnosis not present

## 2017-05-22 DIAGNOSIS — Z0000 Encounter for general adult medical examination without abnormal findings: Secondary | ICD-10-CM | POA: Diagnosis not present

## 2017-06-02 IMAGING — MR MR KNEE*L* W/O CM
4 of 6 series · 19 of 40 positions shown · non-contrast
Comparison: None.

CLINICAL DATA: Fell down steps at home 2 months ago. Persistent
pain.

EXAM:
MRI OF THE LEFT KNEE WITHOUT CONTRAST
TECHNIQUE: Multiplanar, multisequence MR imaging of the knee was performed. No
intravenous contrast was administered.

[Series 3: PD fat-sat · axial · 4.0mm · 0.29mm/px · z∈[-53,+57]mm · 7 of 23 slices shown (1 of 4)]
[im 1/23]
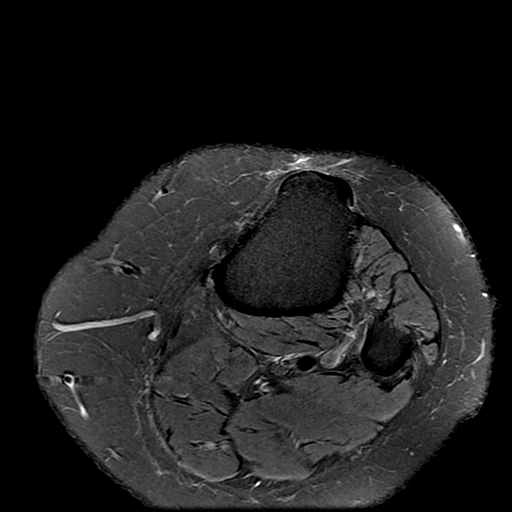
[im 4/23]
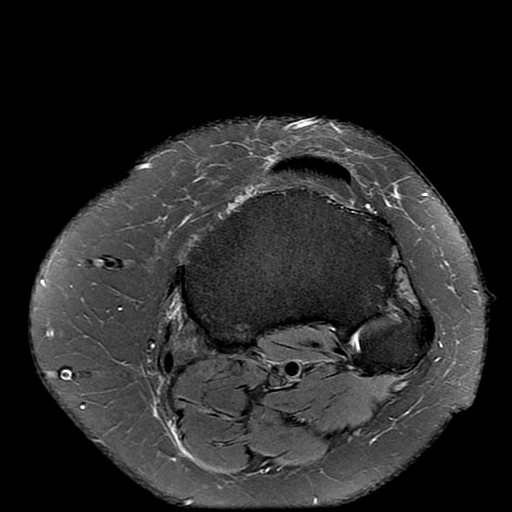
[im 8/23]
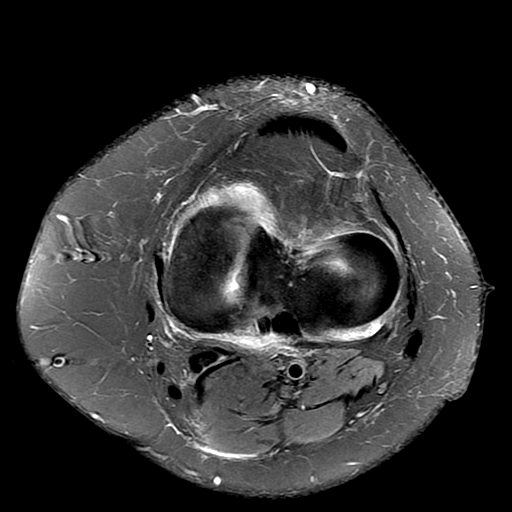
[im 12/23]
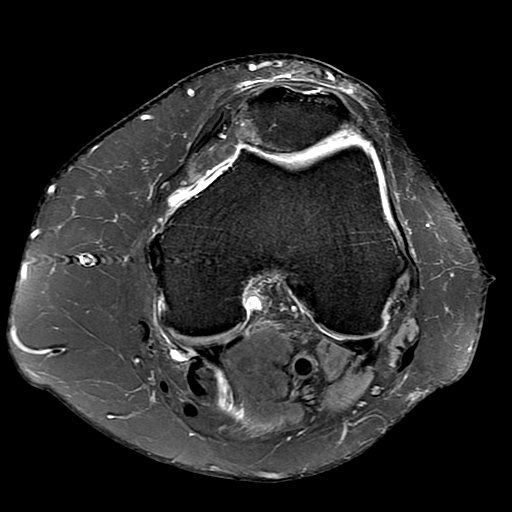
[im 15/23]
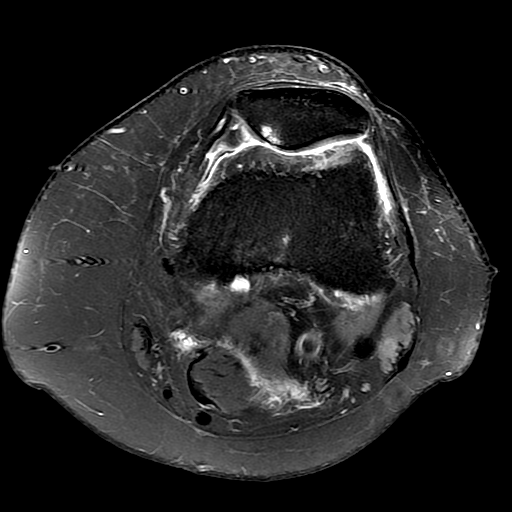
[im 19/23]
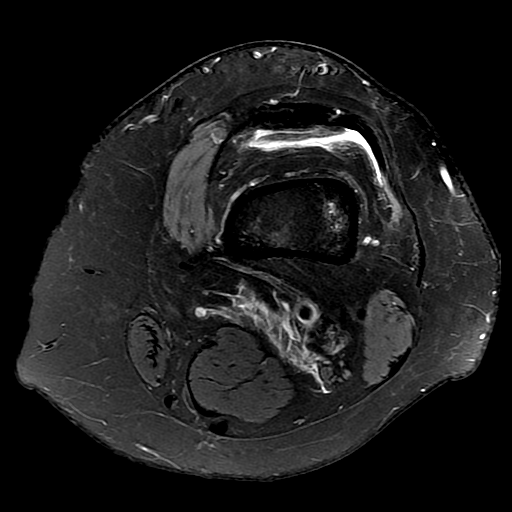
[im 23/23]
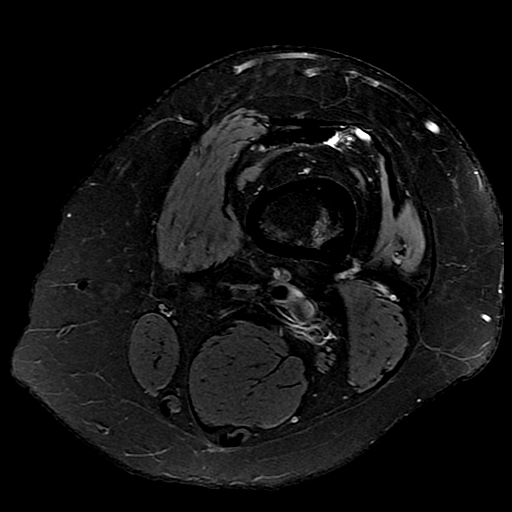

[Series 4: PD fat-sat · sagittal · 3.0mm · 0.29mm/px · 6 of 27 slices shown (2 of 4)]
[im 1/27]
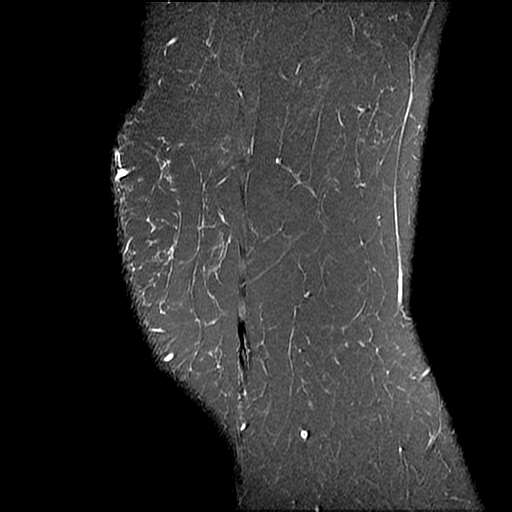
[im 4/27]
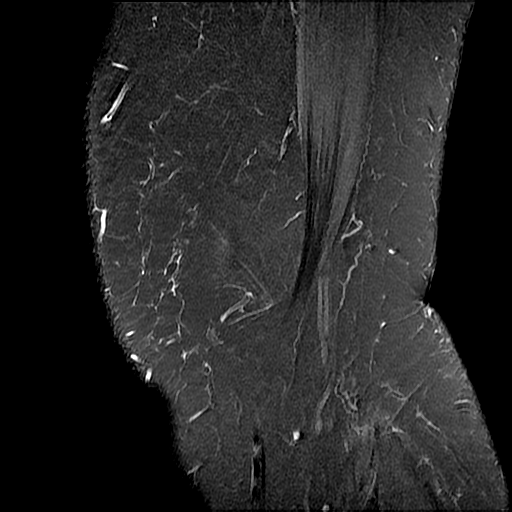
[im 8/27]
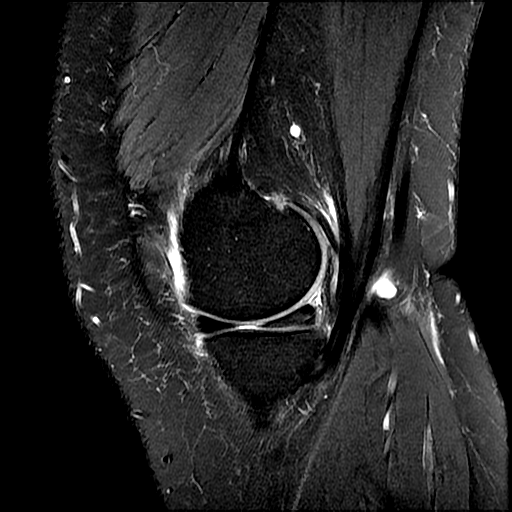
[im 12/27]
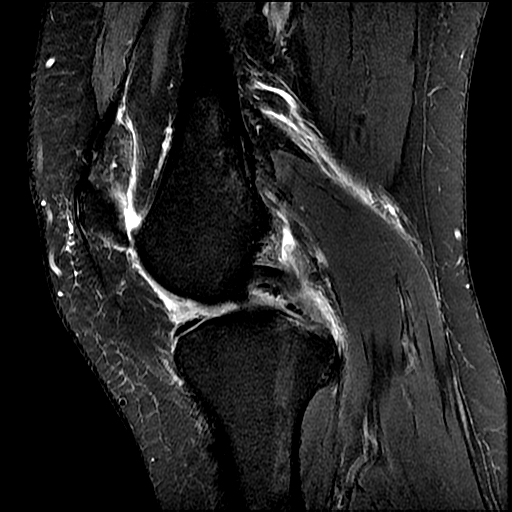
[im 15/27]
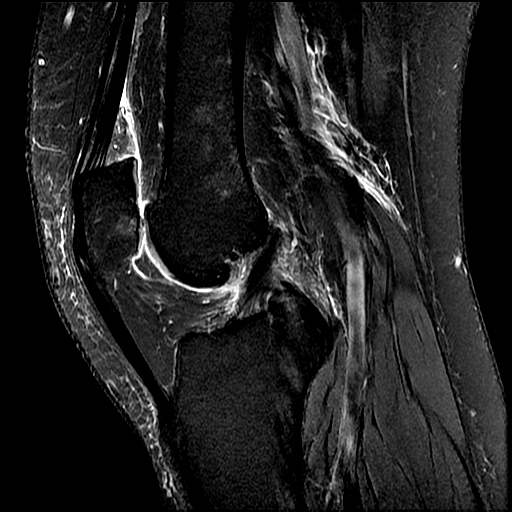
[im 23/27]
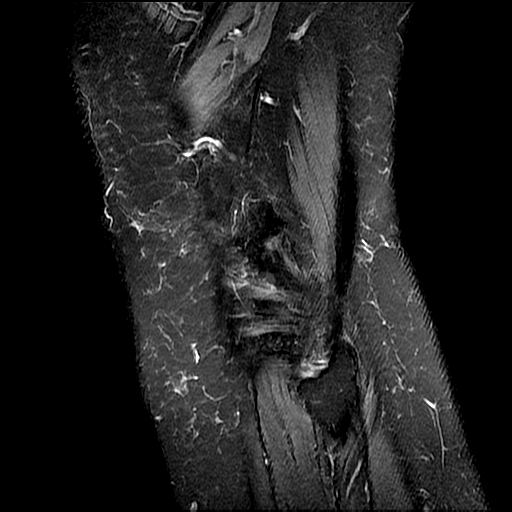

[Series 7: PD fat-sat · coronal · 4.0mm · 0.31mm/px · 3 of 24 slices shown (3 of 4)]
[im 4/24]
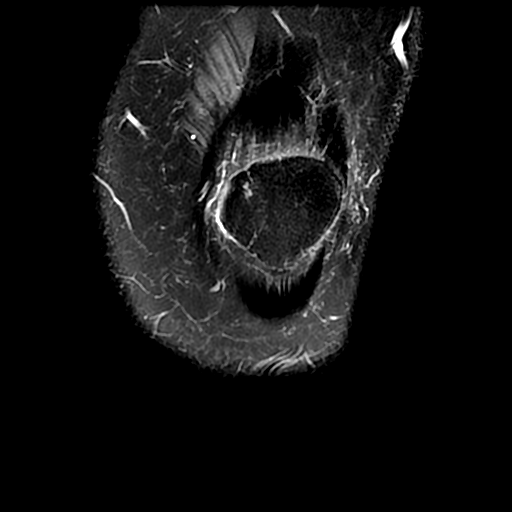
[im 12/24]
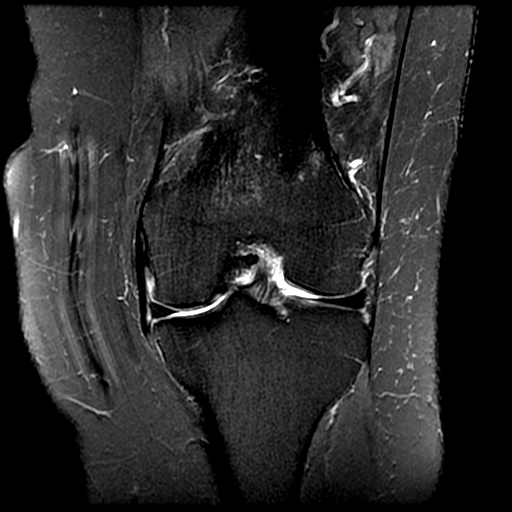
[im 20/24]
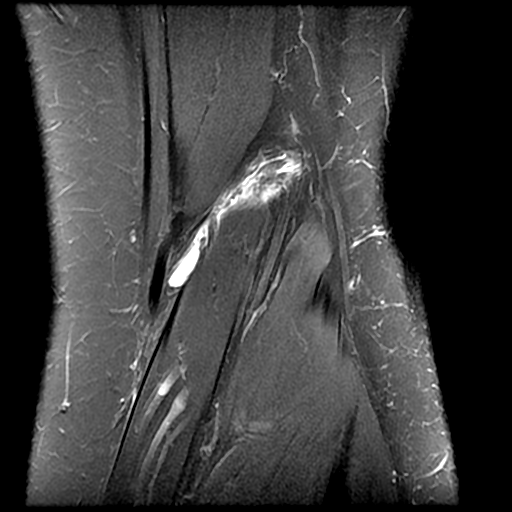

[Series 8: PD fat-sat · coronal · 2.0mm · 0.31mm/px · 3 of 13 slices shown (4 of 4)]
[im 1/13]
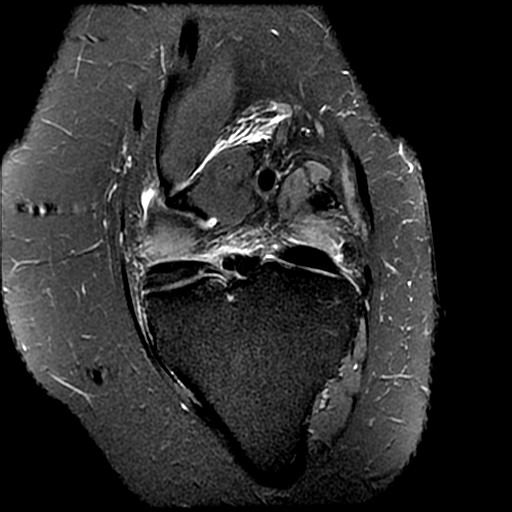
[im 9/13]
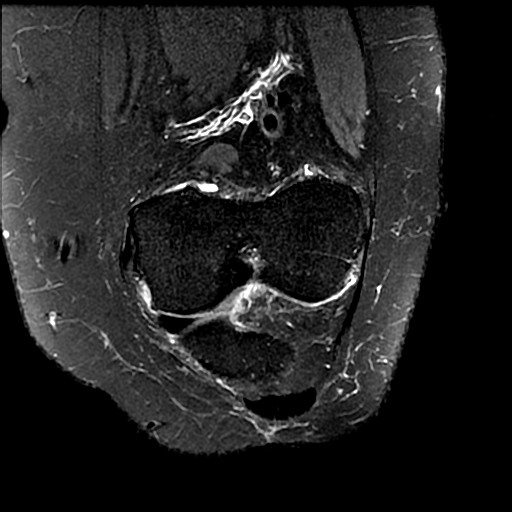
[im 13/13]
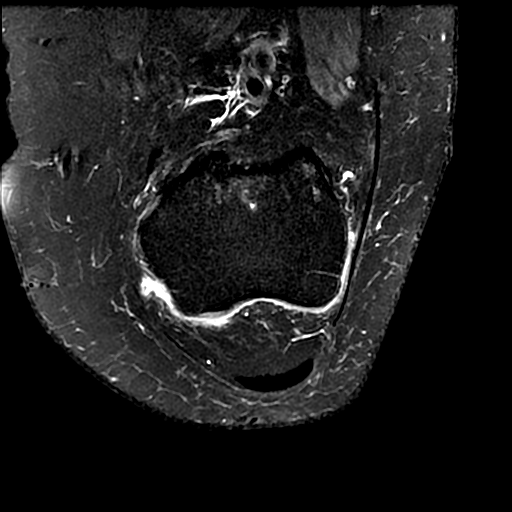

[19 of 40 positions shown; findings below may reference images not displayed]

FINDINGS: MENISCI

Medial meniscus: Oblique coursing inferior articular surface tear
involving the posterior horn near the meniscal root. No complete
detachment or medial protrusion of the meniscus. There is also a
small peripheral inferior articular surface flap type tear with a
small meniscal fragment in the medial gutter.

Lateral meniscus:  Intact.

LIGAMENTS

Cruciates:  Intact.

Collaterals:  Intact.  Mild MCL and pes anserine bursitis.

CARTILAGE

Patellofemoral: Moderate to advanced degenerative chondrosis with
areas of grade 3 and grade 4 chondromalacia. This is most
significant along the patellar apex and medial facet.

Medial:  Mild degenerative chondrosis.

Lateral:  Mild degenerative chondrosis.

Joint: Small joint effusion and mild synovitis. Superior and medial
patellar plica are noted.

Popliteal Fossa:  Small leaking Baker's cyst.

Extensor Mechanism: The patella retinacular structures are intact
and the quadriceps and patellar tendons are intact.

Bones:  No acute bony findings.  No osteochondral lesion.
IMPRESSION: 1. Medial meniscus tears as described above.
2. Intact ligamentous structures and no acute bony findings.
3. Tricompartmental degenerative changes most significant at the
patellofemoral joint.
4. Mild MCL and pes anserine bursitis.
5. Small joint effusion with mild synovitis and superior and medial
patellar plica.
6. Small leaking Baker's cyst.

## 2017-06-17 DIAGNOSIS — K080 Exfoliation of teeth due to systemic causes: Secondary | ICD-10-CM | POA: Diagnosis not present

## 2017-07-03 ENCOUNTER — Emergency Department (HOSPITAL_COMMUNITY): Payer: Federal, State, Local not specified - PPO

## 2017-07-03 ENCOUNTER — Encounter (HOSPITAL_COMMUNITY): Payer: Self-pay | Admitting: Emergency Medicine

## 2017-07-03 ENCOUNTER — Emergency Department (HOSPITAL_COMMUNITY)
Admission: EM | Admit: 2017-07-03 | Discharge: 2017-07-03 | Disposition: A | Discharge status: Home / Self Care | Payer: Federal, State, Local not specified - PPO | Attending: Emergency Medicine | Admitting: Emergency Medicine

## 2017-07-03 DIAGNOSIS — Z5321 Procedure and treatment not carried out due to patient leaving prior to being seen by health care provider: Secondary | ICD-10-CM | POA: Diagnosis not present

## 2017-07-03 DIAGNOSIS — R51 Headache: Secondary | ICD-10-CM | POA: Diagnosis not present

## 2017-07-03 DIAGNOSIS — R079 Chest pain, unspecified: Secondary | ICD-10-CM | POA: Diagnosis not present

## 2017-07-03 LAB — BASIC METABOLIC PANEL
Anion gap: 8 (ref 5–15)
BUN: 13 mg/dL (ref 6–20)
CALCIUM: 9.3 mg/dL (ref 8.9–10.3)
CHLORIDE: 106 mmol/L (ref 101–111)
CO2: 25 mmol/L (ref 22–32)
CREATININE: 0.7 mg/dL (ref 0.44–1.00)
GFR calc Af Amer: >60 mL/min (ref >60)
GFR calc non Af Amer: >60 mL/min (ref >60)
GLUCOSE: 138 mg/dL — AB (ref 65–99)
POTASSIUM: 3.5 mmol/L (ref 3.5–5.1)
SODIUM: 139 mmol/L (ref 135–145)

## 2017-07-03 LAB — DIFFERENTIAL
BASOS ABS: 0 10*3/uL (ref 0.0–0.1)
BASOS PCT: 0 %
Eosinophils Absolute: 0.2 10*3/uL (ref 0.0–0.7)
Eosinophils Relative: 3 %
LYMPHS PCT: 39 %
Lymphs Abs: 2.4 10*3/uL (ref 0.7–4.0)
Monocytes Absolute: 0.4 10*3/uL (ref 0.1–1.0)
Monocytes Relative: 6 %
NEUTROS ABS: 3.3 10*3/uL (ref 1.7–7.7)
Neutrophils Relative %: 52 %

## 2017-07-03 LAB — I-STAT CHEM 8, ED
BUN: 13 mg/dL (ref 6–20)
CALCIUM ION: 1.19 mmol/L (ref 1.15–1.40)
CHLORIDE: 104 mmol/L (ref 101–111)
Creatinine, Ser: 0.7 mg/dL (ref 0.44–1.00)
Glucose, Bld: 133 mg/dL — ABNORMAL HIGH (ref 65–99)
HEMATOCRIT: 39 % (ref 36.0–46.0)
Hemoglobin: 13.3 g/dL (ref 12.0–15.0)
POTASSIUM: 3.3 mmol/L — AB (ref 3.5–5.1)
Sodium: 140 mmol/L (ref 135–145)
TCO2: 26 mmol/L (ref 22–32)

## 2017-07-03 LAB — CBC
HCT: 38.4 % (ref 36.0–46.0)
HEMOGLOBIN: 12.6 g/dL (ref 12.0–15.0)
MCH: 26.3 pg (ref 26.0–34.0)
MCHC: 32.8 g/dL (ref 30.0–36.0)
MCV: 80 fL (ref 78.0–100.0)
Platelets: 203 10*3/uL (ref 150–400)
RBC: 4.8 MIL/uL (ref 3.87–5.11)
RDW: 14.9 % (ref 11.5–15.5)
WBC: 6.3 10*3/uL (ref 4.0–10.5)

## 2017-07-03 LAB — APTT: APTT: 27 s (ref 24–36)

## 2017-07-03 LAB — PROTIME-INR
INR: 0.88
Prothrombin Time: 11.9 seconds (ref 11.4–15.2)

## 2017-07-03 LAB — I-STAT TROPONIN, ED: TROPONIN I, POC: 0 ng/mL (ref 0.00–0.08)

## 2017-07-03 NOTE — ED Triage Notes (Addendum)
Pt verbalizes right headache worsening since Monday; new onset of dizziness, chest pain, and slurred speech today post taking son to his appointment 40 minutes ago; denies symptoms currently. No weakness/numbness with triage.

## 2017-07-04 DIAGNOSIS — J3489 Other specified disorders of nose and nasal sinuses: Secondary | ICD-10-CM | POA: Diagnosis not present

## 2017-07-04 DIAGNOSIS — R51 Headache: Secondary | ICD-10-CM | POA: Diagnosis not present

## 2017-07-10 DIAGNOSIS — Z780 Asymptomatic menopausal state: Secondary | ICD-10-CM | POA: Diagnosis not present

## 2017-07-21 DIAGNOSIS — H25813 Combined forms of age-related cataract, bilateral: Secondary | ICD-10-CM | POA: Diagnosis not present

## 2017-07-21 DIAGNOSIS — H524 Presbyopia: Secondary | ICD-10-CM | POA: Diagnosis not present

## 2017-07-21 DIAGNOSIS — H43812 Vitreous degeneration, left eye: Secondary | ICD-10-CM | POA: Diagnosis not present

## 2017-07-21 DIAGNOSIS — H04123 Dry eye syndrome of bilateral lacrimal glands: Secondary | ICD-10-CM | POA: Diagnosis not present

## 2017-08-13 DIAGNOSIS — J069 Acute upper respiratory infection, unspecified: Secondary | ICD-10-CM | POA: Diagnosis not present

## 2017-11-07 ENCOUNTER — Ambulatory Visit (INDEPENDENT_AMBULATORY_CARE_PROVIDER_SITE_OTHER): Payer: Federal, State, Local not specified - PPO | Admitting: Family Medicine

## 2017-11-07 ENCOUNTER — Other Ambulatory Visit (INDEPENDENT_AMBULATORY_CARE_PROVIDER_SITE_OTHER): Payer: Federal, State, Local not specified - PPO

## 2017-11-07 ENCOUNTER — Ambulatory Visit (INDEPENDENT_AMBULATORY_CARE_PROVIDER_SITE_OTHER)
Admission: RE | Admit: 2017-11-07 | Discharge: 2017-11-07 | Disposition: A | Discharge status: Home / Self Care | Payer: Federal, State, Local not specified - PPO | Source: Ambulatory Visit | Attending: Family Medicine | Admitting: Family Medicine

## 2017-11-07 ENCOUNTER — Ambulatory Visit: Payer: Self-pay

## 2017-11-07 VITALS — BP 122/80 | HR 92 | Ht 62.0 in | Wt 181.0 lb

## 2017-11-07 DIAGNOSIS — M999 Biomechanical lesion, unspecified: Secondary | ICD-10-CM | POA: Diagnosis not present

## 2017-11-07 DIAGNOSIS — G8929 Other chronic pain: Secondary | ICD-10-CM

## 2017-11-07 DIAGNOSIS — M549 Dorsalgia, unspecified: Secondary | ICD-10-CM

## 2017-11-07 DIAGNOSIS — M2550 Pain in unspecified joint: Secondary | ICD-10-CM

## 2017-11-07 DIAGNOSIS — M25561 Pain in right knee: Secondary | ICD-10-CM | POA: Diagnosis not present

## 2017-11-07 DIAGNOSIS — M545 Low back pain, unspecified: Secondary | ICD-10-CM

## 2017-11-07 DIAGNOSIS — M170 Bilateral primary osteoarthritis of knee: Secondary | ICD-10-CM

## 2017-11-07 LAB — URIC ACID: URIC ACID, SERUM: 5.8 mg/dL (ref 2.4–7.0)

## 2017-11-07 LAB — CBC WITH DIFFERENTIAL/PLATELET
BASOS ABS: 62 {cells}/uL (ref 0–200)
Basophils Relative: 1.4 %
EOS ABS: 110 {cells}/uL (ref 15–500)
EOS PCT: 2.5 %
HCT: 38.5 % (ref 35.0–45.0)
HEMOGLOBIN: 12.8 g/dL (ref 11.7–15.5)
Lymphs Abs: 1663 cells/uL (ref 850–3900)
MCH: 25.8 pg — AB (ref 27.0–33.0)
MCHC: 33.2 g/dL (ref 32.0–36.0)
MCV: 77.6 fL — ABNORMAL LOW (ref 80.0–100.0)
MONOS PCT: 10 %
MPV: 10.8 fL (ref 7.5–12.5)
NEUTROS ABS: 2125 {cells}/uL (ref 1500–7800)
NEUTROS PCT: 48.3 %
Platelets: 226 10*3/uL (ref 140–400)
RBC: 4.96 10*6/uL (ref 3.80–5.10)
RDW: 13.8 % (ref 11.0–15.0)
TOTAL LYMPHOCYTE: 37.8 %
WBC mixed population: 440 cells/uL (ref 200–950)
WBC: 4.4 10*3/uL (ref 3.8–10.8)

## 2017-11-07 LAB — C-REACTIVE PROTEIN: CRP: 0.2 mg/dL — AB (ref 0.5–20.0)

## 2017-11-07 LAB — COMPREHENSIVE METABOLIC PANEL
ALBUMIN: 4.1 g/dL (ref 3.5–5.2)
ALT: 12 U/L (ref 0–35)
AST: 11 U/L (ref 0–37)
Alkaline Phosphatase: 67 U/L (ref 39–117)
BILIRUBIN TOTAL: 0.5 mg/dL (ref 0.2–1.2)
BUN: 12 mg/dL (ref 6–23)
CHLORIDE: 105 meq/L (ref 96–112)
CO2: 27 mEq/L (ref 19–32)
CREATININE: 0.64 mg/dL (ref 0.40–1.20)
Calcium: 9.5 mg/dL (ref 8.4–10.5)
GFR: 120.82 mL/min (ref >60.00)
Glucose, Bld: 97 mg/dL (ref 70–99)
Potassium: 3.7 mEq/L (ref 3.5–5.1)
SODIUM: 138 meq/L (ref 135–145)
TOTAL PROTEIN: 7.2 g/dL (ref 6.0–8.3)

## 2017-11-07 LAB — SEDIMENTATION RATE: Sed Rate: 30 mm/hr (ref 0–30)

## 2017-11-07 LAB — IBC PANEL
Iron: 63 ug/dL (ref 42–145)
Saturation Ratios: 13.6 % — ABNORMAL LOW (ref 20.0–50.0)
Transferrin: 332 mg/dL (ref 212.0–360.0)

## 2017-11-07 LAB — TSH: TSH: 2.1 u[IU]/mL (ref 0.35–4.50)

## 2017-11-07 MED ORDER — GABAPENTIN 100 MG PO CAPS: 200.0000 mg | ORAL_CAPSULE | Freq: Every day | ORAL | 3 refills | Status: DC

## 2017-11-07 NOTE — Progress Notes (Signed)
[name] [place], [place] [address] Phone: [phone] Subjective:      CC: Knee pain.  FMB:[id number]  Dawn [name] is a 62 y.o. female coming in with complaint of knee pain.  Has mild to moderate osteoarthritic changes of the knees bilaterally.  Also had an acute meniscal tear back in 2017.  Patient did have a right knee arthroscopic done in the same year.  Patient states that the right knee is worse than the left as she is in constant, dull pain. She has foot pain on the right side as well. The more she moves the better her knee feels.  Onset- chronic Location- bilateral knees worse on the right Duration- intermittent Character- dull Aggravating factors-sit to stand Reliving factors-  Therapies tried- injections Severity- 7/10  Also having increasing back pain.  Unable to lay down flat at night.  Rates the severity of pain is 8 out of 10.  Patient states that she has to get up and moved to the chair.  When she sleeps in the chair than she has unfortunately neck pain.  States that there might be some mild radiation down the right     Past Medical History:  Diagnosis Date  . Abnormal glucose   . Allergic rhinitis   . Anxiety   . Arthritis   . Carpal tunnel syndrome    right  . Cervical dysplasia    1996  . Endometriosis   . Fibroids   . Hemorrhoids   . High cholesterol   . Hyperlipemia   . Hypertension   . Migraine   . Neck pain   . Stroke [facility]) 2014   TIA  . Transient cerebral ischemia    10-22-12 ED, tia s/s but negative work up in ED. see note    Past Surgical History:  Procedure Laterality Date  . ABDOMINAL HYSTERECTOMY  Partial  . CARPAL TUNNEL RELEASE Right   . cold knife cone     for dysplasia  . COLONOSCOPY     last 11-2008 w/[facility], hems only   . KNEE ARTHROSCOPY WITH MEDIAL MENISECTOMY Right 03/28/2016   Procedure: RIGHT KNEE ARTHROSCOPY CHONDROPLASTY WITH MEDIAL MENISCECTOMY;  Surgeon: [name], MD;  Location: [facility];  Service: Orthopedics;  Laterality: Right;  . TRIGGER FINGER RELEASE     thumb, middle finger   . TUBAL LIGATION     Social History   Socioeconomic History  . Marital status: Married    Spouse name: Not on file  . Number of children: 1  . Years of education: Not on file  . Highest education level: Not on file  Social Needs  . Financial resource strain: Not on file  . Food insecurity - worry: Not on file  . Food insecurity - inability: Not on file  . Transportation needs - medical: Not on file  . Transportation needs - non-medical: Not on file  Occupational History  . Occupation: Retired  Tobacco Use  . Smoking status: Never Smoker  . Smokeless tobacco: Never Used  Substance and Sexual Activity  . Alcohol use: Yes    Alcohol/week: 0.6 oz    Types: 1 Glasses of wine per week    Comment: occasionally  . Drug use: No  . Sexual activity: No  Other Topics Concern  . Not on file  Social History Narrative  . Not on file   Allergies  Allergen Reactions  . Sulfa Antibiotics Rash  Family History  Problem Relation Age of Onset  . Colon cancer Father   . Stomach cancer Father   . Diabetes Mother   . Hypertension Mother   . CVA Mother   . Kidney disease Mother   . Aneurysm Mother   . Colon cancer Maternal Uncle   . Breast cancer Maternal Aunt   . Kidney disease Maternal Aunt   . Heart disease Brother   . Throat cancer Maternal Uncle   . Rectal cancer Neg Hx      Past medical history, social, surgical and family history all reviewed in electronic medical record.  No pertanent information unless stated regarding to the chief complaint.   Review of Systems:Review of systems updated and as accurate as of 11/07/17  No headache, visual changes, nausea, vomiting, diarrhea, constipation, dizziness, abdominal pain, skin rash, fevers, chills, night sweats, weight loss, swollen lymph nodes, body aches, joint swelling,, chest pain,  shortness of breath, mood changes.  Positive muscle aches  Objective  Blood pressure 122/80, pulse 92, height 5\' 2"  (1.575 m), weight 181 lb (82.1 kg), SpO2 98 %. Systems examined below as of 11/07/17   General: No apparent distress alert and oriented x3 mood and affect normal, dressed appropriately.  HEENT: Pupils equal, extraocular movements intact  Respiratory: Patient's speak in full sentences and does not appear short of breath  Cardiovascular: No lower extremity edema, non tender, no erythema  Skin: Warm dry intact with no signs of infection or rash on extremities or on axial skeleton.  Abdomen: Soft nontender  Neuro: Cranial nerves II through XII are intact, neurovascularly intact in all extremities with 2+ DTRs and 2+ pulses.  Lymph: No lymphadenopathy of posterior or anterior cervical chain or axillae bilaterally.  Gait antalgic gait MSK: Mild to moderate tender with full range of motion and good stability and symmetric strength and tone of shoulders, elbows, wrist, hip and ankles bilaterally.   Knee: Right valgus deformity noted. Abnormal thigh to calf ratio.  Tender to palpation over medial and PF joint line.  ROM full in flexion and extension and lower leg rotation. painful patellar compression. Patellar glide with moderate crepitus. Patellar and quadriceps tendons unremarkable. Hamstring and quadriceps strength is normal. Contralateral knee shows shows mild arthritic changes  Back Exam:  Inspection: Loss of lordosis Motion: Flexion 35 deg, Extension 25 deg worsening symptoms, Side Bending to 35 deg bilaterally,  Rotation to 35 deg bilaterally  SLR laying: Negative  XSLR laying: Negative  Palpable tenderness: Tender to palpation the paraspinal musculature lumbar spine right greater than left.Marland Kitchen FABER: Significant tightness bilaterally. Sensory change: Gross sensation intact to all lumbar and sacral dermatomes.  Reflexes: 2+ at both patellar tendons, 2+ at achilles  tendons, Babinski's downgoing.  Strength at foot  4+ out of 5 but symmetric    After informed written and verbal consent, patient was seated on exam table. Right knee was prepped with alcohol swab and utilizing anterolateral approach, patient's right knee space was injected with 4:1  marcaine 0.5%: Kenalog 40mg /dL. Patient tolerated the procedure well without immediate complications.    Impression and Recommendations:     This case required medical decision making of moderate complexity.      Note: This dictation was prepared with Dragon dictation along with smaller phrase technology. Any transcriptional errors that result from this process are unintentional.

## 2017-11-07 NOTE — Assessment & Plan Note (Signed)
Injection given today.  Tolerated the procedure well.  Discussed icing regimen and home exercise.  Could be a candidate for Visco supplementation if needed.  Patient would wants to avoid that.  Has responded well with usually one year of improvement.  Follow-up again 4 weeks

## 2017-11-07 NOTE — Patient Instructions (Signed)
Good to see you  Injected the knee today  Lets get xrays of the low back and lets get labs today to make sure we are not missing anything  Stay active though Ice is your friend I will write you when I get the results Gabapenitn 200mg  at night See me again in 4 weeks

## 2017-11-07 NOTE — Assessment & Plan Note (Signed)
Decision today to treat with OMT was based on Physical Exam  After verbal consent patient was treated with HVLA, ME, FPR techniques in cervical, thoracic, rib lumbar and sacral areas  Patient tolerated the procedure well with improvement in symptoms  Patient given exercises, stretches and lifestyle modifications  See medications in patient instructions if given  Patient will follow up in 4 weeks 

## 2017-11-07 NOTE — Assessment & Plan Note (Signed)
More low back pain.  Seems worse than usual.  Patient states that it is severe.  Mild potential radiation down the legs.  Patient's history seems to be somewhat consistent with mild spinal stenosis.  X-rays ordered today and started on gabapentin.  Follow-up 4 weeks

## 2017-11-11 LAB — ANGIOTENSIN CONVERTING ENZYME: ANGIOTENSIN-CONVERTING ENZYME: 87 U/L — AB (ref 9–67)

## 2017-11-11 LAB — VITAMIN D 1,25 DIHYDROXY
VITAMIN D 1, 25 (OH) TOTAL: 58 pg/mL (ref 18–72)
VITAMIN D3 1, 25 (OH): 58 pg/mL
Vitamin D2 1, 25 (OH)2: <8 pg/mL

## 2017-11-11 LAB — PTH, INTACT AND CALCIUM
Calcium: 9.7 mg/dL (ref 8.6–10.4)
PTH: 37 pg/mL (ref 14–64)

## 2017-11-11 LAB — ANTI-NUCLEAR AB-TITER (ANA TITER)

## 2017-11-11 LAB — ANA: Anti Nuclear Antibody(ANA): POSITIVE — AB

## 2017-11-11 LAB — CYCLIC CITRUL PEPTIDE ANTIBODY, IGG

## 2017-11-11 LAB — RHEUMATOID FACTOR

## 2017-12-01 DIAGNOSIS — Z1159 Encounter for screening for other viral diseases: Secondary | ICD-10-CM | POA: Diagnosis not present

## 2017-12-01 DIAGNOSIS — I10 Essential (primary) hypertension: Secondary | ICD-10-CM | POA: Diagnosis not present

## 2017-12-01 DIAGNOSIS — E7800 Pure hypercholesterolemia, unspecified: Secondary | ICD-10-CM | POA: Diagnosis not present

## 2017-12-01 DIAGNOSIS — R7303 Prediabetes: Secondary | ICD-10-CM | POA: Diagnosis not present

## 2017-12-05 ENCOUNTER — Ambulatory Visit: Payer: Federal, State, Local not specified - PPO | Admitting: Family Medicine

## 2017-12-16 DIAGNOSIS — Z01419 Encounter for gynecological examination (general) (routine) without abnormal findings: Secondary | ICD-10-CM | POA: Diagnosis not present

## 2017-12-16 DIAGNOSIS — Z1231 Encounter for screening mammogram for malignant neoplasm of breast: Secondary | ICD-10-CM | POA: Diagnosis not present

## 2017-12-16 DIAGNOSIS — Z6833 Body mass index (BMI) 33.0-33.9, adult: Secondary | ICD-10-CM | POA: Diagnosis not present

## 2018-01-29 DIAGNOSIS — Z809 Family history of malignant neoplasm, unspecified: Secondary | ICD-10-CM | POA: Diagnosis not present

## 2018-03-06 DIAGNOSIS — M1711 Unilateral primary osteoarthritis, right knee: Secondary | ICD-10-CM | POA: Diagnosis not present

## 2018-03-16 ENCOUNTER — Ambulatory Visit: Payer: Federal, State, Local not specified - PPO | Admitting: Family Medicine

## 2018-03-23 ENCOUNTER — Encounter: Payer: Self-pay | Admitting: Family Medicine

## 2018-03-25 NOTE — Progress Notes (Signed)
[name] [address] [place], [place] [address] Phone: [phone] Subjective:    I'm seeing this patient by the request  of:    CC: Bilateral knee pain  GYI:[id number]  Dawn [name] is a 62 y.o. female coming in with complaint of bilateral knee pain.  Has been seen previously.  Has had severe arthritic changes of the knees.  Patient states that increasing discomfort and pain again.  Increasing instability.  Rates the severity pain is 9 out of 10.  Patient states there is been some swelling.     Past Medical History:  Diagnosis Date  . Abnormal glucose   . Allergic rhinitis   . Anxiety   . Arthritis   . Carpal tunnel syndrome    right  . Cervical dysplasia    1996  . Endometriosis   . Fibroids   . Hemorrhoids   . High cholesterol   . Hyperlipemia   . Hypertension   . Migraine   . Neck pain   . Stroke [facility]) 2014   TIA  . Transient cerebral ischemia    10-22-12 ED, tia s/s but negative work up in ED. see note    Past Surgical History:  Procedure Laterality Date  . ABDOMINAL HYSTERECTOMY  Partial  . CARPAL TUNNEL RELEASE Right   . cold knife cone     for dysplasia  . COLONOSCOPY     last 11-2008 w/[facility], hems only   . KNEE ARTHROSCOPY WITH MEDIAL MENISECTOMY Right 03/28/2016   Procedure: RIGHT KNEE ARTHROSCOPY CHONDROPLASTY WITH MEDIAL MENISCECTOMY;  Surgeon: [name], MD;  Location: [place];  Service: Orthopedics;  Laterality: Right;  . TRIGGER FINGER RELEASE     thumb, middle finger   . TUBAL LIGATION     Social History   Socioeconomic History  . Marital status: Married    Spouse name: Not on file  . Number of children: 1  . Years of education: Not on file  . Highest education level: Not on file  Occupational History  . Occupation: Retired  Scientific laboratory technician  . Financial resource strain: Not on file  . Food insecurity:    Worry: Not on file    Inability: Not on file  . Transportation needs:   Medical: Not on file    Non-medical: Not on file  Tobacco Use  . Smoking status: Never Smoker  . Smokeless tobacco: Never Used  Substance and Sexual Activity  . Alcohol use: Yes    Alcohol/week: 0.6 oz    Types: 1 Glasses of wine per week    Comment: occasionally  . Drug use: No  . Sexual activity: Never  Lifestyle  . Physical activity:    Days per week: Not on file    Minutes per session: Not on file  . Stress: Not on file  Relationships  . Social connections:    Talks on phone: Not on file    Gets together: Not on file    Attends religious service: Not on file    Active member of club or organization: Not on file    Attends meetings of clubs or organizations: Not on file    Relationship status: Not on file  Other Topics Concern  . Not on file  Social History Narrative  . Not on file   Allergies  Allergen Reactions  . Sulfa Antibiotics Rash   Family History  Problem Relation Age of Onset  . Colon cancer Father   .  Stomach cancer Father   . Diabetes Mother   . Hypertension Mother   . CVA Mother   . Kidney disease Mother   . Aneurysm Mother   . Colon cancer Maternal Uncle   . Breast cancer Maternal Aunt   . Kidney disease Maternal Aunt   . Heart disease Brother   . Throat cancer Maternal Uncle   . Rectal cancer Neg Hx      Past medical history, social, surgical and family history all reviewed in electronic medical record.  No pertanent information unless stated regarding to the chief complaint.   Review of Systems:Review of systems updated and as accurate as of 03/26/18  No headache, visual changes, nausea, vomiting, diarrhea, constipation, dizziness, abdominal pain, skin rash, fevers, chills, night sweats, weight loss, swollen lymph nodes, body aches, joint swelling, muscle aches, chest pain, shortness of breath, mood changes.   Objective  Blood pressure 110/84, pulse 75, height 5\' 2"  (1.575 m), weight 182 lb (82.6 kg), SpO2 96 %. Systems examined below as  of 03/26/18   General: No apparent distress alert and oriented x3 mood and affect normal, dressed appropriately.  HEENT: Pupils equal, extraocular movements intact  Respiratory: Patient's speak in full sentences and does not appear short of breath  Cardiovascular: No lower extremity edema, non tender, no erythema  Skin: Warm dry intact with no signs of infection or rash on extremities or on axial skeleton.  Abdomen: Soft nontender  Neuro: Cranial nerves II through XII are intact, neurovascularly intact in all extremities with 2+ DTRs and 2+ pulses.  Lymph: No lymphadenopathy of posterior or anterior cervical chain or axillae bilaterally.  Gait antalgic MSK:  Non tender with full range of motion and good stability and symmetric strength and tone of shoulders, elbows, wrist, hip, and ankles bilaterally.  Mild arthritic changes of multiple joints Knee: Bilateral valgus deformity noted. Large thigh to calf ratio.  Tender to palpation over medial and PF joint line.  ROM full in flexion and extension and lower leg rotation. instability with valgus force.  painful patellar compression. Patellar glide with moderate crepitus. Patellar and quadriceps tendons unremarkable. Hamstring and quadriceps strength is normal.   After informed written and verbal consent, patient was seated on exam table. Right knee was prepped with alcohol swab and utilizing anterolateral approach, patient's right knee space was injected with 4:1  marcaine 0.5%: Kenalog 40mg /dL. Patient tolerated the procedure well without immediate complications.  After informed written and verbal consent, patient was seated on exam table. Left knee was prepped with alcohol swab and utilizing anterolateral approach, patient's left knee space was injected with 4:1  marcaine 0.5%: Kenalog 40mg /dL. Patient tolerated the procedure well without immediate complications.   Impression and Recommendations:     This case required medical decision  making of moderate complexity.      Note: This dictation was prepared with Dragon dictation along with smaller phrase technology. Any transcriptional errors that result from this process are unintentional.

## 2018-03-26 ENCOUNTER — Encounter: Payer: Self-pay | Admitting: Family Medicine

## 2018-03-26 ENCOUNTER — Ambulatory Visit (INDEPENDENT_AMBULATORY_CARE_PROVIDER_SITE_OTHER): Payer: Federal, State, Local not specified - PPO | Admitting: Family Medicine

## 2018-03-26 DIAGNOSIS — M170 Bilateral primary osteoarthritis of knee: Secondary | ICD-10-CM

## 2018-03-26 NOTE — Assessment & Plan Note (Signed)
Bilateral injections given.  Discussed icing regimen and home exercise.  Discussed which activities to do which wants to avoid.  Patient is to increase activity slowly over the course the next several days.  Can do Visco supplementation if needed.  Follow-up again otherwise in 3 to 4 months.

## 2018-03-26 NOTE — Patient Instructions (Signed)
Good to see you  You know the drill  Ice is your friend Stay active See me again in 3 months or so

## 2018-05-22 IMAGING — DX DG KNEE COMPLETE 4+V*R*
4 series · 4 of 4 positions shown · non-contrast
Comparison: No prior.

CLINICAL DATA: Pain.  Meniscus tear.  No known injury.

EXAM:
RIGHT KNEE - COMPLETE 4+ VIEW

[knee ap]
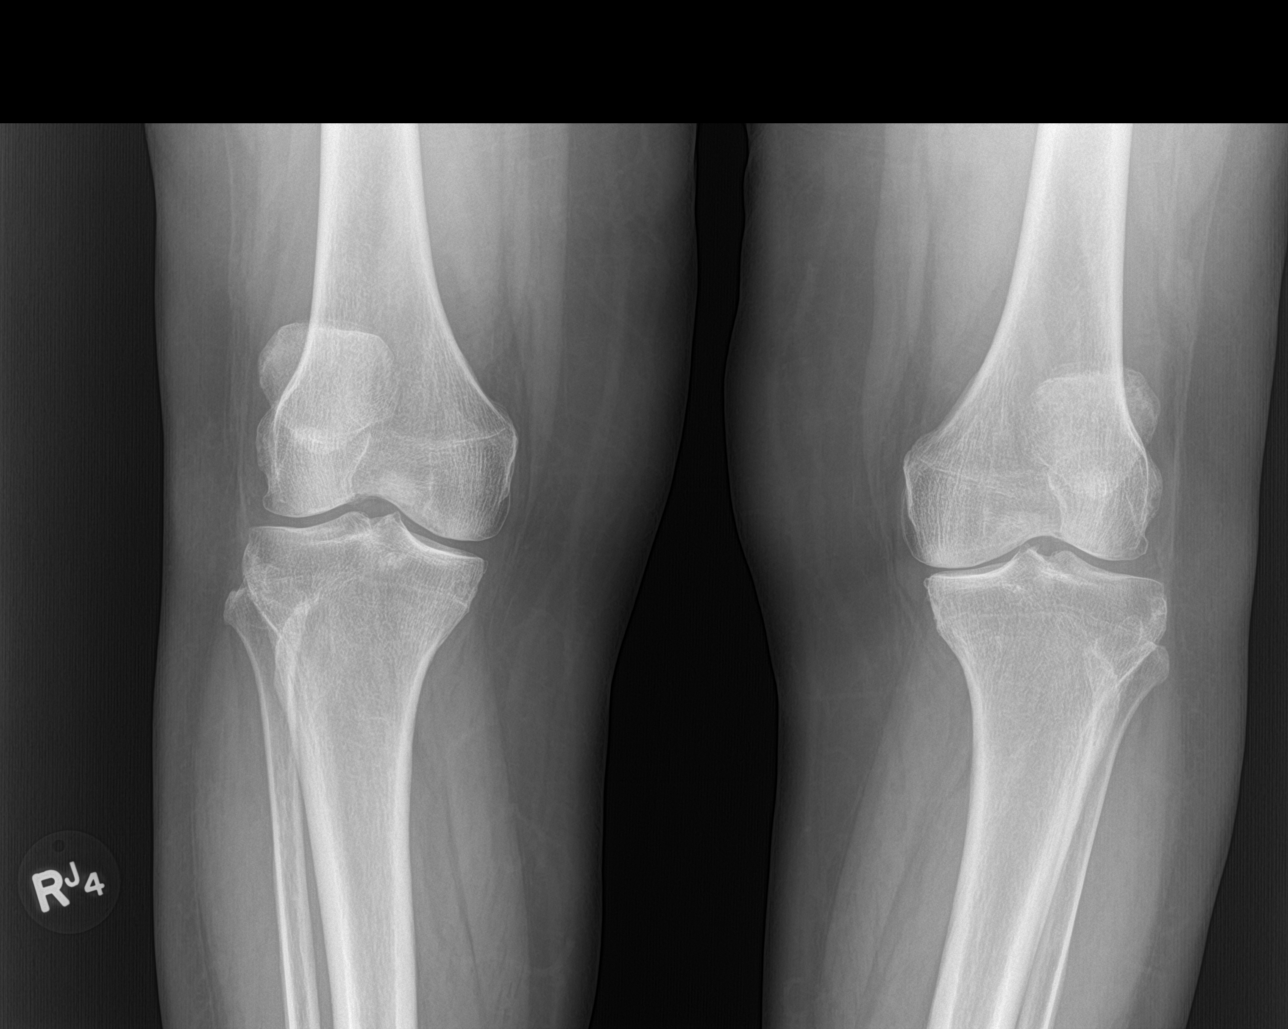

[knee tunnel]
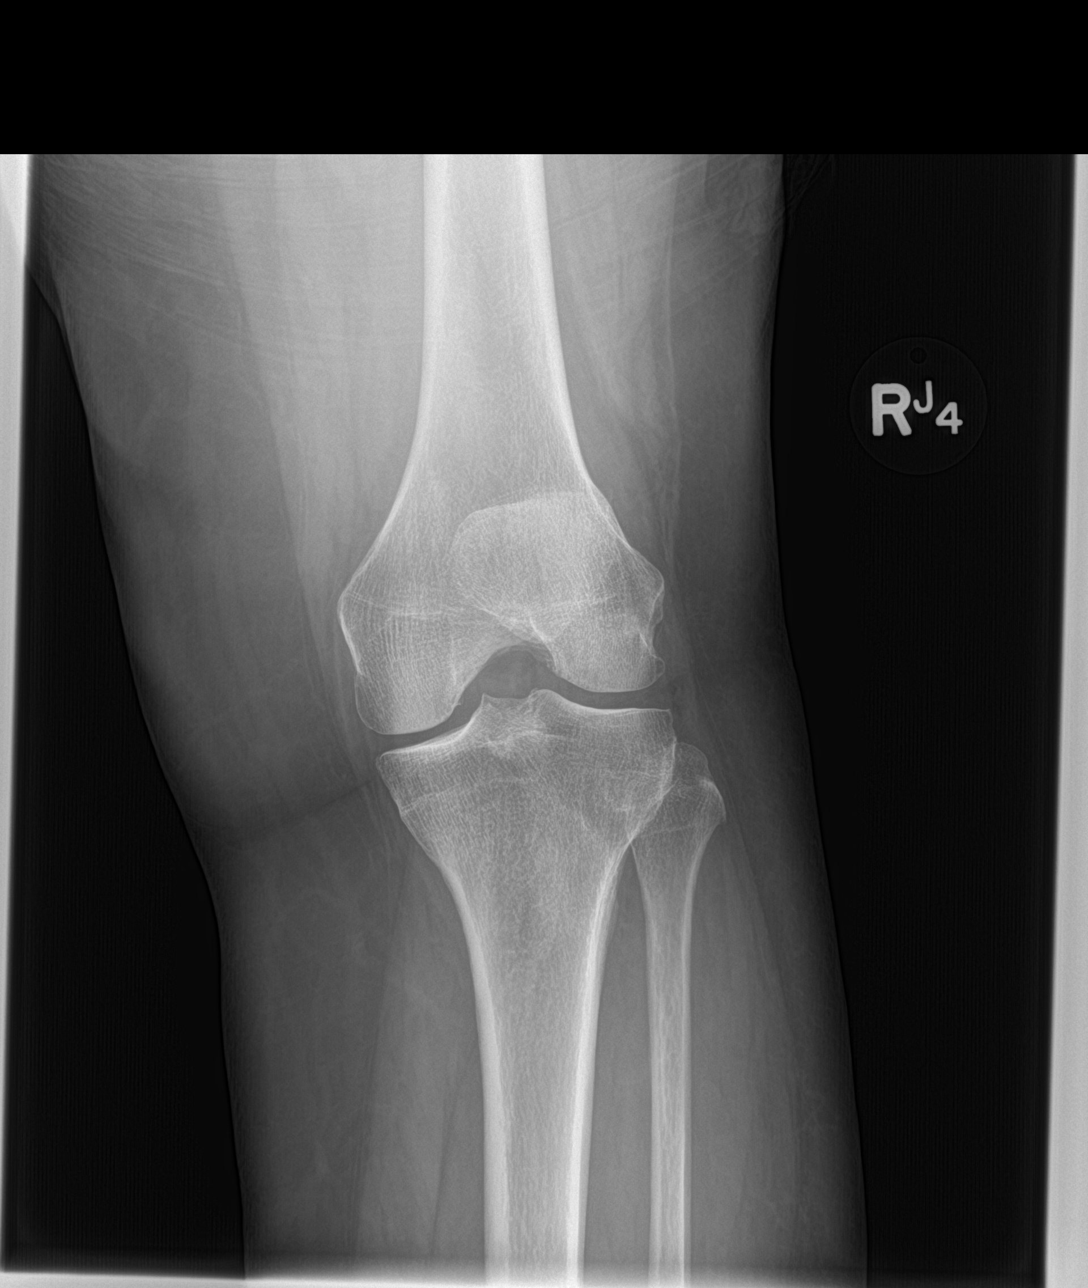

[knee lat]
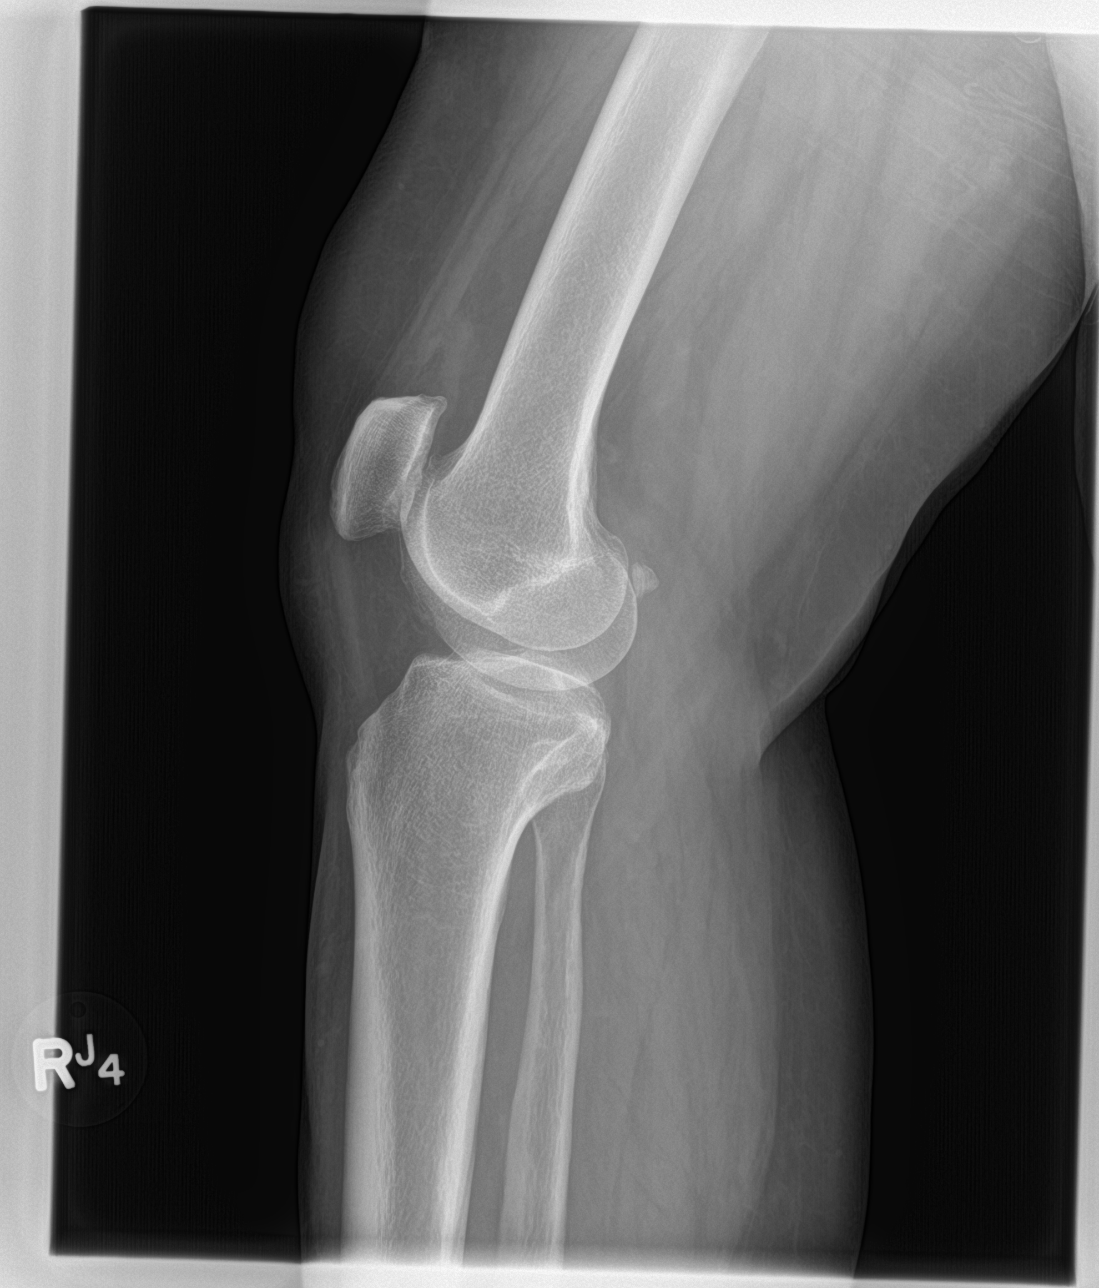

[sunrise]
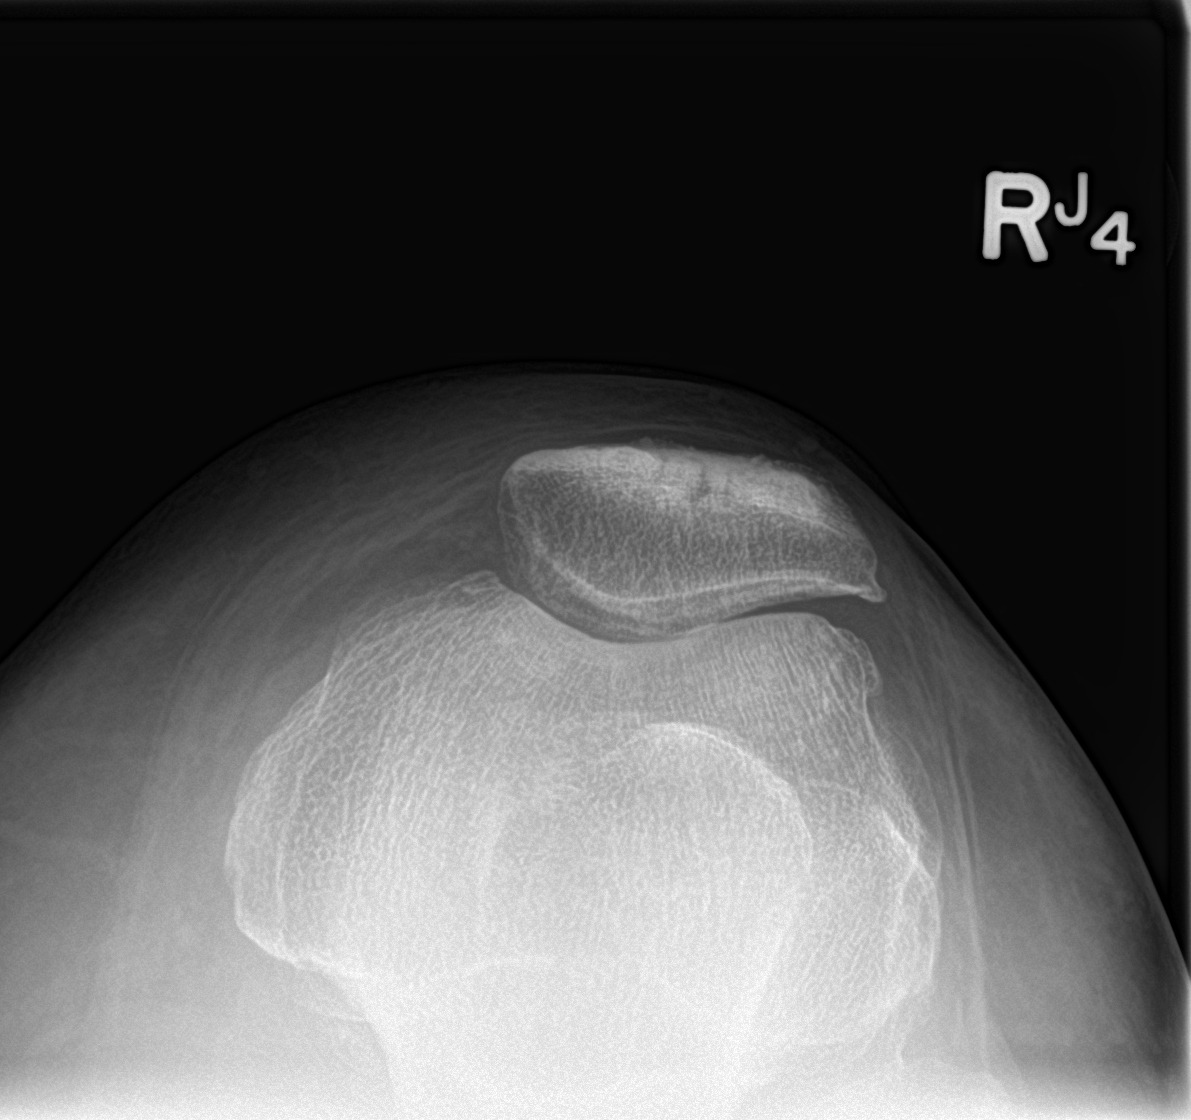

[4 of 4 positions shown; findings below may reference images not displayed]

FINDINGS: No acute bony or joint abnormality identified. Medial compartment
patellofemoral mild degenerative change. Small right knee joint
effusion cannot be excluded.
IMPRESSION: 1. Medial compartment and patellofemoral mild degenerative change.
Small knee joint effusion cannot be excluded.

2. No acute bony abnormality.

## 2018-06-09 DIAGNOSIS — K080 Exfoliation of teeth due to systemic causes: Secondary | ICD-10-CM | POA: Diagnosis not present

## 2018-07-01 ENCOUNTER — Ambulatory Visit: Payer: Federal, State, Local not specified - PPO | Admitting: Family Medicine

## 2018-07-14 NOTE — Progress Notes (Signed)
[name] [name], [place] [address] Phone: [phone] Subjective:   [name], am serving as a scribe for Dr. Hulan Saas.  I'm seeing this patient by the request  of:    CC: Bilateral knee pain, neck pain  PIR:[id number]  Dawn [name] is a 62 y.o. female coming in with complaint of bilateral knee pain. Right knee is doing ok but her right heel is bothering her. She does do plantar fasciitis exercises. Her left knee is worse than the right.   She does have right cervical spine pain and she is having a headache today. Pain in her neck started 2 days ago.  Had responded fairly well to manipulation previously.  Worsening pain again.  Feels like it is at the base of her neck.  Causing headaches.  No radiation down the arms or any numbness or tingling.      Past Medical History:  Diagnosis Date  . Abnormal glucose   . Allergic rhinitis   . Anxiety   . Arthritis   . Carpal tunnel syndrome    right  . Cervical dysplasia    1996  . Endometriosis   . Fibroids   . Hemorrhoids   . High cholesterol   . Hyperlipemia   . Hypertension   . Migraine   . Neck pain   . Stroke [facility]) 2014   TIA  . Transient cerebral ischemia    10-22-12 ED, tia s/s but negative work up in ED. see note    Past Surgical History:  Procedure Laterality Date  . ABDOMINAL HYSTERECTOMY  Partial  . CARPAL TUNNEL RELEASE Right   . cold knife cone     for dysplasia  . COLONOSCOPY     last 11-2008 w/patterson, hems only   . KNEE ARTHROSCOPY WITH MEDIAL MENISECTOMY Right 03/28/2016   Procedure: RIGHT KNEE ARTHROSCOPY CHONDROPLASTY WITH MEDIAL MENISCECTOMY;  Surgeon: [name], MD;  Location: [place];  Service: Orthopedics;  Laterality: Right;  . TRIGGER FINGER RELEASE     thumb, middle finger   . TUBAL LIGATION     Social History   Socioeconomic History  . Marital status: Married    Spouse name: Not on file  . Number of  children: 1  . Years of education: Not on file  . Highest education level: Not on file  Occupational History  . Occupation: Retired  Scientific laboratory technician  . Financial resource strain: Not on file  . Food insecurity:    Worry: Not on file    Inability: Not on file  . Transportation needs:    Medical: Not on file    Non-medical: Not on file  Tobacco Use  . Smoking status: Never Smoker  . Smokeless tobacco: Never Used  Substance and Sexual Activity  . Alcohol use: Yes    Alcohol/week: 1.0 standard drinks    Types: 1 Glasses of wine per week    Comment: occasionally  . Drug use: No  . Sexual activity: Never  Lifestyle  . Physical activity:    Days per week: Not on file    Minutes per session: Not on file  . Stress: Not on file  Relationships  . Social connections:    Talks on phone: Not on file    Gets together: Not on file    Attends religious service: Not on file    Active member of club or organization: Not on file    Attends  meetings of clubs or organizations: Not on file    Relationship status: Not on file  Other Topics Concern  . Not on file  Social History Narrative  . Not on file   Allergies  Allergen Reactions  . Sulfa Antibiotics Rash   Family History  Problem Relation Age of Onset  . Colon cancer Father   . Stomach cancer Father   . Diabetes Mother   . Hypertension Mother   . CVA Mother   . Kidney disease Mother   . Aneurysm Mother   . Colon cancer Maternal Uncle   . Breast cancer Maternal Aunt   . Kidney disease Maternal Aunt   . Heart disease Brother   . Throat cancer Maternal Uncle   . Rectal cancer Neg Hx      Current Outpatient Medications (Cardiovascular):  .  rosuvastatin (CRESTOR) 5 MG tablet, Take 5 mg by mouth daily. Marland Kitchen  triamterene-hydrochlorothiazide (MAXZIDE-25) 37.5-25 MG per tablet, Take 1 tablet by mouth daily.   Current Outpatient Medications (Analgesics):  .  aspirin 81 MG chewable tablet, Chew 81 mg by mouth daily. .  naproxen  sodium (ANAPROX) 220 MG tablet, Take 220 mg by mouth 2 (two) times daily with a meal.   Current Outpatient Medications (Other):  .  cholecalciferol (VITAMIN D) 1000 UNITS tablet, Take 2,000 Units by mouth daily. .  cyclobenzaprine (FLEXERIL) 10 MG tablet, Take 10 mg by mouth 3 (three) times daily as needed. For headache .  Diclofenac Sodium (PENNSAID) 2 % SOLN, Place onto the skin. .  Multiple Vitamin (MULTIVITAMIN WITH MINERALS) TABS, Take 1 tablet by mouth daily.    Past medical history, social, surgical and family history all reviewed in electronic medical record.  No pertanent information unless stated regarding to the chief complaint.   Review of Systems:  No headache, visual changes, nausea, vomiting, diarrhea, constipation, dizziness, abdominal pain, skin rash, fevers, chills, night sweats, weight loss, swollen lymph nodes, body aches, joint swelling,  chest pain, shortness of breath, mood changes.  Positive muscle aches  Objective  Blood pressure 118/82, pulse 87, height 5\' 2"  (1.575 m), weight 182 lb (82.6 kg), SpO2 98 %.   General: No apparent distress alert and oriented x3 mood and affect normal, dressed appropriately.  HEENT: Pupils equal, extraocular movements intact  Respiratory: Patient's speak in full sentences and does not appear short of breath  Cardiovascular: No lower extremity edema, non tender, no erythema  Skin: Warm dry intact with no signs of infection or rash on extremities or on axial skeleton.  Abdomen: Soft nontender  Neuro: Cranial nerves II through XII are intact, neurovascularly intact in all extremities with 2+ DTRs and 2+ pulses.  Lymph: No lymphadenopathy of posterior or anterior cervical chain or axillae bilaterally.  Gait normal with good balance and coordination.  MSK:  Non tender with full range of motion and good stability and symmetric strength and tone of shoulders, elbows, wrist, hip, and ankles bilaterally.  Knee: Bilateral valgus deformity  noted.  Abnormal thigh to calf ratio.  Tender to palpation over medial and PF joint line.  ROM full in flexion and extension and lower leg rotation. instability with valgus force.  Very mild painful patellar compression. Patellar glide with moderate crepitus. Patellar and quadriceps tendons unremarkable. Hamstring and quadriceps strength is normal.  Neck loss of lordosis  Neg spurlings  Near full ROM.  NVI distally with symmetric strength in UE  Osteopathic findings C2 flexed rotated and side bent right C4 flexed rotated  and side bent left C6 flexed rotated and side bent left T3 extended rotated and side bent right inhaled third rib T9 extended rotated and side bent left L2 flexed rotated and side bent right Sacrum right on right    Back Exam:  Inspection: Unremarkable  Motion: Flexion 45 deg, Extension 25 deg, Side Bending to 25 deg bilaterally,  Rotation to 40 deg bilaterally  SLR laying: Negative  XSLR laying: Negative  Palpable tenderness: Tender to palpation the paraspinal musculature lumbar spine right greater than left. FABER: negative. Sensory change: Gross sensation intact to all lumbar and sacral dermatomes.  Reflexes: 2+ at both patellar tendons, 2+ at achilles tendons, Babinski's downgoing.  Strength at foot  Plantar-flexion: 5/5 Dorsi-flexion: 5/5 Eversion: 5/5 Inversion: 5/5  Leg strength  Quad: 5/5 Hamstring: 5/5 Hip flexor: 5/5 Hip abductors: 5/5  Gait unremarkable.    Impression and Recommendations:     This case required medical decision making of moderate complexity. The above documentation has been reviewed and is accurate and complete Lyndal Pulley, DO       Note: This dictation was prepared with Dragon dictation along with smaller phrase technology. Any transcriptional errors that result from this process are unintentional.

## 2018-07-15 ENCOUNTER — Ambulatory Visit (INDEPENDENT_AMBULATORY_CARE_PROVIDER_SITE_OTHER): Payer: Federal, State, Local not specified - PPO | Admitting: Family Medicine

## 2018-07-15 ENCOUNTER — Encounter: Payer: Self-pay | Admitting: Family Medicine

## 2018-07-15 VITALS — BP 118/82 | HR 87 | Ht 62.0 in | Wt 182.0 lb

## 2018-07-15 DIAGNOSIS — M9983 Other biomechanical lesions of lumbar region: Secondary | ICD-10-CM | POA: Diagnosis not present

## 2018-07-15 DIAGNOSIS — M9988 Other biomechanical lesions of rib cage: Secondary | ICD-10-CM

## 2018-07-15 DIAGNOSIS — M170 Bilateral primary osteoarthritis of knee: Secondary | ICD-10-CM | POA: Diagnosis not present

## 2018-07-15 DIAGNOSIS — M999 Biomechanical lesion, unspecified: Secondary | ICD-10-CM

## 2018-07-15 DIAGNOSIS — M9982 Other biomechanical lesions of thoracic region: Secondary | ICD-10-CM | POA: Diagnosis not present

## 2018-07-15 DIAGNOSIS — M9981 Other biomechanical lesions of cervical region: Secondary | ICD-10-CM | POA: Diagnosis not present

## 2018-07-15 DIAGNOSIS — M9984 Other biomechanical lesions of sacral region: Secondary | ICD-10-CM

## 2018-07-15 NOTE — Patient Instructions (Addendum)
Great to see you  Dawn [name] is your friend pennsaid pinkie amount topically 2 times daily as needed.   Arnica lotion to the bruise will help  For the neck  On wall with heels, butt shoulder and head touching for a goal of 5 minutes daily  See me again in 4 weeks and we will recheck the knees

## 2018-07-15 NOTE — Assessment & Plan Note (Signed)
Decision today to treat with OMT was based on Physical Exam  After verbal consent patient was treated with HVLA techniques in cervical thoracic, rib lumbar and sacral  areas  Patient tolerated the procedure well with improvement in symptoms  Patient given exercises, stretches and lifestyle modifications  See medications in patient instructions if given  Patient will follow up in 5-6 weeks

## 2018-07-15 NOTE — Assessment & Plan Note (Signed)
Known arthritic changes of the knees bilaterally.  Has responded fairly well to injections.  Last ones were greater than 4 months ago.  Worsening pain again.  Patient has declined any other type of interventions at the moment.  Discussed icing regimen and home exercises.  Postsurgical changes noted

## 2018-07-30 ENCOUNTER — Encounter: Payer: Self-pay | Admitting: Emergency Medicine

## 2018-07-30 ENCOUNTER — Other Ambulatory Visit: Payer: Self-pay

## 2018-07-30 ENCOUNTER — Ambulatory Visit
Admission: EM | Admit: 2018-07-30 | Discharge: 2018-07-30 | Disposition: A | Discharge status: Home / Self Care | Payer: Federal, State, Local not specified - PPO | Attending: Family Medicine | Admitting: Family Medicine

## 2018-07-30 DIAGNOSIS — H6122 Impacted cerumen, left ear: Secondary | ICD-10-CM | POA: Diagnosis not present

## 2018-07-30 NOTE — ED Provider Notes (Signed)
[facility]    CSN: [id number] Arrival date & time: 07/30/18  0263  History   Chief Complaint Chief Complaint  Patient presents with  . Ear Fullness   HPI  62 year old female presents with ear fullness.  Started on Monday.  Reports ear fullness and "fogginess" of the frontal region.  Patient has tried some over-the-counter equivalent to Debrox without resolution.  No fever.  No chills.  Patient has also tried some Alka-Seltzer cold without any improvement.  No known exacerbating factors.  Mild in severity.  No other associated symptoms.  No other complaints.  PMH, Surgical Hx, Family Hx, Social History reviewed and updated as below.  Past Medical History:  Diagnosis Date  . Abnormal glucose   . Allergic rhinitis   . Anxiety   . Arthritis   . Carpal tunnel syndrome    right  . Cervical dysplasia    1996  . Endometriosis   . Fibroids   . Hemorrhoids   . High cholesterol   . Hyperlipemia   . Hypertension   . Migraine   . Neck pain   . Stroke [facility]) 2014   TIA  . Transient cerebral ischemia    10-22-12 ED, tia s/s but negative work up in ED. see note     Patient Active Problem List   Diagnosis Date Noted  . Lumbar pain 11/07/2017  . Plantar fasciitis, right 11/07/2016  . Degenerative arthritis of knee, bilateral 08/27/2016  . Nonallopathic lesion of lumbosacral region 08/27/2016  . Acute medial meniscus tear of right knee 01/22/2016  . Tear of medial meniscus of left knee 07/19/2015  . Nonallopathic lesion of thoracic region 06/28/2015  . Nonallopathic lesion-rib cage 06/28/2015  . Nonallopathic lesion of cervical region 06/28/2015  . Cervical spondylosis without myelopathy 05/31/2015  . Cervicogenic headache 05/31/2015  . Family history of cerebral aneurysm 05/31/2015    Past Surgical History:  Procedure Laterality Date  . ABDOMINAL HYSTERECTOMY  Partial  . CARPAL TUNNEL RELEASE Right   . cold knife cone     for dysplasia  . COLONOSCOPY     last 11-2008 w/patterson, hems only   . KNEE ARTHROSCOPY WITH MEDIAL MENISECTOMY Right 03/28/2016   Procedure: RIGHT KNEE ARTHROSCOPY CHONDROPLASTY WITH MEDIAL MENISCECTOMY;  Surgeon: [name], MD;  Location: [place];  Service: Orthopedics;  Laterality: Right;  . TRIGGER FINGER RELEASE     thumb, middle finger   . TUBAL LIGATION      OB History   None      Home Medications    Prior to Admission medications   Medication Sig Start Date End Date Taking? Authorizing Provider  aspirin 81 MG chewable tablet Chew 81 mg by mouth daily.   Yes [provider]  cholecalciferol (VITAMIN D) 1000 UNITS tablet Take 2,000 Units by mouth daily.   Yes [provider]  cyclobenzaprine (FLEXERIL) 10 MG tablet Take 10 mg by mouth 3 (three) times daily as needed. For headache   Yes [provider]  Diclofenac Sodium (PENNSAID) 2 % SOLN Place onto the skin.   Yes [provider]  Multiple Vitamin (MULTIVITAMIN WITH MINERALS) TABS Take 1 tablet by mouth daily.   Yes [provider]  naproxen sodium (ANAPROX) 220 MG tablet Take 220 mg by mouth 2 (two) times daily with a meal.   Yes [provider]  rosuvastatin (CRESTOR) 5 MG tablet Take 5 mg by mouth daily.   Yes [provider]  triamterene-hydrochlorothiazide Bradd Burner)  37.5-25 MG per tablet Take 1 tablet by mouth daily.   Yes [provider]    Family History Family History  Problem Relation Age of Onset  . Colon cancer Father   . Stomach cancer Father   . Diabetes Mother   . Hypertension Mother   . CVA Mother   . Kidney disease Mother   . Aneurysm Mother   . Colon cancer Maternal Uncle   . Breast cancer Maternal Aunt   . Kidney disease Maternal Aunt   . Heart disease Brother   . Throat cancer Maternal Uncle   . Rectal cancer Neg Hx    Social History Social History   Tobacco Use  . Smoking status: Never Smoker  . Smokeless tobacco: Never Used   Substance Use Topics  . Alcohol use: Yes    Alcohol/week: 1.0 standard drinks    Types: 1 Glasses of wine per week    Comment: occasionally  . Drug use: No   Allergies   Sulfa antibiotics  Review of Systems Review of Systems  Constitutional: Negative.   HENT:       Ears "stopped up"   Physical Exam Triage Vital Signs ED Triage Vitals [07/30/18 0929]  Enc Vitals Group     BP (!) 149/98     Pulse Rate 70     Resp 18     Temp 98.3 F (36.8 C)     Temp Source Oral     SpO2 100 %     Weight 178 lb (80.7 kg)     Height 5\' 2"  (1.575 m)     Head Circumference      Peak Flow      Pain Score 0     Pain Loc      Pain Edu?      Excl. in [place]?    Updated Vital Signs BP (!) 149/98 (BP Location: Left Arm)   Pulse 70   Temp 98.3 F (36.8 C) (Oral)   Resp 18   Ht 5\' 2"  (1.575 m)   Wt 80.7 kg   SpO2 100%   BMI 32.56 kg/m   Visual Acuity Right Eye Distance:   Left Eye Distance:   Bilateral Distance:    Right Eye Near:   Left Eye Near:    Bilateral Near:     Physical Exam  Constitutional: She is oriented to person, place, and time. She appears well-developed. No distress.  HENT:  Head: Normocephalic and atraumatic.  Left ear -cerumen impaction noted. Right ear -minimal amount of wax.  Normal TM.  Cardiovascular: Normal rate and regular rhythm.  Pulmonary/Chest: Effort normal and breath sounds normal. She has no wheezes. She has no rales.  Neurological: She is alert and oriented to person, place, and time.  Psychiatric: She has a normal mood and affect. Her behavior is normal.  Nursing note and vitals reviewed.  UC Treatments / Results  Labs (all labs ordered are listed, but only abnormal results are displayed) Labs Reviewed - No data to display  EKG None  Radiology No results found.  Procedures Procedures (including critical care time)  Medications Ordered in UC Medications - No data to display  Initial Impression / Assessment and Plan / UC Course  I  have reviewed the triage vital signs and the nursing notes.  Pertinent labs & imaging results that were available during my care of the patient were reviewed by me and considered in my medical decision making (see chart for details).  62 year old female presents with cerumen impaction.  Successful irrigation today.  Supportive care.  Final Clinical Impressions(s) / UC Diagnoses   Final diagnoses:  Impacted cerumen of left ear   Discharge Instructions   None    ED Prescriptions    None     Controlled Substance Prescriptions [place] Controlled Substance Registry consulted? Not Applicable   Coral Spikes, DO 07/30/18 1003

## 2018-07-30 NOTE — ED Triage Notes (Signed)
Patient c/o bilateral ear fullness that started on Monday. Denies pain. Patient tried OTC Alka seltzer cold with no relief.

## 2018-09-08 NOTE — Progress Notes (Signed)
[name] [place], [place] [address] Phone: [phone] Subjective:   Dawn [name], am serving as a scribe for Dr. Hulan Saas.  CC: Neck pain follow-up bilateral knee follow-up  HEN:[id number]  Dawn [name] is a 62 y.o. female coming in with complaint of cervical spine pain and knee pain. Does continue to have pain in the thoracic and cervical spine. [name] better.  Patient has been doing manipulation.  Does realize she get some relief for some time but then comes back to her baseline.  [name] radiation of the arms or any numbness or tingling.  Continues to have bilateral knee pain, left greater than right. Has tried Pennsaid and that did not help. Does use Aleve and does some of the exercises.  Known arthritic changes.  Has been sometime since we have done injections.  Greater than 6 months.  Started having increasing discomfort again.  Affecting daily activities and sometimes a throbbing sensation at night.  Mild instability from time to time      Past Medical History:  Diagnosis Date  . Abnormal glucose   . Allergic rhinitis   . Anxiety   . Arthritis   . Carpal tunnel syndrome    right  . Cervical dysplasia    1996  . Endometriosis   . Fibroids   . Hemorrhoids   . High cholesterol   . Hyperlipemia   . Hypertension   . Migraine   . Neck pain   . Stroke [facility]) 2014   TIA  . Transient cerebral ischemia    10-22-12 ED, tia s/s but negative work up in ED. see note    Past Surgical History:  Procedure Laterality Date  . ABDOMINAL HYSTERECTOMY  Partial  . CARPAL TUNNEL RELEASE Right   . cold knife cone     for dysplasia  . COLONOSCOPY     last 11-2008 w/patterson, hems only   . KNEE ARTHROSCOPY WITH MEDIAL MENISECTOMY Right 03/28/2016   Procedure: RIGHT KNEE ARTHROSCOPY CHONDROPLASTY WITH MEDIAL MENISCECTOMY;  Surgeon: [name], MD;  Location: [place];  Service: Orthopedics;  Laterality: Right;  . TRIGGER  FINGER RELEASE     thumb, middle finger   . TUBAL LIGATION     Social History   Socioeconomic History  . Marital status: Married    Spouse name: Not on file  . Number of children: 1  . Years of education: Not on file  . Highest education level: Not on file  Occupational History  . Occupation: Retired  Scientific laboratory technician  . Financial resource strain: Not on file  . Food insecurity:    Worry: Not on file    Inability: Not on file  . Transportation needs:    Medical: Not on file    Non-medical: Not on file  Tobacco Use  . Smoking status: Never Smoker  . Smokeless tobacco: Never Used  Substance and Sexual Activity  . Alcohol use: Yes    Alcohol/week: 1.0 standard drinks    Types: 1 Glasses of wine per week    Comment: occasionally  . Drug use: [name]  . Sexual activity: Never  Lifestyle  . Physical activity:    Days per week: Not on file    Minutes per session: Not on file  . Stress: Not on file  Relationships  . Social connections:    Talks on phone: Not on file    Gets together: Not on file  Attends religious service: Not on file    Active member of club or organization: Not on file    Attends meetings of clubs or organizations: Not on file    Relationship status: Not on file  Other Topics Concern  . Not on file  Social History Narrative  . Not on file   Allergies  Allergen Reactions  . Sulfa Antibiotics Rash   Family History  Problem Relation Age of Onset  . Colon cancer Father   . Stomach cancer Father   . Diabetes Mother   . Hypertension Mother   . CVA Mother   . Kidney disease Mother   . Aneurysm Mother   . Colon cancer Maternal Uncle   . Breast cancer Maternal Aunt   . Kidney disease Maternal Aunt   . Heart disease Brother   . Throat cancer Maternal Uncle   . Rectal cancer Neg Hx      Current Outpatient Medications (Cardiovascular):  .  rosuvastatin (CRESTOR) 5 MG tablet, Take 5 mg by mouth daily. Marland Kitchen  triamterene-hydrochlorothiazide (MAXZIDE-25)  37.5-25 MG per tablet, Take 1 tablet by mouth daily.   Current Outpatient Medications (Analgesics):  .  aspirin 81 MG chewable tablet, Chew 81 mg by mouth daily. .  naproxen sodium (ANAPROX) 220 MG tablet, Take 220 mg by mouth 2 (two) times daily with a meal.   Current Outpatient Medications (Other):  .  cholecalciferol (VITAMIN D) 1000 UNITS tablet, Take 2,000 Units by mouth daily. .  cyclobenzaprine (FLEXERIL) 10 MG tablet, Take 10 mg by mouth 3 (three) times daily as needed. For headache .  Diclofenac Sodium (PENNSAID) 2 % SOLN, Place onto the skin. .  Multiple Vitamin (MULTIVITAMIN WITH MINERALS) TABS, Take 1 tablet by mouth daily.    Past medical history, social, surgical and family history all reviewed in electronic medical record.  [name] pertanent information unless stated regarding to the chief complaint.   Review of Systems:  [name] headache, visual changes, nausea, vomiting, diarrhea, constipation, dizziness, abdominal pain, skin rash, fevers, chills, night sweats, weight loss, swollen lymph nodes, body aches, joint swelling,chest pain, shortness of breath, mood changes.  Positive muscle aches  Objective   Blood pressure 120/78, pulse 81, height 5\' 2"  (1.575 m), weight 183 lb (83 kg), SpO2 95 %.     General: [name] apparent distress alert and oriented x3 mood and affect normal, dressed appropriately.  HEENT: Pupils equal, extraocular movements intact  Respiratory: Patient's speak in full sentences and does not appear short of breath  Cardiovascular: Trace lower extremity edema, non tender, [name] erythema  Skin: Warm dry intact with [name] signs of infection or rash on extremities or on axial skeleton.  Abdomen: Soft nontender obese Neuro: Cranial nerves II through XII are intact, neurovascularly intact in all extremities with 2+ DTRs and 2+ pulses.  Lymph: [name] lymphadenopathy of posterior or anterior cervical chain or axillae bilaterally.  Gait mild antalgic MSK: Mild mild tender with full  range of motion and good stability and symmetric strength and tone of shoulders, elbows, wrist, hip, and ankles bilaterally.   Knee: Bilateral valgus deformity noted. Large thigh to calf ratio.  Tender to palpation over medial and PF joint line.  ROM full in flexion and extension and lower leg rotation. instability with valgus force.  painful patellar compression. Patellar glide with moderate crepitus. Patellar and quadriceps tendons unremarkable. Hamstring and quadriceps strength is normal.   Back exam shows loss of lordosis.  Tightness noted in the [place] test bilaterally.  Tightness of the straight leg test but [name] radicular symptoms.  Tender to palpation of the sacroiliac joints bilaterally.  Osteopathic findings C2 flexed rotated and side bent right C6 flexed rotated and side bent left T3 extended rotated and side bent right inhaled third rib T6 extended rotated and side bent left L3 flexed rotated and side bent right Sacrum right on right   After informed written and verbal consent, patient was seated on exam table. Right knee was prepped with alcohol swab and utilizing anterolateral approach, patient's right knee space was injected with 4:1  marcaine 0.5%: Kenalog 40mg /dL. Patient tolerated the procedure well without immediate complications.  After informed written and verbal consent, patient was seated on exam table. Left knee was prepped with alcohol swab and utilizing anterolateral approach, patient's left knee space was injected with 4:1  marcaine 0.5%: Kenalog 40mg /dL. Patient tolerated the procedure well without immediate complications.   Impression and Recommendations:     This case required medical decision making of moderate complexity. The above documentation has been reviewed and is accurate and complete Dawn Pulley, DO       Note: This dictation was prepared with Dragon dictation along with smaller phrase technology. Any transcriptional errors that result from  this process are unintentional.

## 2018-09-09 ENCOUNTER — Encounter: Payer: Self-pay | Admitting: Family Medicine

## 2018-09-09 ENCOUNTER — Ambulatory Visit (INDEPENDENT_AMBULATORY_CARE_PROVIDER_SITE_OTHER): Payer: Federal, State, Local not specified - PPO | Admitting: Family Medicine

## 2018-09-09 VITALS — BP 120/78 | HR 81 | Ht 62.0 in | Wt 183.0 lb

## 2018-09-09 DIAGNOSIS — M999 Biomechanical lesion, unspecified: Secondary | ICD-10-CM

## 2018-09-09 DIAGNOSIS — M545 Low back pain, unspecified: Secondary | ICD-10-CM

## 2018-09-09 DIAGNOSIS — M170 Bilateral primary osteoarthritis of knee: Secondary | ICD-10-CM

## 2018-09-09 NOTE — Patient Instructions (Signed)
Good to see you  Ice is your friend pennsaid pinkie amount topically 2 times daily as needed.   Stay active Injected both knees.  See me again in 4-6 weeks Happy holidays!

## 2018-09-09 NOTE — Assessment & Plan Note (Signed)
Decision today to treat with OMT was based on Physical Exam  After verbal consent patient was treated with HVLA, ME, FPR techniques in cervical, thoracic, rib lumbar and sacral areas  Patient tolerated the procedure well with improvement in symptoms  Patient given exercises, stretches and lifestyle modifications  See medications in patient instructions if given  Patient will follow up in 4-6 weeks 

## 2018-09-09 NOTE — Assessment & Plan Note (Signed)
Bilateral injections given today.  Discussed icing regimen and home exercises.  Discussed which activities to do which wants to avoid.  Discussed posture and ergonomics.  Discussed with potential instability that could be.  Patient could be candidate for Visco supplementation and will consider it.  Follow-up again in 4 to 6 weeks

## 2018-09-09 NOTE — Assessment & Plan Note (Signed)
Stable.  Does have some back pain.  Discussed posture and ergonomics.  Discussed which activities to do which wants to avoid.  Follow-up again in 4 to 8 weeks.  Responded well to manipulation

## 2018-10-08 DIAGNOSIS — R7303 Prediabetes: Secondary | ICD-10-CM | POA: Diagnosis not present

## 2018-10-08 DIAGNOSIS — Z0000 Encounter for general adult medical examination without abnormal findings: Secondary | ICD-10-CM | POA: Diagnosis not present

## 2018-10-08 DIAGNOSIS — Z23 Encounter for immunization: Secondary | ICD-10-CM | POA: Diagnosis not present

## 2018-10-08 DIAGNOSIS — E7800 Pure hypercholesterolemia, unspecified: Secondary | ICD-10-CM | POA: Diagnosis not present

## 2018-10-20 NOTE — Progress Notes (Signed)
[name] [place],  [address] Phone: [phone] Subjective:    I Dawn [name] am serving as a Education administrator for Dr. Hulan Saas.    CC: Bilateral knee pain  EGB:[id number]  Dawn [name] is a 63 y.o. female coming in with complaint of bilateral knee pain. Knees are doing well. Left hand has numbness and tingling on the ulnar side. Was told by PCP that it could be tennis elbow. Has been taking care of her mother which consist of lifting her a lot. States her hand gets itchy as well.   Back pain as well seen patient previously for the back pain.  Has responded well to manipulation.  Mild tightness noted.  Has been lifting her mother on a more regular basis  Onset- 3 weeks  Location- left ulnar side of hand  Severity-5 out of 10     Past Medical History:  Diagnosis Date  . Abnormal glucose   . Allergic rhinitis   . Anxiety   . Arthritis   . Carpal tunnel syndrome    right  . Cervical dysplasia    1996  . Endometriosis   . Fibroids   . Hemorrhoids   . High cholesterol   . Hyperlipemia   . Hypertension   . Migraine   . Neck pain   . Stroke [facility]) 2014   TIA  . Transient cerebral ischemia    10-22-12 ED, tia s/s but negative work up in ED. see note    Past Surgical History:  Procedure Laterality Date  . ABDOMINAL HYSTERECTOMY  Partial  . CARPAL TUNNEL RELEASE Right   . cold knife cone     for dysplasia  . COLONOSCOPY     last 11-2008 w/patterson, hems only   . KNEE ARTHROSCOPY WITH MEDIAL MENISECTOMY Right 03/28/2016   Procedure: RIGHT KNEE ARTHROSCOPY CHONDROPLASTY WITH MEDIAL MENISCECTOMY;  Surgeon: [name], MD;  Location: [place];  Service: Orthopedics;  Laterality: Right;  . TRIGGER FINGER RELEASE     thumb, middle finger   . TUBAL LIGATION     Social History   Socioeconomic History  . Marital status: Married    Spouse name: Not on file  . Number of children: 1  . Years of  education: Not on file  . Highest education level: Not on file  Occupational History  . Occupation: Retired  Scientific laboratory technician  . Financial resource strain: Not on file  . Food insecurity:    Worry: Not on file    Inability: Not on file  . Transportation needs:    Medical: Not on file    Non-medical: Not on file  Tobacco Use  . Smoking status: Never Smoker  . Smokeless tobacco: Never Used  Substance and Sexual Activity  . Alcohol use: Yes    Alcohol/week: 1.0 standard drinks    Types: 1 Glasses of wine per week    Comment: occasionally  . Drug use: No  . Sexual activity: Never  Lifestyle  . Physical activity:    Days per week: Not on file    Minutes per session: Not on file  . Stress: Not on file  Relationships  . Social connections:    Talks on phone: Not on file    Gets together: Not on file    Attends religious service: Not on file    Active member of club or organization: Not on file    Attends meetings of clubs or  organizations: Not on file    Relationship status: Not on file  Other Topics Concern  . Not on file  Social History Narrative  . Not on file   Allergies  Allergen Reactions  . Sulfa Antibiotics Rash   Family History  Problem Relation Age of Onset  . Colon cancer Father   . Stomach cancer Father   . Diabetes Mother   . Hypertension Mother   . CVA Mother   . Kidney disease Mother   . Aneurysm Mother   . Colon cancer Maternal Uncle   . Breast cancer Maternal Aunt   . Kidney disease Maternal Aunt   . Heart disease Brother   . Throat cancer Maternal Uncle   . Rectal cancer Neg Hx      Current Outpatient Medications (Cardiovascular):  .  rosuvastatin (CRESTOR) 5 MG tablet, Take 5 mg by mouth daily. Marland Kitchen  triamterene-hydrochlorothiazide (MAXZIDE-25) 37.5-25 MG per tablet, Take 1 tablet by mouth daily.   Current Outpatient Medications (Analgesics):  .  aspirin 81 MG chewable tablet, Chew 81 mg by mouth daily. .  naproxen sodium (ANAPROX) 220 MG  tablet, Take 220 mg by mouth 2 (two) times daily with a meal.   Current Outpatient Medications (Other):  .  cholecalciferol (VITAMIN D) 1000 UNITS tablet, Take 2,000 Units by mouth daily. .  cyclobenzaprine (FLEXERIL) 10 MG tablet, Take 10 mg by mouth 3 (three) times daily as needed. For headache .  Diclofenac Sodium (PENNSAID) 2 % SOLN, Place onto the skin. .  Multiple Vitamin (MULTIVITAMIN WITH MINERALS) TABS, Take 1 tablet by mouth daily.    Past medical history, social, surgical and family history all reviewed in electronic medical record.  No pertanent information unless stated regarding to the chief complaint.   Review of Systems:  No headache, visual changes, nausea, vomiting, diarrhea, constipation, dizziness, abdominal pain, skin rash, fevers, chills, night sweats, weight loss, swollen lymph nodes, body aches, joint swelling,  chest pain, shortness of breath, mood changes.  Positive muscle aches  Objective  There were no vitals taken for this visit.    General: No apparent distress alert and oriented x3 mood and affect normal, dressed appropriately.  HEENT: Pupils equal, extraocular movements intact  Respiratory: Patient's speak in full sentences and does not appear short of breath  Cardiovascular: No lower extremity edema, non tender, no erythema  Skin: Warm dry intact with no signs of infection or rash on extremities or on axial skeleton.  Abdomen: Soft nontender  Neuro: Cranial nerves II through XII are intact, neurovascularly intact in all extremities with 2+ DTRs and 2+ pulses.  Lymph: No lymphadenopathy of posterior or anterior cervical chain or axillae bilaterally.  Gait normal with good balance and coordination.  MSK:  Non tender with full range of motion and good stability and symmetric strength and tone of shoulders, elbows, wrist, hip, and ankles bilaterally.  Knee: Bilateral valgus deformity noted. Large thigh to calf ratio.  Tender to palpation over medial and PF  joint line.  ROM full in flexion and extension and lower leg rotation. instability with valgus force.  painful patellar compression. Patellar glide with moderate crepitus. Patellar and quadriceps tendons unremarkable. Hamstring and quadriceps strength is normal.   Neck exam shows the patient has loss of the last 10 degrees of extension.  Mild positive Spurling's with radicular symptoms in the C8 distribution on the left side.  Patient has full strength though noted of the upper extremity.  Deep tendon reflexes intact and symmetric.  Low back exam shows a loss of at least 5 degrees in all planes.  Tightness of the [facility] test bilaterally.  Tenderness over the right sacroiliac joint.  Osteopathic findings C3 flexed rotated and side bent right C6 flexed rotated and side bent left T3 extended rotated and side bent right inhaled third rib L2 flexed rotated and side bent right Sacrum right on right   Limited musculoskeletal ultrasound was performed and interpreted by Lyndal Pulley  Limited ultrasound of patient's wrist shows no enlargement of the median or ulnar nerves noted.  Very minimal degenerative changes of the wrist joints noted.  Otherwise unremarkable Impression: Relatively normal wrist   Impression and Recommendations:     This case required medical decision making of moderate complexity. The above documentation has been reviewed and is accurate and complete Lyndal Pulley, DO       Note: This dictation was prepared with Dragon dictation along with smaller phrase technology. Any transcriptional errors that result from this process are unintentional.

## 2018-10-21 ENCOUNTER — Ambulatory Visit (INDEPENDENT_AMBULATORY_CARE_PROVIDER_SITE_OTHER): Payer: Federal, State, Local not specified - PPO | Admitting: Family Medicine

## 2018-10-21 ENCOUNTER — Encounter: Payer: Self-pay | Admitting: Family Medicine

## 2018-10-21 VITALS — BP 126/80 | HR 75 | Ht 62.0 in | Wt 182.0 lb

## 2018-10-21 DIAGNOSIS — M999 Biomechanical lesion, unspecified: Secondary | ICD-10-CM | POA: Diagnosis not present

## 2018-10-21 DIAGNOSIS — M47812 Spondylosis without myelopathy or radiculopathy, cervical region: Secondary | ICD-10-CM

## 2018-10-21 NOTE — Assessment & Plan Note (Signed)
Decision today to treat with OMT was based on Physical Exam  After verbal consent patient was treated with HVLA, ME, FPR techniques in cervical, thoracic, rib lumbar and sacral areas  Patient tolerated the procedure well with improvement in symptoms  Patient given exercises, stretches and lifestyle modifications  See medications in patient instructions if given  Patient will follow up in 4 weeks 

## 2018-10-21 NOTE — Assessment & Plan Note (Signed)
Believe the patient's wrist pain is secondary to more of a cervical radiculopathy.  We attempted osteopathic manipulation today.  We discussed icing regimen and home exercise.  Discussed which activities to do which wants to avoid.  Differential includes a possible ulnar neuropathy secondary to medial elbow compression.  I think this is highly unlikely.  Patient's wrist seems to be unremarkable.  Limited musculoskeletal ultrasound did not show any type of enlargement of the ulnar nerve.  Follow-up again 4 weeks

## 2018-10-21 NOTE — Patient Instructions (Signed)
Good to see you  Ice is your friend Lets watch the hand Stay active and find time for yourself See me again in 4 weeks

## 2018-11-17 NOTE — Progress Notes (Signed)
[name] [place], [place] [address] Phone: [phone] Subjective:     CC: Back and neck pain  EVO:[id number]  Dawn [name] is a 63 y.o. female coming in with complaint of back pain. No change in pain since last visit. Numbness in fingers is going away. Only the 5th finger has tingling. Is here for OMT to manage her pain. Looking progress overall.  Patient does feel much better overall Patient is seen some no weakness in the hand.  Is making able to start increasing activity.      Past Medical History:  Diagnosis Date  . Abnormal glucose   . Allergic rhinitis   . Anxiety   . Arthritis   . Carpal tunnel syndrome    right  . Cervical dysplasia    1996  . Endometriosis   . Fibroids   . Hemorrhoids   . High cholesterol   . Hyperlipemia   . Hypertension   . Migraine   . Neck pain   . Stroke [facility]) 2014   TIA  . Transient cerebral ischemia    10-22-12 ED, tia s/s but negative work up in ED. see note    Past Surgical History:  Procedure Laterality Date  . ABDOMINAL HYSTERECTOMY  Partial  . CARPAL TUNNEL RELEASE Right   . cold knife cone     for dysplasia  . COLONOSCOPY     last 11-2008 w/patterson, hems only   . KNEE ARTHROSCOPY WITH MEDIAL MENISECTOMY Right 03/28/2016   Procedure: RIGHT KNEE ARTHROSCOPY CHONDROPLASTY WITH MEDIAL MENISCECTOMY;  Surgeon: [name], MD;  Location: [place];  Service: Orthopedics;  Laterality: Right;  . TRIGGER FINGER RELEASE     thumb, middle finger   . TUBAL LIGATION     Social History   Socioeconomic History  . Marital status: Married    Spouse name: Not on file  . Number of children: 1  . Years of education: Not on file  . Highest education level: Not on file  Occupational History  . Occupation: Retired  Scientific laboratory technician  . Financial resource strain: Not on file  . Food insecurity:    Worry: Not on file    Inability: Not on file  . Transportation needs:   Medical: Not on file    Non-medical: Not on file  Tobacco Use  . Smoking status: Never Smoker  . Smokeless tobacco: Never Used  Substance and Sexual Activity  . Alcohol use: Yes    Alcohol/week: 1.0 standard drinks    Types: 1 Glasses of wine per week    Comment: occasionally  . Drug use: No  . Sexual activity: Never  Lifestyle  . Physical activity:    Days per week: Not on file    Minutes per session: Not on file  . Stress: Not on file  Relationships  . Social connections:    Talks on phone: Not on file    Gets together: Not on file    Attends religious service: Not on file    Active member of club or organization: Not on file    Attends meetings of clubs or organizations: Not on file    Relationship status: Not on file  Other Topics Concern  . Not on file  Social History Narrative  . Not on file   Allergies  Allergen Reactions  . Sulfa Antibiotics Rash   Family History  Problem Relation Age of Onset  . Colon cancer Father   .  Stomach cancer Father   . Diabetes Mother   . Hypertension Mother   . CVA Mother   . Kidney disease Mother   . Aneurysm Mother   . Colon cancer Maternal Uncle   . Breast cancer Maternal Aunt   . Kidney disease Maternal Aunt   . Heart disease Brother   . Throat cancer Maternal Uncle   . Rectal cancer Neg Hx      Current Outpatient Medications (Cardiovascular):  .  rosuvastatin (CRESTOR) 5 MG tablet, Take 5 mg by mouth daily. Marland Kitchen  triamterene-hydrochlorothiazide (MAXZIDE-25) 37.5-25 MG per tablet, Take 1 tablet by mouth daily.   Current Outpatient Medications (Analgesics):  .  aspirin 81 MG chewable tablet, Chew 81 mg by mouth daily. .  naproxen sodium (ANAPROX) 220 MG tablet, Take 220 mg by mouth 2 (two) times daily with a meal.   Current Outpatient Medications (Other):  .  cholecalciferol (VITAMIN D) 1000 UNITS tablet, Take 2,000 Units by mouth daily. .  cyclobenzaprine (FLEXERIL) 10 MG tablet, Take 10 mg by mouth 3 (three) times  daily as needed. For headache .  Diclofenac Sodium (PENNSAID) 2 % SOLN, Place onto the skin. .  Multiple Vitamin (MULTIVITAMIN WITH MINERALS) TABS, Take 1 tablet by mouth daily. Marland Kitchen  gabapentin (NEURONTIN) 100 MG capsule, Take 2 capsules (200 mg total) by mouth at bedtime.    Past medical history, social, surgical and family history all reviewed in electronic medical record.  No pertanent information unless stated regarding to the chief complaint.   Review of Systems:  No headache, visual changes, nausea, vomiting, diarrhea, constipation, dizziness, abdominal pain, skin rash, fevers, chills, night sweats, weight loss, swollen lymph nodes, body aches, joint swelling, muscle aches, chest pain, shortness of breath, mood changes.   Objective  Blood pressure (!) 130/92, pulse 75, height 5\' 2"  (1.575 m), weight 182 lb (82.6 kg), SpO2 96 %.    General: No apparent distress alert and oriented x3 mood and affect normal, dressed appropriately.  HEENT: Pupils equal, extraocular movements intact  Respiratory: Patient's speak in full sentences and does not appear short of breath  Cardiovascular: No lower extremity edema, non tender, no erythema  Skin: Warm dry intact with no signs of infection or rash on extremities or on axial skeleton.  Abdomen: Soft nontender  Neuro: Cranial nerves II through XII are intact, neurovascularly intact in all extremities with 2+ DTRs and 2+ pulses.  Lymph: No lymphadenopathy of posterior or anterior cervical chain or axillae bilaterally.  Gait mild antalgic MSK:  Non tender with full range of motion and good stability and symmetric strength and tone of shoulders, elbows, wrist, hip, knee and ankles bilaterally.  Neck exam has some mild loss of lordosis.  Some mild decrease in sidebending bilaterally.  Near full range of motion though with flexion with 95 degrees loss of extension.  Negative Spurling's today.  Full strength of the upper extremities.  Osteopathic  findings C2 flexed rotated and side bent right C6 flexed rotated and side bent left T3 extended rotated and side bent right inhaled third rib T7 extended rotated and side bent left L3 flexed rotated and side bent right Sacrum right on right     Impression and Recommendations:     This case required medical decision making of moderate complexity. The above documentation has been reviewed and is accurate and complete Lyndal Pulley, DO       Note: This dictation was prepared with Dragon dictation along with smaller phrase technology. Any  transcriptional errors that result from this process are unintentional.

## 2018-11-18 ENCOUNTER — Ambulatory Visit (INDEPENDENT_AMBULATORY_CARE_PROVIDER_SITE_OTHER): Payer: Federal, State, Local not specified - PPO | Admitting: Family Medicine

## 2018-11-18 ENCOUNTER — Encounter: Payer: Self-pay | Admitting: Family Medicine

## 2018-11-18 VITALS — BP 130/92 | HR 75 | Ht 62.0 in | Wt 182.0 lb

## 2018-11-18 DIAGNOSIS — M47812 Spondylosis without myelopathy or radiculopathy, cervical region: Secondary | ICD-10-CM | POA: Diagnosis not present

## 2018-11-18 DIAGNOSIS — M999 Biomechanical lesion, unspecified: Secondary | ICD-10-CM | POA: Diagnosis not present

## 2018-11-18 MED ORDER — GABAPENTIN 100 MG PO CAPS: 200.0000 mg | ORAL_CAPSULE | Freq: Every day | ORAL | 3 refills | Status: DC

## 2018-11-18 NOTE — Assessment & Plan Note (Signed)
Decision today to treat with OMT was based on Physical Exam  After verbal consent patient was treated with HVLA, ME, FPR techniques in cervical, thoracic, rib, lumbar and sacral areas  Patient tolerated the procedure well with improvement in symptoms  Patient given exercises, stretches and lifestyle modifications  See medications in patient instructions if given  Patient will follow up in 4-6 weeks 

## 2018-11-18 NOTE — Assessment & Plan Note (Signed)
Known arthritic changes.  Was having radicular symptoms but improved.  No significant weakness noted.  Negative Spurling's today.  Responding well to manipulation.  Started gabapentin.  Follow-up again in 6 weeks

## 2018-11-18 NOTE — Patient Instructions (Signed)
Great to see you  Gabapentin 200mg  at night Stay active I think you are doing great  See me again In 6 weeks

## 2018-12-30 ENCOUNTER — Ambulatory Visit: Payer: Federal, State, Local not specified - PPO | Admitting: Family Medicine

## 2019-03-17 ENCOUNTER — Encounter: Payer: Self-pay | Admitting: Family Medicine

## 2019-03-17 ENCOUNTER — Other Ambulatory Visit: Payer: Self-pay

## 2019-03-17 ENCOUNTER — Ambulatory Visit: Payer: Federal, State, Local not specified - PPO | Admitting: Family Medicine

## 2019-03-17 DIAGNOSIS — M170 Bilateral primary osteoarthritis of knee: Secondary | ICD-10-CM

## 2019-03-17 NOTE — Patient Instructions (Signed)
Great to see you  It has been 6 months for the injections If this does not last as long can do gel injections  See me again in 4-6 weeks if not much better otherwise see me when you need me

## 2019-03-17 NOTE — Assessment & Plan Note (Addendum)
Actions given today.  Tolerated the procedure well.  Discussed icing regimen and home exercises.  Discussed which activities of doing which wants to avoid.  Patient could be a candidate for Visco supplementation.  Patient also secondary to abnormal thigh and calf ratio and instability of the knee will be fitted for a custom brace.  This will be indefinitely or greater than 99 months.  Discussed about that and follow-up again in 4 to 6 weeks.

## 2019-03-17 NOTE — Progress Notes (Signed)
[name] [address], [place] [address] Phone: [phone] Subjective:   Dawn [name], am serving as a scribe for Dr. Hulan Saas.   CC: Bilateral.  Knee pain   [id number]   11/18/2018: Known arthritic changes.  Was having radicular symptoms but improved.  [name] significant weakness noted.  Negative Spurling's today.  Responding well to manipulation.  Started gabapentin.  Follow-up again in 6 weeks  Update 03/17/2019: Dawn [name] is a 63 y.o. female coming in with complaint of bilateral knee pain. Left greater than right. Constant pain. Using Tylenol and Motrin prn.  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  Patient has responded fairly well to these injections previously.  Having worsening pain that is affecting daily activities.    Past Medical History:  Diagnosis Date  . Abnormal glucose   . Allergic rhinitis   . Anxiety   . Arthritis   . Carpal tunnel syndrome    right  . Cervical dysplasia    1996  . Endometriosis   . Fibroids   . Hemorrhoids   . High cholesterol   . Hyperlipemia   . Hypertension   . Migraine   . Neck pain   . Stroke [facility]) 2014   TIA  . Transient cerebral ischemia    10-22-12 ED, tia s/s but negative work up in ED. see note    Past Surgical History:  Procedure Laterality Date  . ABDOMINAL HYSTERECTOMY  Partial  . CARPAL TUNNEL RELEASE Right   . cold knife cone     for dysplasia  . COLONOSCOPY     last 11-2008 w/patterson, hems only   . KNEE ARTHROSCOPY WITH MEDIAL MENISECTOMY Right 03/28/2016   Procedure: RIGHT KNEE ARTHROSCOPY CHONDROPLASTY WITH MEDIAL MENISCECTOMY;  Surgeon: [name], MD;  Location: [place];  Service: Orthopedics;  Laterality: Right;  . TRIGGER FINGER RELEASE     thumb, middle finger   . TUBAL LIGATION     Social History   Socioeconomic History  . Marital status: Married    Spouse name: Not  on file  . Number of children: 1  . Years of education: Not on file  . Highest education level: Not on file  Occupational History  . Occupation: Retired  Scientific laboratory technician  . Financial resource strain: Not on file  . Food insecurity    Worry: Not on file    Inability: Not on file  . Transportation needs    Medical: Not on file    Non-medical: Not on file  Tobacco Use  . Smoking status: Never Smoker  . Smokeless tobacco: Never Used  Substance and Sexual Activity  . Alcohol use: Yes    Alcohol/week: 1.0 standard drinks    Types: 1 Glasses of wine per week    Comment: occasionally  . Drug use: [name]  . Sexual activity: Never  Lifestyle  . Physical activity    Days per week: Not on file    Minutes per session: Not on file  . Stress: Not on file  Relationships  . Social Herbalist on phone: Not on file    Gets together: Not on file    Attends religious service: Not on file    Active member of club or organization: Not on file    Attends meetings of clubs or organizations: Not on file    Relationship status: Not on file  Other Topics Concern  . Not on file  Social History Narrative  . Not on file   Allergies  Allergen Reactions  . Sulfa Antibiotics Rash   Family History  Problem Relation Age of Onset  . Colon cancer Father   . Stomach cancer Father   . Diabetes Mother   . Hypertension Mother   . CVA Mother   . Kidney disease Mother   . Aneurysm Mother   . Colon cancer Maternal Uncle   . Breast cancer Maternal Aunt   . Kidney disease Maternal Aunt   . Heart disease Brother   . Throat cancer Maternal Uncle   . Rectal cancer Neg Hx      Current Outpatient Medications (Cardiovascular):  .  rosuvastatin (CRESTOR) 5 MG tablet, Take 5 mg by mouth daily. Marland Kitchen  triamterene-hydrochlorothiazide (MAXZIDE-25) 37.5-25 MG per tablet, Take 1 tablet by mouth daily.   Current Outpatient Medications (Analgesics):  .  aspirin 81 MG chewable tablet, Chew 81 mg by mouth  daily. .  naproxen sodium (ANAPROX) 220 MG tablet, Take 220 mg by mouth 2 (two) times daily with a meal.   Current Outpatient Medications (Other):  .  cholecalciferol (VITAMIN D) 1000 UNITS tablet, Take 2,000 Units by mouth daily. .  cyclobenzaprine (FLEXERIL) 10 MG tablet, Take 10 mg by mouth 3 (three) times daily as needed. For headache .  Diclofenac Sodium (PENNSAID) 2 % SOLN, Place onto the skin. Marland Kitchen  gabapentin (NEURONTIN) 100 MG capsule, Take 2 capsules (200 mg total) by mouth at bedtime. .  Multiple Vitamin (MULTIVITAMIN WITH MINERALS) TABS, Take 1 tablet by mouth daily.    Past medical history, social, surgical and family history all reviewed in electronic medical record.  [name] pertanent information unless stated regarding to the chief complaint.   Review of Systems:  [name] headache, visual changes, nausea, vomiting, diarrhea, constipation, dizziness, abdominal pain, skin rash, fevers, chills, night sweats, weight loss, swollen lymph nodes, body aches,chest pain, shortness of breath, mood changes.  Positive muscle aches joint swelling  Objective  Blood pressure (!) 138/100, pulse (!) 102, height 5\' 2"  (1.575 m), weight 179 lb (81.2 kg), SpO2 97 %.    General: [name] apparent distress alert and oriented x3 mood and affect normal, dressed appropriately.  HEENT: Pupils equal, extraocular movements intact  Respiratory: Patient's speak in full sentences and does not appear short of breath  Cardiovascular: 1+ lower extremity edema, non tender, [name] erythema  Skin: Warm dry intact with [name] signs of infection or rash on extremities or on axial skeleton.  Abdomen: Soft nontender  Neuro: Cranial nerves II through XII are intact, neurovascularly intact in all extremities with 2+ DTRs and 2+ pulses.  Lymph: [name] lymphadenopathy of posterior or anterior cervical chain or axillae bilaterally.  Gait severely antalgic MSK:  tender with full range of motion and good stability and symmetric strength and tone of  shoulders, elbows, wrist, hip, and ankles bilaterally.  Knee: Bilateral valgus deformity noted. Large thigh to calf ratio.  Tender to palpation over medial and PF joint line.  ROM full in flexion and extension and lower leg rotation. instability with valgus force.  painful patellar compression. Patellar glide with moderate crepitus. Patellar and quadriceps tendons unremarkable. Hamstring and quadriceps strength is normal.  After informed written and verbal consent, patient was seated on exam table. Right knee was prepped with alcohol swab and utilizing anterolateral approach, patient's right knee space was injected with 4:1  marcaine 0.5%: Kenalog 40mg /dL. Patient tolerated the procedure  well without immediate complications.  After informed written and verbal consent, patient was seated on exam table. Left knee was prepped with alcohol swab and utilizing anterolateral approach, patient's left knee space was injected with 4:1  marcaine 0.5%: Kenalog 40mg /dL. Patient tolerated the procedure well without immediate complications.   Impression and Recommendations:     This case required medical decision making of moderate complexity. The above documentation has been reviewed and is accurate and complete Lyndal Pulley, DO       Note: This dictation was prepared with Dragon dictation along with smaller phrase technology. Any transcriptional errors that result from this process are unintentional.

## 2019-03-26 DIAGNOSIS — Z20828 Contact with and (suspected) exposure to other viral communicable diseases: Secondary | ICD-10-CM | POA: Diagnosis not present

## 2019-04-05 DIAGNOSIS — R21 Rash and other nonspecific skin eruption: Secondary | ICD-10-CM | POA: Diagnosis not present

## 2019-04-12 DIAGNOSIS — R7303 Prediabetes: Secondary | ICD-10-CM | POA: Diagnosis not present

## 2019-04-12 DIAGNOSIS — E7800 Pure hypercholesterolemia, unspecified: Secondary | ICD-10-CM | POA: Diagnosis not present

## 2019-04-12 DIAGNOSIS — I10 Essential (primary) hypertension: Secondary | ICD-10-CM | POA: Diagnosis not present

## 2019-04-13 DIAGNOSIS — Z01419 Encounter for gynecological examination (general) (routine) without abnormal findings: Secondary | ICD-10-CM | POA: Diagnosis not present

## 2019-04-13 DIAGNOSIS — Z6832 Body mass index (BMI) 32.0-32.9, adult: Secondary | ICD-10-CM | POA: Diagnosis not present

## 2019-04-13 DIAGNOSIS — Z1231 Encounter for screening mammogram for malignant neoplasm of breast: Secondary | ICD-10-CM | POA: Diagnosis not present

## 2019-04-13 DIAGNOSIS — N952 Postmenopausal atrophic vaginitis: Secondary | ICD-10-CM | POA: Diagnosis not present

## 2019-04-19 ENCOUNTER — Ambulatory Visit: Payer: Federal, State, Local not specified - PPO | Admitting: Family Medicine

## 2019-05-26 DIAGNOSIS — R58 Hemorrhage, not elsewhere classified: Secondary | ICD-10-CM | POA: Diagnosis not present

## 2019-05-27 DIAGNOSIS — R58 Hemorrhage, not elsewhere classified: Secondary | ICD-10-CM | POA: Diagnosis not present

## 2019-07-11 NOTE — Progress Notes (Signed)
[name] [address], [place] [address] Phone: [phone] Subjective:   I Dawn [name] am serving as a Education administrator for Dr. Hulan Saas.  I'm seeing this patient by the request  of:    CC: Knee pain follow-up, neck pain  [id number]   Actions given today.  Tolerated the procedure well.  Discussed icing regimen and home exercises.  Discussed which activities of doing which wants to avoid.  Patient could be a candidate for Visco supplementation.  Patient also secondary to abnormal thigh and calf ratio and instability of the knee will be fitted for a custom brace.  This will be indefinitely or greater than 99 months.  Discussed about that and follow-up again in 4 to 6 weeks.  07/13/2019 Dawn [name] is a 63 y.o. female coming in with complaint of bilateral knee pain. Bilateral injections.  Known severe arthritic changes had viscosupplementation previously.  Did do well for a while but now pain again.  Patient denies any new symptoms or worsening of previously placed.      Past Medical History:  Diagnosis Date  . Abnormal glucose   . Allergic rhinitis   . Anxiety   . Arthritis   . Carpal tunnel syndrome    right  . Cervical dysplasia    1996  . Endometriosis   . Fibroids   . Hemorrhoids   . High cholesterol   . Hyperlipemia   . Hypertension   . Migraine   . Neck pain   . Stroke [facility]) 2014   TIA  . Transient cerebral ischemia    10-22-12 ED, tia s/s but negative work up in ED. see note    Past Surgical History:  Procedure Laterality Date  . ABDOMINAL HYSTERECTOMY  Partial  . CARPAL TUNNEL RELEASE Right   . cold knife cone     for dysplasia  . COLONOSCOPY     last 11-2008 w/patterson, hems only   . KNEE ARTHROSCOPY WITH MEDIAL MENISECTOMY Right 03/28/2016   Procedure: RIGHT KNEE ARTHROSCOPY CHONDROPLASTY WITH MEDIAL MENISCECTOMY;  Surgeon: [name], MD;  Location: [employer];  Service: Orthopedics;   Laterality: Right;  . TRIGGER FINGER RELEASE     thumb, middle finger   . TUBAL LIGATION     Social History   Socioeconomic History  . Marital status: Married    Spouse name: Not on file  . Number of children: 1  . Years of education: Not on file  . Highest education level: Not on file  Occupational History  . Occupation: Retired  Scientific laboratory technician  . Financial resource strain: Not on file  . Food insecurity    Worry: Not on file    Inability: Not on file  . Transportation needs    Medical: Not on file    Non-medical: Not on file  Tobacco Use  . Smoking status: Never Smoker  . Smokeless tobacco: Never Used  Substance and Sexual Activity  . Alcohol use: Yes    Alcohol/week: 1.0 standard drinks    Types: 1 Glasses of wine per week    Comment: occasionally  . Drug use: No  . Sexual activity: Never  Lifestyle  . Physical activity    Days per week: Not on file    Minutes per session: Not on file  . Stress: Not on file  Relationships  . Social Herbalist on phone: Not on file    Gets together: Not on  file    Attends religious service: Not on file    Active member of club or organization: Not on file    Attends meetings of clubs or organizations: Not on file    Relationship status: Not on file  Other Topics Concern  . Not on file  Social History Narrative  . Not on file   Allergies  Allergen Reactions  . Sulfa Antibiotics Rash   Family History  Problem Relation Age of Onset  . Colon cancer Father   . Stomach cancer Father   . Diabetes Mother   . Hypertension Mother   . CVA Mother   . Kidney disease Mother   . Aneurysm Mother   . Colon cancer Maternal Uncle   . Breast cancer Maternal Aunt   . Kidney disease Maternal Aunt   . Heart disease Brother   . Throat cancer Maternal Uncle   . Rectal cancer Neg Hx      Current Outpatient Medications (Cardiovascular):  .  rosuvastatin (CRESTOR) 5 MG tablet, Take 5 mg by mouth daily. Marland Kitchen   triamterene-hydrochlorothiazide (MAXZIDE-25) 37.5-25 MG per tablet, Take 1 tablet by mouth daily.   Current Outpatient Medications (Analgesics):  .  aspirin 81 MG chewable tablet, Chew 81 mg by mouth daily. .  naproxen sodium (ANAPROX) 220 MG tablet, Take 220 mg by mouth 2 (two) times daily with a meal.   Current Outpatient Medications (Other):  .  cholecalciferol (VITAMIN D) 1000 UNITS tablet, Take 2,000 Units by mouth daily. .  cyclobenzaprine (FLEXERIL) 10 MG tablet, Take 10 mg by mouth 3 (three) times daily as needed. For headache .  Diclofenac Sodium (PENNSAID) 2 % SOLN, Place onto the skin. .  Multiple Vitamin (MULTIVITAMIN WITH MINERALS) TABS, Take 1 tablet by mouth daily.    Past medical history, social, surgical and family history all reviewed in electronic medical record.  No pertanent information unless stated regarding to the chief complaint.   Review of Systems:  No headache, visual changes, nausea, vomiting, diarrhea, constipation, dizziness, abdominal pain, skin rash, fevers, chills, night sweats, weight loss, swollen lymph nodes, body aches, joint swelling, , chest pain, shortness of breath, mood changes.  Positive muscle Objective  Blood pressure 130/80, pulse 86, height 5\' 2"  (1.575 m), weight 173 lb (78.5 kg), SpO2 98 %.    General: No apparent distress alert and oriented x3 mood and affect normal, dressed appropriately.  HEENT: Pupils equal, extraocular movements intact  Respiratory: Patient's speak in full sentences and does not appear short of breath  Cardiovascular: No lower extremity edema, non tender, no erythema  Skin: Warm dry intact with no signs of infection or rash on extremities or on axial skeleton.  Abdomen: Soft nontender  Neuro: Cranial nerves II through XII are intact, neurovascularly intact in all extremities with 2+ DTRs and 2+ pulses.  Lymph: No lymphadenopathy of posterior or anterior cervical chain or axillae bilaterally.  Gait normal with good  balance and coordination.  MSK:  Non tender with full range of motion and good stability and symmetric strength and tone of shoulders, elbows, wrist, hip and ankles bilaterally.  Knee: Bilateral valgus deformity noted. Large thigh to calf ratio.  Tender to palpation over medial and PF joint line.  ROM full in flexion and extension and lower leg rotation. instability with valgus force.  painful patellar compression. Patellar glide with moderate crepitus. Patellar and quadriceps tendons unremarkable. Hamstring and quadriceps strength is normal.  After informed written and verbal consent, patient was seated on  exam table. Right knee was prepped with alcohol swab and utilizing anterolateral approach, patient's right knee space was injected with 4:1  marcaine 0.5%: Kenalog 40mg /dL. Patient tolerated the procedure well without immediate complications.  After informed written and verbal consent, patient was seated on exam table. Left knee was prepped with alcohol swab and utilizing anterolateral approach, patient's left knee space was injected with 4:1  marcaine 0.5%: Kenalog 40mg /dL. Patient tolerated the procedure well without immediate complications.      Impression and Recommendations:     This case required medical decision making of moderate complexity. The above documentation has been reviewed and is accurate and complete Lyndal Pulley, DO       Note: This dictation was prepared with Dragon dictation along with smaller phrase technology. Any transcriptional errors that result from this process are unintentional.

## 2019-07-13 ENCOUNTER — Ambulatory Visit (INDEPENDENT_AMBULATORY_CARE_PROVIDER_SITE_OTHER): Payer: Federal, State, Local not specified - PPO | Admitting: Family Medicine

## 2019-07-13 ENCOUNTER — Encounter: Payer: Self-pay | Admitting: Family Medicine

## 2019-07-13 ENCOUNTER — Other Ambulatory Visit: Payer: Self-pay

## 2019-07-13 DIAGNOSIS — M170 Bilateral primary osteoarthritis of knee: Secondary | ICD-10-CM

## 2019-07-13 NOTE — Assessment & Plan Note (Signed)
Repeat injection given today.  Discussed icing regimen and home exercise, discussed which activities of doing which wants to avoid.  Patient is to increase activity as tolerated.  Follow-up again in 4 to 6 weeks

## 2019-07-13 NOTE — Patient Instructions (Addendum)
Good to see you Follow up with me December 17th or later

## 2019-07-13 NOTE — Assessment & Plan Note (Deleted)
Decision today to treat with OMT was based on Physical Exam  After verbal consent patient was treated with HVLA, ME, FPR techniques in cervical, thoracic, rib areas  Patient tolerated the procedure well with improvement in symptoms  Patient given exercises, stretches and lifestyle modifications  See medications in patient instructions if given  Patient will follow up in 4-6 weeks 

## 2019-08-02 DIAGNOSIS — Z111 Encounter for screening for respiratory tuberculosis: Secondary | ICD-10-CM | POA: Diagnosis not present

## 2019-08-04 DIAGNOSIS — M545 Low back pain: Secondary | ICD-10-CM | POA: Diagnosis not present

## 2019-08-04 DIAGNOSIS — M5412 Radiculopathy, cervical region: Secondary | ICD-10-CM | POA: Diagnosis not present

## 2019-08-04 DIAGNOSIS — I10 Essential (primary) hypertension: Secondary | ICD-10-CM | POA: Diagnosis not present

## 2019-08-10 DIAGNOSIS — G894 Chronic pain syndrome: Secondary | ICD-10-CM | POA: Diagnosis not present

## 2019-08-10 DIAGNOSIS — M542 Cervicalgia: Secondary | ICD-10-CM | POA: Diagnosis not present

## 2019-08-23 DIAGNOSIS — M542 Cervicalgia: Secondary | ICD-10-CM | POA: Diagnosis not present

## 2019-08-23 DIAGNOSIS — G894 Chronic pain syndrome: Secondary | ICD-10-CM | POA: Diagnosis not present

## 2019-09-16 NOTE — Progress Notes (Signed)
[name] [address] [place], [place] [address] Phone: [phone] Subjective:   [name], am serving as a scribe for Dr. Hulan Saas. This visit occurred during the SARS-CoV-2 public health emergency.  Safety protocols were in place, including screening questions prior to the visit, additional usage of staff PPE, and extensive cleaning of exam room while observing appropriate contact time as indicated for disinfecting solutions.    CC: Bilateral knee pain follow-up  [id number]   07/13/2019 Repeat injection given today.  Discussed icing regimen and home exercise, discussed which activities of doing which wants to avoid.  Patient is to increase activity as tolerated.  Follow-up again in 4 to 6 weeks  Update 09/16/2019 Dawn [name] is a 63 y.o. female coming in with complaint of bilateral knee pain. Patient states that she is having an increase in her knee pain. Did have some relief from the injections given in October.  Known severe arthritic changes bilaterally.  Patient has responded fairly well to steroid injections previously.  Wants to continue to do that.  Feels like pain ".     Past Medical History:  Diagnosis Date  . Abnormal glucose   . Allergic rhinitis   . Anxiety   . Arthritis   . Carpal tunnel syndrome    right  . Cervical dysplasia    1996  . Endometriosis   . Fibroids   . Hemorrhoids   . High cholesterol   . Hyperlipemia   . Hypertension   . Migraine   . Neck pain   . Stroke [facility]) 2014   TIA  . Transient cerebral ischemia    10-22-12 ED, tia s/s but negative work up in ED. see note    Past Surgical History:  Procedure Laterality Date  . ABDOMINAL HYSTERECTOMY  Partial  . CARPAL TUNNEL RELEASE Right   . cold knife cone     for dysplasia  . COLONOSCOPY     last 11-2008 w/patterson, hems only   . KNEE ARTHROSCOPY WITH MEDIAL MENISECTOMY Right 03/28/2016   Procedure: RIGHT KNEE ARTHROSCOPY CHONDROPLASTY WITH  MEDIAL MENISCECTOMY;  Surgeon: [name], MD;  Location: [place];  Service: Orthopedics;  Laterality: Right;  . TRIGGER FINGER RELEASE     thumb, middle finger   . TUBAL LIGATION     Social History   Socioeconomic History  . Marital status: Married    Spouse name: Not on file  . Number of children: 1  . Years of education: Not on file  . Highest education level: Not on file  Occupational History  . Occupation: Retired  Tobacco Use  . Smoking status: Never Smoker  . Smokeless tobacco: Never Used  Substance and Sexual Activity  . Alcohol use: Yes    Alcohol/week: 1.0 standard drinks    Types: 1 Glasses of wine per week    Comment: occasionally  . Drug use: No  . Sexual activity: Never  Other Topics Concern  . Not on file  Social History Narrative  . Not on file   Social Determinants of Health   Financial Resource Strain:   . Difficulty of Paying Living Expenses: Not on file  Food Insecurity:   . Worried About Charity fundraiser in the Last Year: Not on file  . Ran Out of Food in the Last Year: Not on file  Transportation Needs:   . Lack of Transportation (Medical): Not on file  . Lack of Transportation (  Non-Medical): Not on file  Physical Activity:   . Days of Exercise per Week: Not on file  . Minutes of Exercise per Session: Not on file  Stress:   . Feeling of Stress : Not on file  Social Connections:   . Frequency of Communication with Friends and Family: Not on file  . Frequency of Social Gatherings with Friends and Family: Not on file  . Attends Religious Services: Not on file  . Active Member of Clubs or Organizations: Not on file  . Attends Archivist Meetings: Not on file  . Marital Status: Not on file   Allergies  Allergen Reactions  . Sulfa Antibiotics Rash   Family History  Problem Relation Age of Onset  . Colon cancer Father   . Stomach cancer Father   . Diabetes Mother   . Hypertension Mother   . CVA Mother     . Kidney disease Mother   . Aneurysm Mother   . Colon cancer Maternal Uncle   . Breast cancer Maternal Aunt   . Kidney disease Maternal Aunt   . Heart disease Brother   . Throat cancer Maternal Uncle   . Rectal cancer Neg Hx      Current Outpatient Medications (Cardiovascular):  .  rosuvastatin (CRESTOR) 5 MG tablet, Take 5 mg by mouth daily. Marland Kitchen  triamterene-hydrochlorothiazide (MAXZIDE-25) 37.5-25 MG per tablet, Take 1 tablet by mouth daily.   Current Outpatient Medications (Analgesics):  .  aspirin 81 MG chewable tablet, Chew 81 mg by mouth daily. .  naproxen sodium (ANAPROX) 220 MG tablet, Take 220 mg by mouth 2 (two) times daily with a meal.   Current Outpatient Medications (Other):  .  cholecalciferol (VITAMIN D) 1000 UNITS tablet, Take 2,000 Units by mouth daily. .  cyclobenzaprine (FLEXERIL) 10 MG tablet, Take 10 mg by mouth 3 (three) times daily as needed. For headache .  Diclofenac Sodium (PENNSAID) 2 % SOLN, Place onto the skin. .  Multiple Vitamin (MULTIVITAMIN WITH MINERALS) TABS, Take 1 tablet by mouth daily.    Past medical history, social, surgical and family history all reviewed in electronic medical record.  No pertanent information unless stated regarding to the chief complaint.   Review of Systems:  No headache, visual changes, nausea, vomiting, diarrhea, constipation, dizziness, abdominal pain, skin rash, fevers, chills, night sweats, weight loss, swollen lymph nodes, , chest pain, shortness of breath, mood changes.  Positive muscle aches and body aches  Objective  Blood pressure 110/72, pulse 96, height 5\' 2"  (1.575 m), weight 168 lb (76.2 kg), SpO2 97 %.    General: No apparent distress alert and oriented x3 mood and affect normal, dressed appropriately.  HEENT: Pupils equal, extraocular movements intact  Respiratory: Patient's speak in full sentences and does not appear short of breath  Cardiovascular: No lower extremity edema, non tender, no erythema   Skin: Warm dry intact with no signs of infection or rash on extremities or on axial skeleton.  Abdomen: Soft nontender  Neuro: Cranial nerves II through XII are intact, neurovascularly intact in all extremities with 2+ DTRs and 2+ pulses.  Lymph: No lymphadenopathy of posterior or anterior cervical chain or axillae bilaterally.  Gait n antalgic MSK:  tender with full range of motion and good stability and symmetric strength and tone of shoulders, elbows, wrist, hip, and ankles bilaterally.   Knee: Bilateral Varus deformity noted. Large thigh to calf ratio.  Tender to palpation over medial and PF joint line.  ROM full in  flexion and extension and lower leg rotation. instability with valgus force.  painful patellar compression. Patellar glide with moderate crepitus. Patellar and quadriceps tendons unremarkable. Hamstring and quadriceps strength is normal. C After informed written and verbal consent, patient was seated on exam table. Right knee was prepped with alcohol swab and utilizing anterolateral approach, patient's right knee space was injected with 4:1  marcaine 0.5%: Kenalog 40mg /dL. Patient tolerated the procedure well without immediate complications.  After informed written and verbal consent, patient was seated on exam table. Left knee was prepped with alcohol swab and utilizing anterolateral approach, patient's left knee space was injected with 4:1  marcaine 0.5%: Kenalog 40mg /dL. Patient tolerated the procedure well without immediate complications.    Impression and Recommendations:     This case required medical decision making of moderate complexity. The above documentation has been reviewed and is accurate and complete Lyndal Pulley, DO       Note: This dictation was prepared with Dragon dictation along with smaller phrase technology. Any transcriptional errors that result from this process are unintentional.

## 2019-09-17 ENCOUNTER — Ambulatory Visit (INDEPENDENT_AMBULATORY_CARE_PROVIDER_SITE_OTHER): Payer: Federal, State, Local not specified - PPO | Admitting: Family Medicine

## 2019-09-17 ENCOUNTER — Encounter: Payer: Self-pay | Admitting: Family Medicine

## 2019-09-17 ENCOUNTER — Other Ambulatory Visit: Payer: Self-pay

## 2019-09-17 VITALS — BP 110/72 | HR 96 | Ht 62.0 in | Wt 168.0 lb

## 2019-09-17 DIAGNOSIS — M170 Bilateral primary osteoarthritis of knee: Secondary | ICD-10-CM | POA: Diagnosis not present

## 2019-09-17 NOTE — Assessment & Plan Note (Deleted)
Decision today to treat with OMT was based on Physical Exam  After verbal consent patient was treated with HVLA, ME, FPR techniques in cervical, thoracic, rib lumbar and sacral areas  Patient tolerated the procedure well with improvement in symptoms  Patient given exercises, stretches and lifestyle modifications  See medications in patient instructions if given  Patient will follow up in 4-8 weeks 

## 2019-09-17 NOTE — Assessment & Plan Note (Signed)
Patient had repeat injection again.  Patient wanted to hold on any type of viscosupplementation at the moment.  Has been responding fairly well to the steroid injections.  Patient will do these and follow-up with me again in 10 weeks.

## 2019-09-17 NOTE — Patient Instructions (Addendum)
  [address]st [address] [place], [place] [address] Phone [phone]  Injected both knees today See me again in 10 weeks

## 2019-10-08 DIAGNOSIS — Z20822 Contact with and (suspected) exposure to covid-19: Secondary | ICD-10-CM | POA: Diagnosis not present

## 2019-11-07 DIAGNOSIS — R000 Tachycardia, unspecified: Secondary | ICD-10-CM | POA: Diagnosis not present

## 2019-11-07 DIAGNOSIS — Z20822 Contact with and (suspected) exposure to covid-19: Secondary | ICD-10-CM | POA: Diagnosis not present

## 2019-11-10 ENCOUNTER — Ambulatory Visit: Payer: Federal, State, Local not specified - PPO | Admitting: Family Medicine

## 2019-11-10 ENCOUNTER — Other Ambulatory Visit: Payer: Self-pay

## 2019-11-10 NOTE — Progress Notes (Deleted)
[place] Phone: [phone] Subjective:    I'm seeing this patient by the request  of:  Kelton Pillar, MD  CC: knee pain f/u   [id number]  Dawn [name] is a 64 y.o. female coming in with complaint of ***  Onset-  Location Duration-  Character- Aggravating factors- Reliving factors-  Therapies tried-  Severity-     Past Medical History:  Diagnosis Date  . Abnormal glucose   . Allergic rhinitis   . Anxiety   . Arthritis   . Carpal tunnel syndrome    right  . Cervical dysplasia    1996  . Endometriosis   . Fibroids   . Hemorrhoids   . High cholesterol   . Hyperlipemia   . Hypertension   . Migraine   . Neck pain   . Stroke [facility]) 2014   TIA  . Transient cerebral ischemia    10-22-12 ED, tia s/s but negative work up in ED. see note    Past Surgical History:  Procedure Laterality Date  . ABDOMINAL HYSTERECTOMY  Partial  . CARPAL TUNNEL RELEASE Right   . cold knife cone     for dysplasia  . COLONOSCOPY     last 11-2008 w/patterson, hems only   . KNEE ARTHROSCOPY WITH MEDIAL MENISECTOMY Right 03/28/2016   Procedure: RIGHT KNEE ARTHROSCOPY CHONDROPLASTY WITH MEDIAL MENISCECTOMY;  Surgeon: [name], MD;  Location: [place];  Service: Orthopedics;  Laterality: Right;  . TRIGGER FINGER RELEASE     thumb, middle finger   . TUBAL LIGATION     Social History   Socioeconomic History  . Marital status: Married    Spouse name: Not on file  . Number of children: 1  . Years of education: Not on file  . Highest education level: Not on file  Occupational History  . Occupation: Retired  Tobacco Use  . Smoking status: Never Smoker  . Smokeless tobacco: Never Used  Substance and Sexual Activity  . Alcohol use: Yes    Alcohol/week: 1.0 standard drinks    Types: 1 Glasses of wine per week    Comment: occasionally  . Drug use: No  . Sexual activity: Never  Other  Topics Concern  . Not on file  Social History Narrative  . Not on file   Social Determinants of Health   Financial Resource Strain:   . Difficulty of Paying Living Expenses: Not on file  Food Insecurity:   . Worried About Charity fundraiser in the Last Year: Not on file  . Ran Out of Food in the Last Year: Not on file  Transportation Needs:   . Lack of Transportation (Medical): Not on file  . Lack of Transportation (Non-Medical): Not on file  Physical Activity:   . Days of Exercise per Week: Not on file  . Minutes of Exercise per Session: Not on file  Stress:   . Feeling of Stress : Not on file  Social Connections:   . Frequency of Communication with Friends and Family: Not on file  . Frequency of Social Gatherings with Friends and Family: Not on file  . Attends Religious Services: Not on file  . Active Member of Clubs or Organizations: Not on file  . Attends Archivist Meetings: Not on file  . Marital Status: Not on file   Allergies  Allergen Reactions  . Sulfa Antibiotics Rash   Family History  Problem Relation Age of Onset  . Colon cancer Father   . Stomach cancer Father   . Diabetes Mother   . Hypertension Mother   . CVA Mother   . Kidney disease Mother   . Aneurysm Mother   . Colon cancer Maternal Uncle   . Breast cancer Maternal Aunt   . Kidney disease Maternal Aunt   . Heart disease Brother   . Throat cancer Maternal Uncle   . Rectal cancer Neg Hx      Current Outpatient Medications (Cardiovascular):  .  rosuvastatin (CRESTOR) 5 MG tablet, Take 5 mg by mouth daily. Marland Kitchen  triamterene-hydrochlorothiazide (MAXZIDE-25) 37.5-25 MG per tablet, Take 1 tablet by mouth daily.   Current Outpatient Medications (Analgesics):  .  aspirin 81 MG chewable tablet, Chew 81 mg by mouth daily. .  naproxen sodium (ANAPROX) 220 MG tablet, Take 220 mg by mouth 2 (two) times daily with a meal.   Current Outpatient Medications (Other):  .  cholecalciferol (VITAMIN  D) 1000 UNITS tablet, Take 2,000 Units by mouth daily. .  cyclobenzaprine (FLEXERIL) 10 MG tablet, Take 10 mg by mouth 3 (three) times daily as needed. For headache .  Diclofenac Sodium (PENNSAID) 2 % SOLN, Place onto the skin. .  Multiple Vitamin (MULTIVITAMIN WITH MINERALS) TABS, Take 1 tablet by mouth daily.   Reviewed prior external information including notes and imaging from  primary care provider As well as notes that were available from care everywhere and other healthcare systems.  Past medical history, social, surgical and family history all reviewed in electronic medical record.  No pertanent information unless stated regarding to the chief complaint.   Review of Systems:  No headache, visual changes, nausea, vomiting, diarrhea, constipation, dizziness, abdominal pain, skin rash, fevers, chills, night sweats, weight loss, swollen lymph nodes, body aches, joint swelling, chest pain, shortness of breath, mood changes. POSITIVE muscle aches  Objective  There were no vitals taken for this visit.   General: No apparent distress alert and oriented x3 mood and affect normal, dressed appropriately.  HEENT: Pupils equal, extraocular movements intact  Respiratory: Patient's speak in full sentences and does not appear short of breath  Cardiovascular: No lower extremity edema, non tender, no erythema  Skin: Warm dry intact with no signs of infection or rash on extremities or on axial skeleton.  Abdomen: Soft nontender  Neuro: Cranial nerves II through XII are intact, neurovascularly intact in all extremities with 2+ DTRs and 2+ pulses.  Lymph: No lymphadenopathy of posterior or anterior cervical chain or axillae bilaterally.  Gait normal with good balance and coordination.  MSK:  Non tender with full range of motion and good stability and symmetric strength and tone of shoulders, elbows, wrist, hip and ankles bilaterally.  Knee: Bilateral valgus deformity noted. Large thigh to calf ratio.   Tender to palpation over medial and PF joint line.  ROM full in flexion and extension and lower leg rotation. instability with valgus force.  painful patellar compression. Patellar glide with moderate crepitus. Patellar and quadriceps tendons unremarkable. Hamstring and quadriceps strength is normal. Contralateral knee shows   Impression and Recommendations:     This case required medical decision making of moderate complexity. The above documentation has been reviewed and is accurate and complete Lyndal Pulley, DO       Note: This dictation was prepared with Dragon dictation along with smaller phrase technology. Any transcriptional errors that result from this process are unintentional.

## 2019-11-11 DIAGNOSIS — Z23 Encounter for immunization: Secondary | ICD-10-CM | POA: Diagnosis not present

## 2019-11-15 DIAGNOSIS — Z20822 Contact with and (suspected) exposure to covid-19: Secondary | ICD-10-CM | POA: Diagnosis not present

## 2019-11-16 ENCOUNTER — Ambulatory Visit: Payer: Federal, State, Local not specified - PPO | Admitting: Family Medicine

## 2019-11-20 DIAGNOSIS — Z20822 Contact with and (suspected) exposure to covid-19: Secondary | ICD-10-CM | POA: Diagnosis not present

## 2019-11-25 ENCOUNTER — Other Ambulatory Visit: Payer: Self-pay

## 2019-11-25 ENCOUNTER — Encounter: Payer: Self-pay | Admitting: Family Medicine

## 2019-11-25 ENCOUNTER — Ambulatory Visit (INDEPENDENT_AMBULATORY_CARE_PROVIDER_SITE_OTHER): Payer: Federal, State, Local not specified - PPO | Admitting: Family Medicine

## 2019-11-25 DIAGNOSIS — M170 Bilateral primary osteoarthritis of knee: Secondary | ICD-10-CM

## 2019-11-25 NOTE — Assessment & Plan Note (Signed)
Chronic problem with exacerbation.  Will expect to get the approval for the viscosupplementation.  Patient had approval previously last year but we never did it.  We will try again at this moment.  Discussed that icing regimen, home exercise, discussed the possibility of other medical management.  Social determinants of health including recent death of his patient's mother and was the main caregiver.  Patient will follow up with me again in 6 weeks

## 2019-11-25 NOTE — Progress Notes (Signed)
[place] Phone: [phone] Subjective:   Dawn [name], am serving as a scribe for Dr. Hulan Saas. This visit occurred during the SARS-CoV-2 public health emergency.  Safety protocols were in place, including screening questions prior to the visit, additional usage of staff PPE, and extensive cleaning of exam room while observing appropriate contact time as indicated for disinfecting solutions.   I'm seeing this patient by the request  of:  Kelton Pillar, MD  CC: bilateral knee pain   [id number]   09/17/2019 Patient had repeat injection again.  Patient wanted to hold on any type of viscosupplementation at the moment.  Has been responding fairly well to the steroid injections.  Patient will do these and follow-up with me again in 10 weeks.  Update 11/25/2019 Dawn [name] is a 64 y.o. female coming in with complaint of bilateral knee pain. She said that the injections did not help the right knee but did help the left. The pain in her left knee though has become worse than the right.    Past Medical History:  Diagnosis Date  . Abnormal glucose   . Allergic rhinitis   . Anxiety   . Arthritis   . Carpal tunnel syndrome    right  . Cervical dysplasia    1996  . Endometriosis   . Fibroids   . Hemorrhoids   . High cholesterol   . Hyperlipemia   . Hypertension   . Migraine   . Neck pain   . Stroke [facility]) 2014   TIA  . Transient cerebral ischemia    10-22-12 ED, tia s/s but negative work up in ED. see note    Past Surgical History:  Procedure Laterality Date  . ABDOMINAL HYSTERECTOMY  Partial  . CARPAL TUNNEL RELEASE Right   . cold knife cone     for dysplasia  . COLONOSCOPY     last 11-2008 w/patterson, hems only   . KNEE ARTHROSCOPY WITH MEDIAL MENISECTOMY Right 03/28/2016   Procedure: RIGHT KNEE ARTHROSCOPY CHONDROPLASTY WITH MEDIAL MENISCECTOMY;  Surgeon: [name], MD;  Location:  [place];  Service: Orthopedics;  Laterality: Right;  . TRIGGER FINGER RELEASE     thumb, middle finger   . TUBAL LIGATION     Social History   Socioeconomic History  . Marital status: Married    Spouse name: Not on file  . Number of children: 1  . Years of education: Not on file  . Highest education level: Not on file  Occupational History  . Occupation: Retired  Tobacco Use  . Smoking status: Never Smoker  . Smokeless tobacco: Never Used  Substance and Sexual Activity  . Alcohol use: Yes    Alcohol/week: 1.0 standard drinks    Types: 1 Glasses of wine per week    Comment: occasionally  . Drug use: [name]  . Sexual activity: Never  Other Topics Concern  . Not on file  Social History Narrative  . Not on file   Social Determinants of Health   Financial Resource Strain:   . Difficulty of Paying Living Expenses: Not on file  Food Insecurity:   . Worried About Charity fundraiser in the Last Year: Not on file  . Ran Out of Food in the Last Year: Not on file  Transportation Needs:   . Lack of Transportation (Medical): Not on file  . Lack of Transportation (Non-Medical): Not on file  Physical Activity:   . Days of Exercise per Week: Not on file  . Minutes of Exercise per Session: Not on file  Stress:   . Feeling of Stress : Not on file  Social Connections:   . Frequency of Communication with Friends and Family: Not on file  . Frequency of Social Gatherings with Friends and Family: Not on file  . Attends Religious Services: Not on file  . Active Member of Clubs or Organizations: Not on file  . Attends Archivist Meetings: Not on file  . Marital Status: Not on file   Allergies  Allergen Reactions  . Sulfa Antibiotics Rash   Family History  Problem Relation Age of Onset  . Colon cancer Father   . Stomach cancer Father   . Diabetes Mother   . Hypertension Mother   . CVA Mother   . Kidney disease Mother   . Aneurysm Mother   . Colon cancer  Maternal Uncle   . Breast cancer Maternal Aunt   . Kidney disease Maternal Aunt   . Heart disease Brother   . Throat cancer Maternal Uncle   . Rectal cancer Neg Hx      Current Outpatient Medications (Cardiovascular):  .  rosuvastatin (CRESTOR) 5 MG tablet, Take 5 mg by mouth daily. Marland Kitchen  triamterene-hydrochlorothiazide (MAXZIDE-25) 37.5-25 MG per tablet, Take 1 tablet by mouth daily.   Current Outpatient Medications (Analgesics):  .  aspirin 81 MG chewable tablet, Chew 81 mg by mouth daily. .  naproxen sodium (ANAPROX) 220 MG tablet, Take 220 mg by mouth 2 (two) times daily with a meal.   Current Outpatient Medications (Other):  .  cholecalciferol (VITAMIN D) 1000 UNITS tablet, Take 2,000 Units by mouth daily. .  cyclobenzaprine (FLEXERIL) 10 MG tablet, Take 10 mg by mouth 3 (three) times daily as needed. For headache .  Diclofenac Sodium (PENNSAID) 2 % SOLN, Place onto the skin. .  Multiple Vitamin (MULTIVITAMIN WITH MINERALS) TABS, Take 1 tablet by mouth daily.   Reviewed prior external information including notes and imaging from  primary care provider As well as notes that were available from care everywhere and other healthcare systems.  Past medical history, social, surgical and family history all reviewed in electronic medical record.  [name] pertanent information unless stated regarding to the chief complaint.   Review of Systems:  [name] headache, visual changes, nausea, vomiting, diarrhea, constipation, dizziness, abdominal pain, skin rash, fevers, chills, night sweats, weight loss, swollen lymph nodes, body aches, joint swelling, chest pain, shortness of breath, mood changes. POSITIVE muscle aches  Objective  Blood pressure 128/90, height 5\' 2"  (1.575 m), weight 168 lb (76.2 kg).   General: [name] apparent distress alert and oriented x3 mood and affect normal, dressed appropriately.  HEENT: Pupils equal, extraocular movements intact  Respiratory: Patient's speak in full sentences  and does not appear short of breath  Cardiovascular: Trace lower extremity edema, non tender, [name] erythema  Skin: Warm dry intact with [name] signs of infection or rash on extremities or on axial skeleton.  Abdomen: Soft nontender  Neuro: Cranial nerves II through XII are intact, neurovascularly intact in all extremities with 2+ DTRs and 2+ pulses.  Lymph: [name] lymphadenopathy of posterior or anterior cervical chain or axillae bilaterally.  Gait antalgic.  MSK:   Knee: Bilateral valgus deformity noted. Large thigh to calf ratio.  Tender to palpation over medial and PF joint line.  ROM full in flexion and extension and lower leg rotation. instability with  valgus force.  painful patellar compression. Patellar glide with moderate crepitus. Patellar and quadriceps tendons unremarkable. Hamstring and quadriceps strength is normal.  After informed written and verbal consent, patient was seated on exam table. Right knee was prepped with alcohol swab and utilizing anterolateral approach, patient's right knee space was injected with 4:1  marcaine 0.5%: Kenalog 40mg /dL. Patient tolerated the procedure well without immediate complications.  After informed written and verbal consent, patient was seated on exam table. Left knee was prepped with alcohol swab and utilizing anterolateral approach, patient's left knee space was injected with 4:1  marcaine 0.5%: Kenalog 40mg /dL. Patient tolerated the procedure well without immediate complications.    Impression and Recommendations:     This case required medical decision making of moderate complexity. The above documentation has been reviewed and is accurate and complete Lyndal Pulley, DO       Note: This dictation was prepared with Dragon dictation along with smaller phrase technology. Any transcriptional errors that result from this process are unintentional.

## 2019-11-25 NOTE — Patient Instructions (Signed)
Will get gel approval See me again in 6 weeks

## 2019-12-06 ENCOUNTER — Encounter: Payer: Self-pay | Admitting: Internal Medicine

## 2019-12-09 DIAGNOSIS — Z23 Encounter for immunization: Secondary | ICD-10-CM | POA: Diagnosis not present

## 2019-12-22 ENCOUNTER — Other Ambulatory Visit: Payer: Self-pay

## 2019-12-22 ENCOUNTER — Ambulatory Visit (AMBULATORY_SURGERY_CENTER): Payer: Self-pay | Admitting: *Deleted

## 2019-12-22 VITALS — Temp 96.7°F | Ht 62.0 in | Wt 169.2 lb

## 2019-12-22 DIAGNOSIS — Z800 Family history of malignant neoplasm of digestive organs: Secondary | ICD-10-CM

## 2019-12-22 DIAGNOSIS — Z86010 Personal history of colonic polyps: Secondary | ICD-10-CM

## 2019-12-22 MED ORDER — SUPREP BOWEL PREP KIT 17.5-3.13-1.6 GM/177ML PO SOLN: ORAL | 0 refills | Status: AC

## 2019-12-22 NOTE — Progress Notes (Signed)
2nd covid vaccine 12-09-19  Pt is aware that care partner will wait in the car during procedure; if they feel like they will be too hot or cold to wait in the car; they may wait in the 4 th floor lobby. Patient is aware to bring only one care partner. We want them to wear a mask (we do not have any that we can provide them), practice social distancing, and we will check their temperatures when they get here.  I did remind the patient that their care partner needs to stay in the parking lot the entire time and have a cell phone available, we will call them when the pt is ready for discharge. Patient will wear mask into building.  2 day Dawn [name] prep given- she states she has chronic constipation   No trouble with anesthesia, difficulty with intubation or hx/fam hx of malignant hyperthermia per pt   No egg or soy allergy  No home oxygen use   No medications for weight loss taken  emmi information given  Suprep coupon given and code put into RX

## 2019-12-28 DIAGNOSIS — E7800 Pure hypercholesterolemia, unspecified: Secondary | ICD-10-CM | POA: Diagnosis not present

## 2019-12-28 DIAGNOSIS — Z0000 Encounter for general adult medical examination without abnormal findings: Secondary | ICD-10-CM | POA: Diagnosis not present

## 2019-12-28 DIAGNOSIS — R7303 Prediabetes: Secondary | ICD-10-CM | POA: Diagnosis not present

## 2019-12-28 DIAGNOSIS — I10 Essential (primary) hypertension: Secondary | ICD-10-CM | POA: Diagnosis not present

## 2020-01-05 ENCOUNTER — Ambulatory Visit (AMBULATORY_SURGERY_CENTER): Payer: Federal, State, Local not specified - PPO | Admitting: Internal Medicine

## 2020-01-05 ENCOUNTER — Encounter: Payer: Self-pay | Admitting: Internal Medicine

## 2020-01-05 ENCOUNTER — Other Ambulatory Visit: Payer: Self-pay

## 2020-01-05 VITALS — BP 137/96 | HR 82 | Temp 96.8°F | Resp 17 | Ht 62.0 in | Wt 169.2 lb

## 2020-01-05 DIAGNOSIS — D122 Benign neoplasm of ascending colon: Secondary | ICD-10-CM

## 2020-01-05 DIAGNOSIS — Z1211 Encounter for screening for malignant neoplasm of colon: Secondary | ICD-10-CM | POA: Diagnosis not present

## 2020-01-05 DIAGNOSIS — Z86010 Personal history of colonic polyps: Secondary | ICD-10-CM

## 2020-01-05 DIAGNOSIS — Z800 Family history of malignant neoplasm of digestive organs: Secondary | ICD-10-CM | POA: Diagnosis not present

## 2020-01-05 MED ORDER — SODIUM CHLORIDE 0.9 % IV SOLN: 500.0000 mL | INTRAVENOUS | Status: DC

## 2020-01-05 NOTE — Progress Notes (Signed)
Report to PACU, RN, vss, BBS= Clear.  

## 2020-01-05 NOTE — Patient Instructions (Signed)
Handouts provided on polyps and diverticulosis.  ? ?YOU HAD AN ENDOSCOPIC PROCEDURE TODAY AT THE [facility]:   Refer to the procedure report that was given to you for any specific questions about what was found during the examination.  If the procedure report does not answer your questions, please call your gastroenterologist to clarify.  If you requested that your care partner not be given the details of your procedure findings, then the procedure report has been included in a sealed envelope for you to review at your convenience later. ? ?YOU SHOULD EXPECT: Some feelings of bloating in the abdomen. Passage of more gas than usual.  Walking can help get rid of the air that was put into your GI tract during the procedure and reduce the bloating. If you had a lower endoscopy (such as a colonoscopy or flexible sigmoidoscopy) you may notice spotting of blood in your stool or on the toilet paper. If you underwent a bowel prep for your procedure, you may not have a normal bowel movement for a few days. ? ?Please Note:  You might notice some irritation and congestion in your nose or some drainage.  This is from the oxygen used during your procedure.  There is no need for concern and it should clear up in a day or so. ? ?SYMPTOMS TO REPORT IMMEDIATELY: ? ?Following lower endoscopy (colonoscopy or flexible sigmoidoscopy): ? Excessive amounts of blood in the stool ? Significant tenderness or worsening of abdominal pains ? Swelling of the abdomen that is new, acute ? Fever of 100?F or higher ? ?For urgent or emergent issues, a gastroenterologist can be reached at any hour by calling [phone]. ?Do not use MyChart messaging for urgent concerns.  ? ? ?DIET:  We do recommend a small meal at first, but then you may proceed to your regular diet.  Drink plenty of fluids but you should avoid alcoholic beverages for 24 hours. ? ?ACTIVITY:  You should plan to take it easy for the rest of today and you should NOT  DRIVE or use heavy machinery until tomorrow (because of the sedation medicines used during the test).   ? ?FOLLOW UP: ?Our staff will call the number listed on your records 48-72 hours following your procedure to check on you and address any questions or concerns that you may have regarding the information given to you following your procedure. If we do not reach you, we will leave a message.  We will attempt to reach you two times.  During this call, we will ask if you have developed any symptoms of COVID 19. If you develop any symptoms (ie: fever, flu-like symptoms, shortness of breath, cough etc.) before then, please call [phone].  If you test positive for Covid 19 in the 2 weeks post procedure, please call and report this information to us.   ? ?If any biopsies were taken you will be contacted by phone or by letter within the next 1-3 weeks.  Please call us at [phone] if you have not heard about the biopsies in 3 weeks.  ? ? ?SIGNATURES/CONFIDENTIALITY: ?You and/or your care partner have signed paperwork which will be entered into your electronic medical record.  These signatures attest to the fact that that the information above on your After Visit Summary has been reviewed and is understood.  Full responsibility of the confidentiality of this discharge information lies with you and/or your care-partner. ? ?

## 2020-01-05 NOTE — Op Note (Signed)
[place] Patient Name: Dawn [name] Procedure Date: 01/05/2020 10:11 AM MRN: [id number] Endoscopist: Docia Chuck. Henrene Pastor , MD Age: 64 Referring MD:  Date of Birth: 02-06-1956 Gender: Female Account #: [id number] Procedure:                Colonoscopy with cold snare polypectomy x 1 Indications:              High risk colon cancer surveillance: Personal                            history of non-advanced adenomas. Family history of                            colon cancer in father and uncle (early 29s).                            Previous examinations 2010 and 2016 Medicines:                Monitored Anesthesia Care Procedure:                Pre-Anesthesia Assessment:                           - Prior to the procedure, a History and Physical                            was performed, and patient medications and                            allergies were reviewed. The patient's tolerance of                            previous anesthesia was also reviewed. The risks                            and benefits of the procedure and the sedation                            options and risks were discussed with the patient.                            All questions were answered, and informed consent                            was obtained. Prior Anticoagulants: The patient has                            taken no previous anticoagulant or antiplatelet                            agents. ASA Grade Assessment: II - A patient with                            mild systemic disease. After reviewing the risks  and benefits, the patient was deemed in                            satisfactory condition to undergo the procedure.                           After obtaining informed consent, the colonoscope                            was passed under direct vision. Throughout the                            procedure, the patient's blood pressure, pulse, and   oxygen saturations were monitored continuously. The                            Colonoscope was introduced through the anus and                            advanced to the the cecum, identified by                            appendiceal orifice and ileocecal valve. The                            ileocecal valve, appendiceal orifice, and rectum                            were photographed. The quality of the bowel                            preparation was good. The colonoscopy was performed                            without difficulty. The patient tolerated the                            procedure well. The bowel preparation used was                            SUPREP via split dose instruction. Scope In: 10:23:08 AM Scope Out: 10:34:58 AM Scope Withdrawal Time: 0 hours 9 minutes 18 seconds  Total Procedure Duration: 0 hours 11 minutes 50 seconds  Findings:                 A 2 mm polyp was found in the ascending colon. The                            polyp was removed with a cold snare. Resection and                            retrieval were complete.                           Multiple diverticula were  found in the sigmoid                            colon.                           The exam was otherwise without abnormality on                            direct and retroflexion views. Internal hemorrhoids                            present. Complications:            No immediate complications. Estimated blood loss:                            None. Estimated Blood Loss:     Estimated blood loss: none. Impression:               - One 2 mm polyp in the ascending colon, removed                            with a cold snare. Resected and retrieved.                           - Diverticulosis in the sigmoid colon.                           - The examination was otherwise normal on direct                            and retroflexion views. Recommendation:           - Repeat colonoscopy in 5 years for  surveillance.                           - Patient has a contact number available for                            emergencies. The signs and symptoms of potential                            delayed complications were discussed with the                            patient. Return to normal activities tomorrow.                            Written discharge instructions were provided to the                            patient.                           - Resume previous diet.                           -  Continue present medications.                           - Await pathology results. Docia Chuck. Henrene Pastor, MD 01/05/2020 10:39:37 AM This report has been signed electronically.

## 2020-01-05 NOTE — Progress Notes (Signed)
[facility] - [facility] [place]   Pt's states no medical or surgical changes since previsit or office visit.

## 2020-01-06 ENCOUNTER — Ambulatory Visit: Payer: Federal, State, Local not specified - PPO | Admitting: Family Medicine

## 2020-01-07 ENCOUNTER — Telehealth: Payer: Self-pay

## 2020-01-07 ENCOUNTER — Telehealth: Payer: Self-pay | Admitting: *Deleted

## 2020-01-07 NOTE — Telephone Encounter (Signed)
1. Have you developed a fever since your procedure? no  2.   Have you had an respiratory symptoms (SOB or cough) since your procedure? no  3.   Have you tested positive for COVID 19 since your procedure no  4.   Have you had any family members/close contacts diagnosed with the COVID 19 since your procedure?  no   If yes to any of these questions please route to Joylene John, RN and Erenest Rasher, RN Follow up Call-  Call back number 01/05/2020  Post procedure Call Back phone  # [phone] cell  Permission to leave phone message Yes  Some recent data might be hidden     Patient questions:  Do you have a fever, pain , or abdominal swelling? No. Pain Score  0 *  Have you tolerated food without any problems? Yes.    Have you been able to return to your normal activities? Yes.    Do you have any questions about your discharge instructions: Diet   No. Medications  No. Follow up visit  No.  Do you have questions or concerns about your Care? No.  Actions: * If pain score is 4 or above: No action needed, pain <4.

## 2020-01-07 NOTE — Telephone Encounter (Signed)
  Follow up Call-  Call back number 01/05/2020  Post procedure Call Back phone  # [phone] cell  Permission to leave phone message Yes  Some recent data might be hidden     Patient questions:  Do you have a fever, pain , or abdominal swelling? No. Pain Score  0 *  Have you tolerated food without any problems? Yes.    Have you been able to return to your normal activities? Yes.    Do you have any questions about your discharge instructions: Diet   No. Medications  No. Follow up visit  No.  Do you have questions or concerns about your Care? No.  Actions: * If pain score is 4 or above: No action needed, pain <4.  1. Have you developed a fever since your procedure? no  2.   Have you had an respiratory symptoms (SOB or cough) since your procedure? no  3.   Have you tested positive for COVID 19 since your procedure no  4.   Have you had any family members/close contacts diagnosed with the COVID 19 since your procedure?  no   If yes to any of these questions please route to Joylene John, RN and Erenest Rasher, RN

## 2020-01-10 ENCOUNTER — Encounter: Payer: Self-pay | Admitting: Internal Medicine

## 2020-01-20 ENCOUNTER — Ambulatory Visit: Payer: Federal, State, Local not specified - PPO | Admitting: Family Medicine

## 2020-02-07 DIAGNOSIS — I10 Essential (primary) hypertension: Secondary | ICD-10-CM | POA: Diagnosis not present

## 2020-02-07 DIAGNOSIS — J019 Acute sinusitis, unspecified: Secondary | ICD-10-CM | POA: Diagnosis not present

## 2020-02-07 DIAGNOSIS — J301 Allergic rhinitis due to pollen: Secondary | ICD-10-CM | POA: Diagnosis not present

## 2020-02-17 ENCOUNTER — Other Ambulatory Visit: Payer: Self-pay

## 2020-02-17 ENCOUNTER — Encounter: Payer: Self-pay | Admitting: Family Medicine

## 2020-02-17 ENCOUNTER — Ambulatory Visit (INDEPENDENT_AMBULATORY_CARE_PROVIDER_SITE_OTHER): Payer: Federal, State, Local not specified - PPO | Admitting: Family Medicine

## 2020-02-17 VITALS — BP 108/70 | HR 96 | Ht 62.0 in | Wt 169.0 lb

## 2020-02-17 DIAGNOSIS — G8929 Other chronic pain: Secondary | ICD-10-CM

## 2020-02-17 DIAGNOSIS — M25562 Pain in left knee: Secondary | ICD-10-CM | POA: Diagnosis not present

## 2020-02-17 DIAGNOSIS — M170 Bilateral primary osteoarthritis of knee: Secondary | ICD-10-CM

## 2020-02-17 DIAGNOSIS — M25561 Pain in right knee: Secondary | ICD-10-CM

## 2020-02-17 NOTE — Patient Instructions (Signed)
Injected left knee today Xray next visit before you come back Brace with activity See me again in 10 weeks

## 2020-02-17 NOTE — Assessment & Plan Note (Signed)
Left knee having acute exacerbation of a chronic problem.  Given home exercises, injection again.  Triple light given at the moment.  Discussed which activities to do which wants to avoid.  Patient will increase activity slowly.  Discussed topical anti-inflammatories, oral anti-inflammatories.  Follow-up again in 10 weeks

## 2020-02-17 NOTE — Progress Notes (Signed)
[place] Phone: [phone] Subjective:   Dawn [name], am serving as a scribe for Dr. Hulan Saas. This visit occurred during the SARS-CoV-2 public health emergency.  Safety protocols were in place, including screening questions prior to the visit, additional usage of staff PPE, and extensive cleaning of exam room while observing appropriate contact time as indicated for disinfecting solutions.   I'm seeing this patient by the request  of:  Kelton Pillar, MD  CC: Knee pain follow-up  [id number]   11/25/2019 Chronic problem with exacerbation.  Will expect to get the approval for the viscosupplementation.  Patient had approval previously last year but we never did it.  We will try again at this moment.  Discussed that icing regimen, home exercise, discussed the possibility of other medical management.  Social determinants of health including recent death of his patient's mother and was the main caregiver.  Patient will follow up with me again in 6 weeks  Update 02/17/2020 Dawn [name] is a 64 y.o. female coming in with complaint of bilateral knee pain. Patient states that she has had some more discomfort recently.  More increasing instability as well.  Denies any numbness or tingling.  Rates the severity of pain is 8 out of 10.  Does not feel that the viscosupplementation is helpful anymore but does feel the steroids do give her some relief.    Past Medical History:  Diagnosis Date  . Abnormal glucose   . Allergic rhinitis   . Allergy   . Anxiety    [name] per pt  . Arthritis    bilateral knees- get cortisone injections  . Asthma    as a child  . Carpal tunnel syndrome    right  . Cervical dysplasia    1996  . Endometriosis   . Fibroids   . Hemorrhoids   . High cholesterol   . Hyperlipemia   . Hypertension   . Migraine   . Neck pain   . Stroke [facility]) 2014   TIA- negative work up in ED 2014  .  Transient cerebral ischemia    10-22-12 ED, tia s/s but negative work up in ED. see note    Past Surgical History:  Procedure Laterality Date  . ABDOMINAL HYSTERECTOMY  Partial  . CARPAL TUNNEL RELEASE Right   . cold knife cone     for dysplasia  . COLONOSCOPY     last 11-2008 w/patterson, hems only   . KNEE ARTHROSCOPY WITH MEDIAL MENISECTOMY Right 03/28/2016   Procedure: RIGHT KNEE ARTHROSCOPY CHONDROPLASTY WITH MEDIAL MENISCECTOMY;  Surgeon: [name], MD;  Location: [facility];  Service: Orthopedics;  Laterality: Right;  . TRIGGER FINGER RELEASE     thumb, middle finger   . TUBAL LIGATION     Social History   Socioeconomic History  . Marital status: Married    Spouse name: Not on file  . Number of children: 1  . Years of education: Not on file  . Highest education level: Not on file  Occupational History  . Occupation: Retired  Tobacco Use  . Smoking status: Never Smoker  . Smokeless tobacco: Never Used  Substance and Sexual Activity  . Alcohol use: Yes    Alcohol/week: 1.0 standard drinks    Types: 1 Glasses of wine per week    Comment: occasionally  . Drug use: [name]  . Sexual activity: Never  Other Topics Concern  .  Not on file  Social History Narrative  . Not on file   Social Determinants of Health   Financial Resource Strain:   . Difficulty of Paying Living Expenses:   Food Insecurity:   . Worried About Charity fundraiser in the Last Year:   . Arboriculturist in the Last Year:   Transportation Needs:   . Film/video editor (Medical):   Marland Kitchen Lack of Transportation (Non-Medical):   Physical Activity:   . Days of Exercise per Week:   . Minutes of Exercise per Session:   Stress:   . Feeling of Stress :   Social Connections:   . Frequency of Communication with Friends and Family:   . Frequency of Social Gatherings with Friends and Family:   . Attends Religious Services:   . Active Member of Clubs or Organizations:   . Attends Theatre manager Meetings:   Marland Kitchen Marital Status:    Allergies  Allergen Reactions  . Sulfa Antibiotics Rash   Family History  Problem Relation Age of Onset  . Colon cancer Father   . Stomach cancer Father   . Diabetes Mother   . Hypertension Mother   . CVA Mother   . Kidney disease Mother   . Aneurysm Mother   . Colon cancer Maternal Uncle   . Breast cancer Maternal Aunt   . Kidney disease Maternal Aunt   . Heart disease Brother   . Throat cancer Maternal Uncle   . Rectal cancer Neg Hx   . Esophageal cancer Neg Hx      Current Outpatient Medications (Cardiovascular):  .  rosuvastatin (CRESTOR) 5 MG tablet, Take 5 mg by mouth daily. Marland Kitchen  triamterene-hydrochlorothiazide (MAXZIDE-25) 37.5-25 MG per tablet, Take 1 tablet by mouth daily.  Current Outpatient Medications (Respiratory):  .  fexofenadine (ALLEGRA) 180 MG tablet, Take 180 mg by mouth daily.  Current Outpatient Medications (Analgesics):  Marland Kitchen  Acetaminophen (TYLENOL PO), Take by mouth as needed. Marland Kitchen  aspirin 81 MG chewable tablet, Chew 81 mg by mouth daily. .  naproxen sodium (ANAPROX) 220 MG tablet, Take 220 mg by mouth 2 (two) times daily with a meal.   Current Outpatient Medications (Other):  .  cholecalciferol (VITAMIN D) 1000 UNITS tablet, Take 2,000 Units by mouth daily. .  cyclobenzaprine (FLEXERIL) 10 MG tablet, Take 10 mg by mouth 3 (three) times daily as needed. For headache .  Diclofenac Sodium (PENNSAID) 2 % SOLN, Place onto the skin. .  Multiple Vitamin (MULTIVITAMIN WITH MINERALS) TABS, Take 1 tablet by mouth daily. .  Na Sulfate-K Sulfate-Mg Sulf (SUPREP BOWEL PREP KIT) 17.5-3.13-1.6 GM/177ML SOLN, Suprep as directed, [name] substitutions .  OVER THE COUNTER MEDICATION, OTC laxative- unsure which kind- uses almost daily   Reviewed prior external information including notes and imaging from  primary care provider As well as notes that were available from care everywhere and other healthcare systems.  Past  medical history, social, surgical and family history all reviewed in electronic medical record.  [name] pertanent information unless stated regarding to the chief complaint.   Review of Systems:  [name] headache, visual changes, nausea, vomiting, diarrhea, constipation, dizziness, abdominal pain, skin rash, fevers, chills, night sweats, weight loss, swollen lymph nodes, body aches, joint swelling, chest pain, shortness of breath, mood changes. POSITIVE muscle aches  Objective  Blood pressure 108/70, pulse 96, height 5' 2" (1.575 m), weight 169 lb (76.7 kg), SpO2 98 %.   General: [name] apparent distress alert and oriented  x3 mood and affect normal, dressed appropriately.  HEENT: Pupils equal, extraocular movements intact  Respiratory: Patient's speak in full sentences and does not appear short of breath  Cardiovascular: [name] lower extremity edema, non tender, [name] erythema  Neuro: Cranial nerves II through XII are intact, neurovascularly intact in all extremities with 2+ DTRs and 2+ pulses.  Gait normal with good balance and coordination.  MSK: Knee: Bilateral valgus deformity noted. Large thigh to calf ratio.  Tender to palpation over medial and PF joint line.  Left greater than right ROM full in flexion and extension and lower leg rotation. instability with valgus force.  Left greater than right painful patellar compression. Patellar glide with moderate crepitus. Patellar and quadriceps tendons unremarkable. Hamstring and quadriceps strength is normal.  After informed written and verbal consent, patient was seated on exam table. Left knee was prepped with alcohol swab and utilizing anterolateral approach, patient's left knee space was injected with 4:1  marcaine 0.5%: Kenalog 45m/dL. Patient tolerated the procedure well without immediate complications.    Impression and Recommendations: Patient was measured and fitted for an off-the-shelf brace. Adjustments were made to brace to ensure proper fit.       This case required medical decision making of moderate complexity. The above documentation has been reviewed and is accurate and complete ZLyndal Pulley DO       Note: This dictation was prepared with Dragon dictation along with smaller phrase technology. Any transcriptional errors that result from this process are unintentional.

## 2020-02-18 ENCOUNTER — Other Ambulatory Visit: Payer: Self-pay | Admitting: Family Medicine

## 2020-02-18 DIAGNOSIS — J301 Allergic rhinitis due to pollen: Secondary | ICD-10-CM | POA: Diagnosis not present

## 2020-02-18 DIAGNOSIS — I10 Essential (primary) hypertension: Secondary | ICD-10-CM | POA: Diagnosis not present

## 2020-02-18 DIAGNOSIS — R519 Headache, unspecified: Secondary | ICD-10-CM | POA: Diagnosis not present

## 2020-02-18 DIAGNOSIS — Z8249 Family history of ischemic heart disease and other diseases of the circulatory system: Secondary | ICD-10-CM

## 2020-02-18 DIAGNOSIS — R413 Other amnesia: Secondary | ICD-10-CM | POA: Diagnosis not present

## 2020-03-01 ENCOUNTER — Ambulatory Visit (INDEPENDENT_AMBULATORY_CARE_PROVIDER_SITE_OTHER): Payer: Federal, State, Local not specified - PPO | Admitting: Otolaryngology

## 2020-03-01 ENCOUNTER — Encounter (INDEPENDENT_AMBULATORY_CARE_PROVIDER_SITE_OTHER): Payer: Self-pay | Admitting: Otolaryngology

## 2020-03-01 ENCOUNTER — Other Ambulatory Visit: Payer: Self-pay

## 2020-03-01 VITALS — Temp 97.2°F

## 2020-03-01 DIAGNOSIS — R519 Headache, unspecified: Secondary | ICD-10-CM | POA: Diagnosis not present

## 2020-03-01 DIAGNOSIS — J342 Deviated nasal septum: Secondary | ICD-10-CM | POA: Diagnosis not present

## 2020-03-01 DIAGNOSIS — H6123 Impacted cerumen, bilateral: Secondary | ICD-10-CM | POA: Diagnosis not present

## 2020-03-01 DIAGNOSIS — J310 Chronic rhinitis: Secondary | ICD-10-CM | POA: Diagnosis not present

## 2020-03-01 DIAGNOSIS — G8929 Other chronic pain: Secondary | ICD-10-CM

## 2020-03-01 NOTE — Progress Notes (Signed)
HPI: Dawn [name] is a 64 y.o. female who presents is referred by Kelton Pillar, MD for evaluation of sinuses.  Patient apparently has been having chronic pressure and headaches more on the left side of her face and behind her left eye.  She has been treated with 3 rounds of antibiotics including Augmentin.  She used to take Allegra-D that seemed to help initially but was taken this every day and had elevated blood pressure and was told to stop the decongestant portion and just take plain Allegra.  She has also been on Flonase.  She describes pressure behind her eyes especially on the left side which is worse at night.  She apparently is scheduled to have a CT scan this weekend. She denies any yellow-green discharge from her nose.. She had a CT scan of her head performed in October 2018 because of right-sided headaches.  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  The ethmoid sinuses sphenoid sinuses and maxillary sinuses were clear. Patient takes medication for hypertension as well as elevated cholesterol but no history of heart disease.  Past Medical History:  Diagnosis Date  . Abnormal glucose   . Allergic rhinitis   . Allergy   . Anxiety    no per pt  . Arthritis    bilateral knees- get cortisone injections  . Asthma    as a child  . Carpal tunnel syndrome    right  . Cervical dysplasia    1996  . Endometriosis   . Fibroids   . Hemorrhoids   . High cholesterol   . Hyperlipemia   . Hypertension   . Migraine   . Neck pain   . Stroke [facility]) 2014   TIA- negative work up in ED 2014  . Transient cerebral ischemia    10-22-12 ED, tia s/s but negative work up in ED. see note    Past Surgical History:  Procedure Laterality Date  . ABDOMINAL HYSTERECTOMY  Partial  . CARPAL TUNNEL RELEASE Right   . cold knife cone     for dysplasia  . COLONOSCOPY     last 11-2008 w/patterson, hems only   . KNEE ARTHROSCOPY WITH  MEDIAL MENISECTOMY Right 03/28/2016   Procedure: RIGHT KNEE ARTHROSCOPY CHONDROPLASTY WITH MEDIAL MENISCECTOMY;  Surgeon: [name], MD;  Location: [facility];  Service: Orthopedics;  Laterality: Right;  . TRIGGER FINGER RELEASE     thumb, middle finger   . TUBAL LIGATION     Social History   Socioeconomic History  . Marital status: Married    Spouse name: Not on file  . Number of children: 1  . Years of education: Not on file  . Highest education level: Not on file  Occupational History  . Occupation: Retired  Tobacco Use  . Smoking status: Never Smoker  . Smokeless tobacco: Never Used  Substance and Sexual Activity  . Alcohol use: Yes    Alcohol/week: 1.0 standard drinks    Types: 1 Glasses of wine per week    Comment: occasionally  . Drug use: No  . Sexual activity: Never  Other Topics Concern  . Not on file  Social History Narrative  . Not on file   Social Determinants of Health   Financial Resource Strain:   . Difficulty of Paying Living Expenses:   Food Insecurity:   . Worried About Charity fundraiser in the Last Year:   .  Ran Out of Food in the Last Year:   Transportation Needs:   . Film/video editor (Medical):   Marland Kitchen Lack of Transportation (Non-Medical):   Physical Activity:   . Days of Exercise per Week:   . Minutes of Exercise per Session:   Stress:   . Feeling of Stress :   Social Connections:   . Frequency of Communication with Friends and Family:   . Frequency of Social Gatherings with Friends and Family:   . Attends Religious Services:   . Active Member of Clubs or Organizations:   . Attends Archivist Meetings:   Marland Kitchen Marital Status:    Family History  Problem Relation Age of Onset  . Colon cancer Father   . Stomach cancer Father   . Diabetes Mother   . Hypertension Mother   . CVA Mother   . Kidney disease Mother   . Aneurysm Mother   . Colon cancer Maternal Uncle   . Breast cancer Maternal Aunt   . Kidney  disease Maternal Aunt   . Heart disease Brother   . Throat cancer Maternal Uncle   . Rectal cancer Neg Hx   . Esophageal cancer Neg Hx    Allergies  Allergen Reactions  . Sulfa Antibiotics Rash   Prior to Admission medications   Medication Sig Start Date End Date Taking? Authorizing Provider  Acetaminophen (TYLENOL PO) Take by mouth as needed.   Yes [provider]  aspirin 81 MG chewable tablet Chew 81 mg by mouth daily.   Yes [provider]  cholecalciferol (VITAMIN D) 1000 UNITS tablet Take 2,000 Units by mouth daily.   Yes [provider]  cyclobenzaprine (FLEXERIL) 10 MG tablet Take 10 mg by mouth 3 (three) times daily as needed. For headache   Yes [provider]  Diclofenac Sodium (PENNSAID) 2 % SOLN Place onto the skin.   Yes [provider]  fexofenadine (ALLEGRA) 180 MG tablet Take 180 mg by mouth daily.   Yes [provider]  Multiple Vitamin (MULTIVITAMIN WITH MINERALS) TABS Take 1 tablet by mouth daily.   Yes [provider]  Na Sulfate-K Sulfate-Mg Sulf (SUPREP BOWEL PREP KIT) 17.5-3.13-1.6 GM/177ML SOLN Suprep as directed, no substitutions 12/22/19  Yes Irene Shipper, MD  naproxen sodium (ANAPROX) 220 MG tablet Take 220 mg by mouth 2 (two) times daily with a meal.   Yes [provider]  OVER THE COUNTER MEDICATION OTC laxative- unsure which kind- uses almost daily   Yes [provider]  rosuvastatin (CRESTOR) 5 MG tablet Take 5 mg by mouth daily.   Yes [provider]  triamterene-hydrochlorothiazide (MAXZIDE-25) 37.5-25 MG per tablet Take 1 tablet by mouth daily.   Yes [provider]     Positive ROS: Otherwise negative  All other systems have been reviewed and were otherwise negative with the exception of those mentioned in the HPI and as above.  Physical Exam: Constitutional: Alert, well-appearing, no acute distress Ears: External ears without lesions or tenderness.   Patient has a large amount of wax in the left ear canal that was occluding visualization of the TM.  This was removed with a curette.  The left TM was clear with good mobility on pneumatic otoscopy.  She had moderate amount of wax buildup on the right side that was cleaned with a curette and the right TM likewise was clear. Nasal: External nose without lesions. Septum is deviated to the left.  The middle meatus regions were  clear bilaterally with no acute purulent discharge noted and no polyps noted..  Of note she has some adhesions between the left inferior turbinate and the septum which may be indicative of previous infection. Oral: Lips and gums without lesions. Tongue and palate mucosa without lesions. Posterior oropharynx clear. Neck: No palpable adenopathy or masses Respiratory: Breathing comfortably  Skin: No facial/neck lesions or rash noted.  Cerumen impaction removal  Date/Time: 03/01/2020 2:07 PM Performed by: Rozetta Nunnery, MD Authorized by: Rozetta Nunnery, MD   Consent:    Consent obtained:  Verbal   Consent given by:  Patient   Risks discussed:  Pain and bleeding Procedure details:    Location:  L ear and R ear   Procedure type: curette   Post-procedure details:    Inspection:  TM intact and canal normal   Hearing quality:  Improved   Patient tolerance of procedure:  Tolerated well, no immediate complications Comments:     TMs are clear bilaterally.    Assessment: Chronic headaches questionable etiology.  I doubt these are caused by her sinuses as she has an aplastic left frontal sinus on previous CT scan 2 and half years ago. She has not responded to antibiotics x3 however apparently initially improved with use of Allegra-D.  Plan: Patient is scheduled to get a CT scan of the sinuses this weekend and I will plan on reviewing this and asked the patient to call me back next Tuesday concerning results of the CT scan.  She does have a moderate septal  deviation and some adhesions between the septum and left inferior turbinate which may be indicative of nasal symptoms. If the scan is clear would recommend further evaluation with neurologist concerning recommendations for headaches.   Radene Journey, MD   CC:

## 2020-03-05 ENCOUNTER — Ambulatory Visit
Admission: RE | Admit: 2020-03-05 | Discharge: 2020-03-05 | Disposition: A | Discharge status: Home / Self Care | Payer: Federal, State, Local not specified - PPO | Source: Ambulatory Visit | Attending: Family Medicine | Admitting: Family Medicine

## 2020-03-05 DIAGNOSIS — R519 Headache, unspecified: Secondary | ICD-10-CM | POA: Diagnosis not present

## 2020-03-05 DIAGNOSIS — Z8249 Family history of ischemic heart disease and other diseases of the circulatory system: Secondary | ICD-10-CM

## 2020-03-24 ENCOUNTER — Other Ambulatory Visit: Payer: Federal, State, Local not specified - PPO

## 2020-04-10 DIAGNOSIS — E7800 Pure hypercholesterolemia, unspecified: Secondary | ICD-10-CM | POA: Diagnosis not present

## 2020-04-10 DIAGNOSIS — I10 Essential (primary) hypertension: Secondary | ICD-10-CM | POA: Diagnosis not present

## 2020-04-10 DIAGNOSIS — R7303 Prediabetes: Secondary | ICD-10-CM | POA: Diagnosis not present

## 2020-04-11 ENCOUNTER — Ambulatory Visit (INDEPENDENT_AMBULATORY_CARE_PROVIDER_SITE_OTHER): Payer: Federal, State, Local not specified - PPO

## 2020-04-11 ENCOUNTER — Ambulatory Visit (INDEPENDENT_AMBULATORY_CARE_PROVIDER_SITE_OTHER): Payer: Federal, State, Local not specified - PPO | Admitting: Family Medicine

## 2020-04-11 ENCOUNTER — Encounter: Payer: Self-pay | Admitting: Family Medicine

## 2020-04-11 ENCOUNTER — Other Ambulatory Visit: Payer: Self-pay

## 2020-04-11 VITALS — BP 110/84 | HR 76 | Ht 62.0 in

## 2020-04-11 DIAGNOSIS — M170 Bilateral primary osteoarthritis of knee: Secondary | ICD-10-CM | POA: Diagnosis not present

## 2020-04-11 DIAGNOSIS — M542 Cervicalgia: Secondary | ICD-10-CM

## 2020-04-11 DIAGNOSIS — G8929 Other chronic pain: Secondary | ICD-10-CM

## 2020-04-11 DIAGNOSIS — M25561 Pain in right knee: Secondary | ICD-10-CM | POA: Diagnosis not present

## 2020-04-11 DIAGNOSIS — M47812 Spondylosis without myelopathy or radiculopathy, cervical region: Secondary | ICD-10-CM

## 2020-04-11 DIAGNOSIS — M5030 Other cervical disc degeneration, unspecified cervical region: Secondary | ICD-10-CM

## 2020-04-11 DIAGNOSIS — M25562 Pain in left knee: Secondary | ICD-10-CM

## 2020-04-11 NOTE — Progress Notes (Signed)
[place] Phone: [phone] Subjective:   [name], am serving as a scribe for Dr. Hulan Saas. This visit occurred during the SARS-CoV-2 public health emergency.  Safety protocols were in place, including screening questions prior to the visit, additional usage of staff PPE, and extensive cleaning of exam room while observing appropriate contact time as indicated for disinfecting solutions.   I'm seeing this patient by the request  of:  Kelton Pillar, MD  CC: Bilateral knee pain, neck pain  OZD:[id number]   02/17/2019 Left knee having acute exacerbation of a chronic problem.  Given home exercises, injection again.  Triple light given at the moment.  Discussed which activities to do which wants to avoid.  Patient will increase activity slowly.  Discussed topical anti-inflammatories, oral anti-inflammatories.  Follow-up again in 10 weeks  Update 04/11/2020 Dawn [name] is a 64 y.o. female coming in with complaint of bilateral knee pain. Patient states that her left knee is worse than right knee. Pain is constant on left side. Both knees are getting worse in general.    Patient did have x-rays today.  Shows the patient has nearly bone-on-bone osteoarthritic changes of the medial compartment of the left knee and moderate arthritic changes of the right knee  Also having right sided neck pain that is giving her headaches. Wakes up in pain. Pain occurs not daily but quite frequently.   Past Medical History:  Diagnosis Date  . Abnormal glucose   . Allergic rhinitis   . Allergy   . Anxiety    no per pt  . Arthritis    bilateral knees- get cortisone injections  . Asthma    as a child  . Carpal tunnel syndrome    right  . Cervical dysplasia    1996  . Endometriosis   . Fibroids   . Hemorrhoids   . High cholesterol   . Hyperlipemia   . Hypertension   . Migraine   . Neck pain   . Stroke [facility]) 2014    TIA- negative work up in ED 2014  . Transient cerebral ischemia    10-22-12 ED, tia s/s but negative work up in ED. see note    Past Surgical History:  Procedure Laterality Date  . ABDOMINAL HYSTERECTOMY  Partial  . CARPAL TUNNEL RELEASE Right   . cold knife cone     for dysplasia  . COLONOSCOPY     last 11-2008 w/patterson, hems only   . KNEE ARTHROSCOPY WITH MEDIAL MENISECTOMY Right 03/28/2016   Procedure: RIGHT KNEE ARTHROSCOPY CHONDROPLASTY WITH MEDIAL MENISCECTOMY;  Surgeon: [name], MD;  Location: [place];  Service: Orthopedics;  Laterality: Right;  . TRIGGER FINGER RELEASE     thumb, middle finger   . TUBAL LIGATION     Social History   Socioeconomic History  . Marital status: Married    Spouse name: Not on file  . Number of children: 1  . Years of education: Not on file  . Highest education level: Not on file  Occupational History  . Occupation: Retired  Tobacco Use  . Smoking status: Never Smoker  . Smokeless tobacco: Never Used  Vaping Use  . Vaping Use: Never used  Substance and Sexual Activity  . Alcohol use: Yes    Alcohol/week: 1.0 standard drink    Types: 1 Glasses of wine per week    Comment: occasionally  . Drug use: No  .  Sexual activity: Never  Other Topics Concern  . Not on file  Social History Narrative  . Not on file   Social Determinants of Health   Financial Resource Strain:   . Difficulty of Paying Living Expenses:   Food Insecurity:   . Worried About Programme researcher, broadcasting/film/video in the Last Year:   . Barista in the Last Year:   Transportation Needs:   . Freight forwarder (Medical):   Marland Kitchen Lack of Transportation (Non-Medical):   Physical Activity:   . Days of Exercise per Week:   . Minutes of Exercise per Session:   Stress:   . Feeling of Stress :   Social Connections:   . Frequency of Communication with Friends and Family:   . Frequency of Social Gatherings with Friends and Family:   . Attends Religious  Services:   . Active Member of Clubs or Organizations:   . Attends Banker Meetings:   Marland Kitchen Marital Status:    Allergies  Allergen Reactions  . Sulfa Antibiotics Rash   Family History  Problem Relation Age of Onset  . Colon cancer Father   . Stomach cancer Father   . Diabetes Mother   . Hypertension Mother   . CVA Mother   . Kidney disease Mother   . Aneurysm Mother   . Colon cancer Maternal Uncle   . Breast cancer Maternal Aunt   . Kidney disease Maternal Aunt   . Heart disease Brother   . Throat cancer Maternal Uncle   . Rectal cancer Neg Hx   . Esophageal cancer Neg Hx      Current Outpatient Medications (Cardiovascular):  .  rosuvastatin (CRESTOR) 5 MG tablet, Take 5 mg by mouth daily. Marland Kitchen  triamterene-hydrochlorothiazide (MAXZIDE-25) 37.5-25 MG per tablet, Take 1 tablet by mouth daily.  Current Outpatient Medications (Respiratory):  .  fexofenadine (ALLEGRA) 180 MG tablet, Take 180 mg by mouth daily.  Current Outpatient Medications (Analgesics):  Marland Kitchen  Acetaminophen (TYLENOL PO), Take by mouth as needed. Marland Kitchen  aspirin 81 MG chewable tablet, Chew 81 mg by mouth daily. .  naproxen sodium (ANAPROX) 220 MG tablet, Take 220 mg by mouth 2 (two) times daily with a meal.   Current Outpatient Medications (Other):  .  cholecalciferol (VITAMIN D) 1000 UNITS tablet, Take 2,000 Units by mouth daily. .  cyclobenzaprine (FLEXERIL) 10 MG tablet, Take 10 mg by mouth 3 (three) times daily as needed. For headache .  Diclofenac Sodium (PENNSAID) 2 % SOLN, Place onto the skin. .  Multiple Vitamin (MULTIVITAMIN WITH MINERALS) TABS, Take 1 tablet by mouth daily. .  Na Sulfate-K Sulfate-Mg Sulf (SUPREP BOWEL PREP KIT) 17.5-3.13-1.6 GM/177ML SOLN, Suprep as directed, no substitutions .  OVER THE COUNTER MEDICATION, OTC laxative- unsure which kind- uses almost daily   Reviewed prior external information including notes and imaging from  primary care provider As well as notes that  were available from care everywhere and other healthcare systems.  Past medical history, social, surgical and family history all reviewed in electronic medical record.  No pertanent information unless stated regarding to the chief complaint.   Review of Systems:  No headache, visual changes, nausea, vomiting, diarrhea, constipation, dizziness, abdominal pain, skin rash, fevers, chills, night sweats, weight loss, swollen lymph nodes, body aches, joint swelling, chest pain, shortness of breath, mood changes. POSITIVE muscle aches  Objective  Blood pressure 110/84, pulse 76, height 5\' 2"  (1.575 m), SpO2 96 %.   General: No apparent  distress alert and oriented x3 mood and affect normal, dressed appropriately.  HEENT: Pupils equal, extraocular movements intact  Respiratory: Patient's speak in full sentences and does not appear short of breath  Cardiovascular: No lower extremity edema, non tender, no erythema  Neuro: Cranial nerves II through XII are intact, neurovascularly intact in all extremities with 2+ DTRs and 2+ pulses.  Gait normal with good balance and coordination.  MSK:   Neck exam does have loss of lordosis.  Patient does have tender to palpation diffusely in the neck.  Mild positive Spurling's with left-sided radicular symptoms into the C7 distribution but no weakness noted.  Deep tendon reflexes are intact Knee: Bilateral valgus deformity noted. Large thigh to calf ratio.  Tender to palpation over medial and PF joint line.  ROM full in flexion and extension and lower leg rotation. instability with valgus force.  painful patellar compression. Patellar glide with moderate crepitus. Patellar and quadriceps tendons unremarkable. Hamstring and quadriceps strength is normal. Contralateral knee shows    Impression and Recommendations:     The above documentation has been reviewed and is accurate and complete Lyndal Pulley, DO       Note: This dictation was prepared with  Dragon dictation along with smaller phrase technology. Any transcriptional errors that result from this process are unintentional.

## 2020-04-11 NOTE — Assessment & Plan Note (Signed)
Known arthritic changes.  Has not responded to the conservative therapy but patient does not want any surgical intervention.  Patient's left medial compartment is nearly bone-on-bone with the x-rays taken today.  Patient will continue with the bracing.

## 2020-04-11 NOTE — Assessment & Plan Note (Signed)
History of spondylosis.  Patient feels that it is worsening at this time.  Starting to affect daily activities.  Has not responded as well to osteopathic manipulation.  Continues to have trouble and I do feel x-rays and MRI is potentially beneficial.  Patient would consider the possibility of epidurals.  Would not want surgery.  Could consider repeating physical therapy.  Will discuss with patient after imaging.

## 2020-04-11 NOTE — Patient Instructions (Signed)
Xray neck MRI neck Over book in 2-3 weeks

## 2020-04-13 DIAGNOSIS — Z01419 Encounter for gynecological examination (general) (routine) without abnormal findings: Secondary | ICD-10-CM | POA: Diagnosis not present

## 2020-04-13 DIAGNOSIS — Z1231 Encounter for screening mammogram for malignant neoplasm of breast: Secondary | ICD-10-CM | POA: Diagnosis not present

## 2020-04-26 ENCOUNTER — Other Ambulatory Visit: Payer: Self-pay

## 2020-04-26 ENCOUNTER — Encounter: Payer: Self-pay | Admitting: Family Medicine

## 2020-04-26 ENCOUNTER — Ambulatory Visit (INDEPENDENT_AMBULATORY_CARE_PROVIDER_SITE_OTHER): Payer: Federal, State, Local not specified - PPO | Admitting: Family Medicine

## 2020-04-26 ENCOUNTER — Ambulatory Visit: Payer: Federal, State, Local not specified - PPO | Admitting: Family Medicine

## 2020-04-26 DIAGNOSIS — M170 Bilateral primary osteoarthritis of knee: Secondary | ICD-10-CM

## 2020-04-26 DIAGNOSIS — M47812 Spondylosis without myelopathy or radiculopathy, cervical region: Secondary | ICD-10-CM | POA: Diagnosis not present

## 2020-04-26 NOTE — Assessment & Plan Note (Signed)
Degenerative arthritis of the knees.  Chronic problem with exacerbation.  Bone-on-bone osteoarthritic changes noted of the left side.  Discussed with patient about icing regimen, home exercises, which activities to doing which wants to avoid.  Patient will consider the possibility of viscosupplementation or PRP.  Follow-up otherwise in 8 to 10 weeks

## 2020-04-26 NOTE — Assessment & Plan Note (Signed)
Awaiting MRI.  Chronic problem.  Flexeril for breakthrough

## 2020-04-26 NOTE — Progress Notes (Signed)
Dawn [name] D.O. [facility] [address] [place] [address] Phone: [phone] Subjective:   I, Dawn [name], am serving as a scribe for Dr. Zachary [name]. This visit occurred during the SARS-CoV-2 public health emergency.  Safety protocols were in place, including screening questions prior to the visit, additional usage of staff PPE, and extensive cleaning of exam room while observing appropriate contact time as indicated for disinfecting solutions.   I'm seeing this patient by the request  of:  [name], Elaine, MD  CC: Neck pain and knee pain follow-up  HPI:Subjective   04/11/2020 Known arthritic changes.  Has not responded to the conservative therapy but patient does not want any surgical intervention.  Patient's left medial compartment is nearly bone-on-bone with the x-rays taken today.  Patient will continue with the bracing.  History of spondylosis.  Patient feels that it is worsening at this time.  Starting to affect daily activities.  Has not responded as well to osteopathic manipulation.  Continues to have trouble and I do feel x-rays and MRI is potentially beneficial.  Patient would consider the possibility of epidurals.  Would not want surgery.  Could consider repeating physical therapy.  Will discuss with patient after imaging.  Update 04/26/2020 Dawn [name] is a 63 y.o. female coming in with complaint of neck pain and bilateral knee pain. Patient states that her knees continue to bother her. Unable to stand or sit for prolonged periods.   IMPRESSION: 1. No acute fracture or dislocation. 2. Bilateral knee osteoarthritic changes, left greater right, and progressed since the prior radiograph of 2019. Left knee does have near bone-on-bone osteoarthritic changes.  This was independently visualized by me.  Neck exam awaiting the MRI ordered for August 6.  Continues to have some pain with some intermittent headaches.  No worsening but would not state any  improvement with     Past Medical History:  Diagnosis Date  . Abnormal glucose   . Allergic rhinitis   . Allergy   . Anxiety    no per pt  . Arthritis    bilateral knees- get cortisone injections  . Asthma    as a child  . Carpal tunnel syndrome    right  . Cervical dysplasia    1996  . Endometriosis   . Fibroids   . Hemorrhoids   . High cholesterol   . Hyperlipemia   . Hypertension   . Migraine   . Neck pain   . Stroke ([facility]) 2014   TIA- negative work up in ED 2014  . Transient cerebral ischemia    10-22-12 ED, tia s/s but negative work up in ED. see note    Past Surgical History:  Procedure Laterality Date  . ABDOMINAL HYSTERECTOMY  Partial  . CARPAL TUNNEL RELEASE Right   . cold knife cone     for dysplasia  . COLONOSCOPY     last 11-2008 w/patterson, hems only   . KNEE ARTHROSCOPY WITH MEDIAL MENISECTOMY Right 03/28/2016   Procedure: RIGHT KNEE ARTHROSCOPY CHONDROPLASTY WITH MEDIAL MENISCECTOMY;  Surgeon: Daniel F Murphy, MD;  Location: [facility];  Service: Orthopedics;  Laterality: Right;  . TRIGGER FINGER RELEASE     thumb, middle finger   . TUBAL LIGATION     Social History   Socioeconomic History  . Marital status: Married    Spouse name: Not on file  . Number of children: 1  . Years of education: Not on file  . Highest education   level: Not on file  Occupational History  . Occupation: Retired  Tobacco Use  . Smoking status: Never Smoker  . Smokeless tobacco: Never Used  Vaping Use  . Vaping Use: Never used  Substance and Sexual Activity  . Alcohol use: Yes    Alcohol/week: 1.0 standard drink    Types: 1 Glasses of wine per week    Comment: occasionally  . Drug use: No  . Sexual activity: Never  Other Topics Concern  . Not on file  Social History Narrative  . Not on file   Social Determinants of Health   Financial Resource Strain:   . Difficulty of Paying Living Expenses:   Food Insecurity:   . Worried About Ship broker in the Last Year:   . Arboriculturist in the Last Year:   Transportation Needs:   . Film/video editor (Medical):   Dawn Kitchen Lack of Transportation (Non-Medical):   Physical Activity:   . Days of Exercise per Week:   . Minutes of Exercise per Session:   Stress:   . Feeling of Stress :   Social Connections:   . Frequency of Communication with Friends and Family:   . Frequency of Social Gatherings with Friends and Family:   . Attends Religious Services:   . Active Member of Clubs or Organizations:   . Attends Archivist Meetings:   Dawn Kitchen Marital Status:    Allergies  Allergen Reactions  . Sulfa Antibiotics Rash   Family History  Problem Relation Age of Onset  . Colon cancer Father   . Stomach cancer Father   . Diabetes Mother   . Hypertension Mother   . CVA Mother   . Kidney disease Mother   . Aneurysm Mother   . Colon cancer Maternal Uncle   . Breast cancer Maternal Aunt   . Kidney disease Maternal Aunt   . Heart disease Brother   . Throat cancer Maternal Uncle   . Rectal cancer Neg Hx   . Esophageal cancer Neg Hx      Current Outpatient Medications (Cardiovascular):  .  rosuvastatin (CRESTOR) 5 MG tablet, Take 5 mg by mouth daily. Dawn Kitchen  triamterene-hydrochlorothiazide (MAXZIDE-25) 37.5-25 MG per tablet, Take 1 tablet by mouth daily.  Current Outpatient Medications (Respiratory):  .  fexofenadine (ALLEGRA) 180 MG tablet, Take 180 mg by mouth daily.  Current Outpatient Medications (Analgesics):  Dawn Kitchen  Acetaminophen (TYLENOL PO), Take by mouth as needed. Dawn Kitchen  aspirin 81 MG chewable tablet, Chew 81 mg by mouth daily. .  naproxen sodium (ANAPROX) 220 MG tablet, Take 220 mg by mouth 2 (two) times daily with a meal.   Current Outpatient Medications (Other):  .  cholecalciferol (VITAMIN D) 1000 UNITS tablet, Take 2,000 Units by mouth daily. .  cyclobenzaprine (FLEXERIL) 10 MG tablet, Take 10 mg by mouth 3 (three) times daily as needed. For headache .  Diclofenac  Sodium (PENNSAID) 2 % SOLN, Place onto the skin. .  Multiple Vitamin (MULTIVITAMIN WITH MINERALS) TABS, Take 1 tablet by mouth daily. .  Na Sulfate-K Sulfate-Mg Sulf (SUPREP BOWEL PREP KIT) 17.5-3.13-1.6 GM/177ML SOLN, Suprep as directed, no substitutions .  OVER THE COUNTER MEDICATION, OTC laxative- unsure which kind- uses almost daily   Reviewed prior external information including notes and imaging from  primary care provider As well as notes that were available from care everywhere and other healthcare systems.  Past medical history, social, surgical and family history all reviewed in electronic medical record.  No pertanent information unless stated regarding to the chief complaint.   Review of Systems:  No headache, visual changes, nausea, vomiting, diarrhea, constipation, dizziness, abdominal pain, skin rash, fevers, chills, night sweats, weight loss, swollen lymph nodes, body aches, joint swelling, chest pain, shortness of breath, mood changes. POSITIVE muscle aches  Objective  Blood pressure (!) 126/92, pulse 92, height 5' 2" (1.575 m), weight 169 lb (76.7 kg), SpO2 95 %.   General: No apparent distress alert and oriented x3 mood and affect normal, dressed appropriately.  HEENT: Pupils equal, extraocular movements intact  Respiratory: Patient's speak in full sentences and does not appear short of breath  Cardiovascular: No lower extremity edema, non tender, no erythema   Neck exam still has some loss of lordosis.  Patient has some tenderness diffusely.  Deferred rest of the exam until after imaging. Gait antalgic Knee: Bilateral valgus deformity noted.  Abnormal thigh to calf ratio.  Tender to palpation over medial and PF joint line.  ROM full in flexion and extension and lower leg rotation. instability with valgus force.  painful patellar compression. Patellar glide with moderate crepitus. Patellar and quadriceps tendons unremarkable. Hamstring and quadriceps strength is  normal.  After informed written and verbal consent, patient was seated on exam table. Right knee was prepped with alcohol swab and utilizing anterolateral approach, patient's right knee space was injected with 4:1  marcaine 0.5%: Kenalog 76m/dL. Patient tolerated the procedure well without immediate complications.  After informed written and verbal consent, patient was seated on exam table. Left knee was prepped with alcohol swab and utilizing anterolateral approach, patient's left knee space was injected with 4:1  marcaine 0.5%: Kenalog 442mdL. Patient tolerated the procedure well without immediate complications.    Impression and Recommendations:     The above documentation has been reviewed and is accurate and complete ZaLyndal PulleyDO       Note: This dictation was prepared with Dragon dictation along with smaller phrase technology. Any transcriptional errors that result from this process are unintentional.

## 2020-04-26 NOTE — Patient Instructions (Signed)
See me again in 8-10 weeks I will write you about MRI

## 2020-04-27 ENCOUNTER — Ambulatory Visit: Payer: Federal, State, Local not specified - PPO | Admitting: Family Medicine

## 2020-05-05 ENCOUNTER — Other Ambulatory Visit: Payer: Self-pay

## 2020-05-05 ENCOUNTER — Ambulatory Visit
Admission: RE | Admit: 2020-05-05 | Discharge: 2020-05-05 | Disposition: A | Discharge status: Home / Self Care | Payer: Federal, State, Local not specified - PPO | Source: Ambulatory Visit | Attending: Family Medicine | Admitting: Family Medicine

## 2020-05-05 DIAGNOSIS — M5030 Other cervical disc degeneration, unspecified cervical region: Secondary | ICD-10-CM

## 2020-05-16 ENCOUNTER — Telehealth: Payer: Self-pay

## 2020-05-16 NOTE — Telephone Encounter (Signed)
Can you call and schedule patient for the bilateral orthovisc injections please. She is already scheduled for next month but was wanting to be seen sooner if she could.

## 2020-05-16 NOTE — Telephone Encounter (Signed)
Scheduled

## 2020-05-19 ENCOUNTER — Other Ambulatory Visit: Payer: Self-pay

## 2020-05-19 ENCOUNTER — Ambulatory Visit (INDEPENDENT_AMBULATORY_CARE_PROVIDER_SITE_OTHER): Payer: Federal, State, Local not specified - PPO | Admitting: Family Medicine

## 2020-05-19 ENCOUNTER — Encounter: Payer: Self-pay | Admitting: Family Medicine

## 2020-05-19 DIAGNOSIS — M170 Bilateral primary osteoarthritis of knee: Secondary | ICD-10-CM | POA: Diagnosis not present

## 2020-05-19 NOTE — Assessment & Plan Note (Signed)
Viscosupplementation about 30 today.  This is first injection in a series of 4.  Patient hopefully will do relatively well.  Has not done it previously.  Known to have fairly severe arthritic changes.  Follow-up 1 week for second in a series of 4 injections.

## 2020-05-19 NOTE — Progress Notes (Signed)
[place] Phone: [phone] Subjective:   Dawn [name], am serving as a scribe for Dr. Hulan Saas. This visit occurred during the SARS-CoV-2 public health emergency.  Safety protocols were in place, including screening questions prior to the visit, additional usage of staff PPE, and extensive cleaning of exam room while observing appropriate contact time as indicated for disinfecting solutions.   I'm seeing this patient by the request  of:  Kelton Pillar, MD  CC: Bilateral knee pain  [id number]   04/26/2020 Degenerative arthritis of the knees.  Chronic problem with exacerbation.  Bone-on-bone osteoarthritic changes noted of the left side.  Discussed with patient about icing regimen, home exercises, which activities to doing which wants to avoid.  Patient will consider the possibility of viscosupplementation or PRP.  Follow-up otherwise in 8 to 10 weeks  Update 05/19/2020 Dawn [name] is a 64 y.o. female coming in with complaint of neck and bilateral knee pain. Orthovisc approved. Patient states that her neck pain continues but is not as bad as her knees. Uses tennis balls to relieve tightness.  Patient still has some instability noted of the knee at the moment.  States that from time to time feels like his normal development.   MRI cervical 05/05/2020 IMPRESSION: Multilevel degenerative changes as detailed above without substantial progression since 2015. Stenosis evaluation is suboptimal due to motion artifact.     Past Medical History:  Diagnosis Date  . Abnormal glucose   . Allergic rhinitis   . Allergy   . Anxiety    [name] per pt  . Arthritis    bilateral knees- get cortisone injections  . Asthma    as a child  . Carpal tunnel syndrome    right  . Cervical dysplasia    1996  . Endometriosis   . Fibroids   . Hemorrhoids   . High cholesterol   . Hyperlipemia   . Hypertension   .  Migraine   . Neck pain   . Stroke [facility]) 2014   TIA- negative work up in ED 2014  . Transient cerebral ischemia    10-22-12 ED, tia s/s but negative work up in ED. see note    Past Surgical History:  Procedure Laterality Date  . ABDOMINAL HYSTERECTOMY  Partial  . CARPAL TUNNEL RELEASE Right   . cold knife cone     for dysplasia  . COLONOSCOPY     last 11-2008 w/patterson, hems only   . KNEE ARTHROSCOPY WITH MEDIAL MENISECTOMY Right 03/28/2016   Procedure: RIGHT KNEE ARTHROSCOPY CHONDROPLASTY WITH MEDIAL MENISCECTOMY;  Surgeon: [name], MD;  Location: [place];  Service: Orthopedics;  Laterality: Right;  . TRIGGER FINGER RELEASE     thumb, middle finger   . TUBAL LIGATION     Social History   Socioeconomic History  . Marital status: Married    Spouse name: Not on file  . Number of children: 1  . Years of education: Not on file  . Highest education level: Not on file  Occupational History  . Occupation: Retired  Tobacco Use  . Smoking status: Never Smoker  . Smokeless tobacco: Never Used  Vaping Use  . Vaping Use: Never used  Substance and Sexual Activity  . Alcohol use: Yes    Alcohol/week: 1.0 standard drink    Types: 1 Glasses of wine per week    Comment: occasionally  . Drug use: [name]  .  Sexual activity: Never  Other Topics Concern  . Not on file  Social History Narrative  . Not on file   Social Determinants of Health   Financial Resource Strain:   . Difficulty of Paying Living Expenses: Not on file  Food Insecurity:   . Worried About Charity fundraiser in the Last Year: Not on file  . Ran Out of Food in the Last Year: Not on file  Transportation Needs:   . Lack of Transportation (Medical): Not on file  . Lack of Transportation (Non-Medical): Not on file  Physical Activity:   . Days of Exercise per Week: Not on file  . Minutes of Exercise per Session: Not on file  Stress:   . Feeling of Stress : Not on file  Social Connections:   .  Frequency of Communication with Friends and Family: Not on file  . Frequency of Social Gatherings with Friends and Family: Not on file  . Attends Religious Services: Not on file  . Active Member of Clubs or Organizations: Not on file  . Attends Archivist Meetings: Not on file  . Marital Status: Not on file   Allergies  Allergen Reactions  . Sulfa Antibiotics Rash   Family History  Problem Relation Age of Onset  . Colon cancer Father   . Stomach cancer Father   . Diabetes Mother   . Hypertension Mother   . CVA Mother   . Kidney disease Mother   . Aneurysm Mother   . Colon cancer Maternal Uncle   . Breast cancer Maternal Aunt   . Kidney disease Maternal Aunt   . Heart disease Brother   . Throat cancer Maternal Uncle   . Rectal cancer Neg Hx   . Esophageal cancer Neg Hx      Current Outpatient Medications (Cardiovascular):  .  rosuvastatin (CRESTOR) 5 MG tablet, Take 5 mg by mouth daily. Marland Kitchen  triamterene-hydrochlorothiazide (MAXZIDE-25) 37.5-25 MG per tablet, Take 1 tablet by mouth daily.  Current Outpatient Medications (Respiratory):  .  fexofenadine (ALLEGRA) 180 MG tablet, Take 180 mg by mouth daily.  Current Outpatient Medications (Analgesics):  Marland Kitchen  Acetaminophen (TYLENOL PO), Take by mouth as needed. Marland Kitchen  aspirin 81 MG chewable tablet, Chew 81 mg by mouth daily. .  naproxen sodium (ANAPROX) 220 MG tablet, Take 220 mg by mouth 2 (two) times daily with a meal.   Current Outpatient Medications (Other):  .  cholecalciferol (VITAMIN D) 1000 UNITS tablet, Take 2,000 Units by mouth daily. .  cyclobenzaprine (FLEXERIL) 10 MG tablet, Take 10 mg by mouth 3 (three) times daily as needed. For headache .  Diclofenac Sodium (PENNSAID) 2 % SOLN, Place onto the skin. .  Multiple Vitamin (MULTIVITAMIN WITH MINERALS) TABS, Take 1 tablet by mouth daily. .  Na Sulfate-K Sulfate-Mg Sulf (SUPREP BOWEL PREP KIT) 17.5-3.13-1.6 GM/177ML SOLN, Suprep as directed, [name] substitutions .   OVER THE COUNTER MEDICATION, OTC laxative- unsure which kind- uses almost daily   Reviewed prior external information including notes and imaging from  primary care provider As well as notes that were available from care everywhere and other healthcare systems.  Past medical history, social, surgical and family history all reviewed in electronic medical record.  [name] pertanent information unless stated regarding to the chief complaint.   Review of Systems:  [name] headache, visual changes, nausea, vomiting, diarrhea, constipation, dizziness, abdominal pain, skin rash, fevers, chills, night sweats, weight loss, swollen lymph nodes, , chest pain, shortness of breath, mood  changes. POSITIVE muscle aches, body aches and joint swelling  Objective  There were [name] vitals taken for this visit.   General: [name] apparent distress alert and oriented x3 mood and affect normal, dressed appropriately.  HEENT: Pupils equal, extraocular movements intact  Respiratory: Patient's speak in full sentences and does not appear short of breath  Cardiovascular: [name] lower extremity edema, non tender, [name] erythema  Neuro: Cranial nerves II through XII are intact, neurovascularly intact in all extremities with 2+ DTRs and 2+ pulses.  Gait antalgic MSK:  Knee: Bilateral valgus deformity noted.  Abnormal thigh to calf ratio.  Tender to palpation over medial and PF joint line.  Left greater than right ROM full in flexion and extension and lower leg rotation. instability with valgus force.  Left greater than right painful patellar compression. Patellar glide with moderate crepitus. Patellar and quadriceps tendons unremarkable. Hamstring and quadriceps strength is normal.  After informed written and verbal consent, patient was seated on exam table. Right knee was prepped with alcohol swab and utilizing anterolateral approach, patient's right knee space was injected with15 mg/2.5 mL of Orthovisc(sodium hyaluronate) in a prefilled  syringe was injected easily into the knee through a 22-gauge needle..Patient tolerated the procedure well without immediate complications.  After informed written and verbal consent, patient was seated on exam table. Left knee was prepped with alcohol swab and utilizing anterolateral approach, patient's left knee space was injected with15 mg/2.5 mL of Orthovisc(sodium hyaluronate) in a prefilled syringe was injected easily into the knee through a 22-gauge needle..Patient tolerated the procedure well without immediate complications.    Impression and Recommendations:     The above documentation has been reviewed and is accurate and complete Dawn [name]       Note: This dictation was prepared with Dragon dictation along with smaller phrase technology. Any transcriptional errors that result from this process are unintentional.

## 2020-05-24 ENCOUNTER — Encounter: Payer: Self-pay | Admitting: Family Medicine

## 2020-05-24 ENCOUNTER — Ambulatory Visit: Payer: Federal, State, Local not specified - PPO | Admitting: Family Medicine

## 2020-05-24 ENCOUNTER — Ambulatory Visit (INDEPENDENT_AMBULATORY_CARE_PROVIDER_SITE_OTHER): Payer: Federal, State, Local not specified - PPO | Admitting: Family Medicine

## 2020-05-24 ENCOUNTER — Other Ambulatory Visit: Payer: Self-pay

## 2020-05-24 DIAGNOSIS — M170 Bilateral primary osteoarthritis of knee: Secondary | ICD-10-CM | POA: Diagnosis not present

## 2020-05-24 NOTE — Progress Notes (Signed)
Dawn [name] [address] [place] Phone: [phone] Subjective:   I Dawn [name] am serving as a Education administrator for Dr. Hulan [name].  This visit occurred during the SARS-CoV-2 public health emergency.  Safety protocols were in place, including screening questions prior to the visit, additional usage of staff PPE, and extensive cleaning of exam room while observing appropriate contact time as indicated for disinfecting solutions.   I'm seeing this patient by the request  of:  Dawn Pillar, MD  CC: Bilateral knee pain  [id number]  Dawn [name] is a 64 y.o. female coming in with complaint of bilateral knee pain. Here for 2nd orthovisc injections. Patient states she is feeling better.        Past Medical History:  Diagnosis Date  . Abnormal glucose   . Allergic rhinitis   . Allergy   . Anxiety    no per pt  . Arthritis    bilateral knees- get cortisone injections  . Asthma    as a child  . Carpal tunnel syndrome    right  . Cervical dysplasia    1996  . Endometriosis   . Fibroids   . Hemorrhoids   . High cholesterol   . Hyperlipemia   . Hypertension   . Migraine   . Neck pain   . Stroke [facility]) 2014   TIA- negative work up in ED 2014  . Transient cerebral ischemia    10-22-12 ED, tia s/s but negative work up in ED. see note    Past Surgical History:  Procedure Laterality Date  . ABDOMINAL HYSTERECTOMY  Partial  . CARPAL TUNNEL RELEASE Right   . cold knife cone     for dysplasia  . COLONOSCOPY     last 11-2008 w/patterson, hems only   . KNEE ARTHROSCOPY WITH MEDIAL MENISECTOMY Right 03/28/2016   Procedure: RIGHT KNEE ARTHROSCOPY CHONDROPLASTY WITH MEDIAL MENISCECTOMY;  Surgeon: Dawn Lights, MD;  Location: [place];  Service: Orthopedics;  Laterality: Right;  . TRIGGER FINGER RELEASE     thumb, middle finger   . TUBAL LIGATION     Social History   Socioeconomic History  . Marital status:  Married    Spouse name: Not on file  . Number of children: 1  . Years of education: Not on file  . Highest education level: Not on file  Occupational History  . Occupation: Retired  Tobacco Use  . Smoking status: Never Smoker  . Smokeless tobacco: Never Used  Vaping Use  . Vaping Use: Never used  Substance and Sexual Activity  . Alcohol use: Yes    Alcohol/week: 1.0 standard drink    Types: 1 Glasses of wine per week    Comment: occasionally  . Drug use: No  . Sexual activity: Never  Other Topics Concern  . Not on file  Social History Narrative  . Not on file   Social Determinants of Health   Financial Resource Strain:   . Difficulty of Paying Living Expenses: Not on file  Food Insecurity:   . Worried About Charity fundraiser in the Last Year: Not on file  . Ran Out of Food in the Last Year: Not on file  Transportation Needs:   . Lack of Transportation (Medical): Not on file  . Lack of Transportation (Non-Medical): Not on file  Physical Activity:   . Days of Exercise per Week: Not on file  . Minutes of Exercise per Session:  Not on file  Stress:   . Feeling of Stress : Not on file  Social Connections:   . Frequency of Communication with Friends and Family: Not on file  . Frequency of Social Gatherings with Friends and Family: Not on file  . Attends Religious Services: Not on file  . Active Member of Clubs or Organizations: Not on file  . Attends Archivist Meetings: Not on file  . Marital Status: Not on file   Allergies  Allergen Reactions  . Sulfa Antibiotics Rash   Family History  Problem Relation Age of Onset  . Colon cancer Father   . Stomach cancer Father   . Diabetes Mother   . Hypertension Mother   . CVA Mother   . Kidney disease Mother   . Aneurysm Mother   . Colon cancer Maternal Uncle   . Breast cancer Maternal Aunt   . Kidney disease Maternal Aunt   . Heart disease Brother   . Throat cancer Maternal Uncle   . Rectal cancer Neg Hx     . Esophageal cancer Neg Hx      Current Outpatient Medications (Cardiovascular):  .  rosuvastatin (CRESTOR) 5 MG tablet, Take 5 mg by mouth daily. Marland Kitchen  triamterene-hydrochlorothiazide (MAXZIDE-25) 37.5-25 MG per tablet, Take 1 tablet by mouth daily.  Current Outpatient Medications (Respiratory):  .  fexofenadine (ALLEGRA) 180 MG tablet, Take 180 mg by mouth daily.  Current Outpatient Medications (Analgesics):  Marland Kitchen  Acetaminophen (TYLENOL PO), Take by mouth as needed. Marland Kitchen  aspirin 81 MG chewable tablet, Chew 81 mg by mouth daily. .  naproxen sodium (ANAPROX) 220 MG tablet, Take 220 mg by mouth 2 (two) times daily with a meal.   Current Outpatient Medications (Other):  .  cholecalciferol (VITAMIN D) 1000 UNITS tablet, Take 2,000 Units by mouth daily. .  cyclobenzaprine (FLEXERIL) 10 MG tablet, Take 10 mg by mouth 3 (three) times daily as needed. For headache .  Diclofenac Sodium (PENNSAID) 2 % SOLN, Place onto the skin. .  Multiple Vitamin (MULTIVITAMIN WITH MINERALS) TABS, Take 1 tablet by mouth daily. .  Na Sulfate-K Sulfate-Mg Sulf (SUPREP BOWEL PREP KIT) 17.5-3.13-1.6 GM/177ML SOLN, Suprep as directed, no substitutions .  OVER THE COUNTER MEDICATION, OTC laxative- unsure which kind- uses almost daily   Reviewed prior external information including notes and imaging from  primary care provider As well as notes that were available from care everywhere and other healthcare systems.  Past medical history, social, surgical and family history all reviewed in electronic medical record.  No pertanent information unless stated regarding to the chief complaint.   Review of Systems:  No headache, visual changes, nausea, vomiting, diarrhea, constipation, dizziness, abdominal pain, skin rash, fevers, chills, night sweats, weight loss, swollen lymph nodes, body aches, joint swelling, chest pain, shortness of breath, mood changes. POSITIVE muscle aches  Objective  Blood pressure 110/90, pulse 89,  height $Remov'5\' 2"'fsrXHE$  (1.575 m), weight 181 lb (82.1 kg), SpO2 97 %.   General: No apparent distress alert and oriented x3 mood and affect normal, dressed appropriately.     After informed written and verbal consent, patient was seated on exam table. Right knee was prepped with alcohol swab and utilizing anterolateral approach, patient's right knee space was injected with15 mg/2.5 mL of Orthovisc(sodium hyaluronate) in a prefilled syringe was injected easily into the knee through a 22-gauge needle..Patient tolerated the procedure well without immediate complications.  After informed written and verbal consent, patient was seated on exam table.  Left knee was prepped with alcohol swab and utilizing anterolateral approach, patient's left knee space was injected with15 mg/2.5 mL of Orthovisc(sodium hyaluronate) in a prefilled syringe was injected easily into the knee through a 22-gauge needle..Patient tolerated the procedure well without immediate complications.    Impression and Recommendations:     The above documentation has been reviewed and is accurate and complete Lyndal Pulley, DO       Note: This dictation was prepared with Dragon dictation along with smaller phrase technology. Any transcriptional errors that result from this process are unintentional.

## 2020-05-24 NOTE — Assessment & Plan Note (Signed)
Second in a series of 4 injections for viscosupplementation given today.  Warned patient of potential side effects.  Patient will increase activity as tolerated.  Follow-up with me again in 1 week for her third in a series of 4 injections bilaterally

## 2020-05-24 NOTE — Patient Instructions (Signed)
Good to see you See you next week

## 2020-05-31 NOTE — Progress Notes (Signed)
Dawn [name] Subjective:   I Dawn [name] am serving as a Education administrator for Dr. Hulan Saas.  This visit occurred during the SARS-CoV-2 public health emergency.  Safety protocols were in place, including screening questions prior to the visit, additional usage of staff PPE, and extensive cleaning of exam room while observing appropriate contact time as indicated for disinfecting solutions.   I'm seeing this patient by the request  of:  Dawn Pillar, MD  CC: Bilateral knee arthritis follow-up  [id number]  Dawn [name] is a 64 y.o. female coming in with complaint of bilateral knee pain. Is here today for Orthovisc injections 3 of 4. Patient states she is feeling better.        Past Medical History:  Diagnosis Date  . Abnormal glucose   . Allergic rhinitis   . Allergy   . Anxiety    no per pt  . Arthritis    bilateral knees- get cortisone injections  . Asthma    as a child  . Carpal tunnel syndrome    right  . Cervical dysplasia    1996  . Endometriosis   . Fibroids   . Hemorrhoids   . High cholesterol   . Hyperlipemia   . Hypertension   . Migraine   . Neck pain   . Stroke [facility]) 2014   TIA- negative work up in ED 2014  . Transient cerebral ischemia    10-22-12 ED, tia s/s but negative work up in ED. see note    Past Surgical History:  Procedure Laterality Date  . ABDOMINAL HYSTERECTOMY  Partial  . CARPAL TUNNEL RELEASE Right   . cold knife cone     for dysplasia  . COLONOSCOPY     last 11-2008 w/patterson, hems only   . KNEE ARTHROSCOPY WITH MEDIAL MENISECTOMY Right 03/28/2016   Procedure: RIGHT KNEE ARTHROSCOPY CHONDROPLASTY WITH MEDIAL MENISCECTOMY;  Surgeon: [name], MD;  Location: [place];  Service: Orthopedics;  Laterality: Right;  . TRIGGER FINGER RELEASE     thumb, middle finger   . TUBAL LIGATION     Social History   Socioeconomic  History  . Marital status: Married    Spouse name: Not on file  . Number of children: 1  . Years of education: Not on file  . Highest education level: Not on file  Occupational History  . Occupation: Retired  Tobacco Use  . Smoking status: Never Smoker  . Smokeless tobacco: Never Used  Vaping Use  . Vaping Use: Never used  Substance and Sexual Activity  . Alcohol use: Yes    Alcohol/week: 1.0 standard drink    Types: 1 Glasses of wine per week    Comment: occasionally  . Drug use: No  . Sexual activity: Never  Other Topics Concern  . Not on file  Social History Narrative  . Not on file   Social Determinants of Health   Financial Resource Strain:   . Difficulty of Paying Living Expenses: Not on file  Food Insecurity:   . Worried About Charity fundraiser in the Last Year: Not on file  . Ran Out of Food in the Last Year: Not on file  Transportation Needs:   . Lack of Transportation (Medical): Not on file  . Lack of Transportation (Non-Medical): Not on file  Physical Activity:   . Days of Exercise per Week: Not on file  .  Minutes of Exercise per Session: Not on file  Stress:   . Feeling of Stress : Not on file  Social Connections:   . Frequency of Communication with Friends and Family: Not on file  . Frequency of Social Gatherings with Friends and Family: Not on file  . Attends Religious Services: Not on file  . Active Member of Clubs or Organizations: Not on file  . Attends Archivist Meetings: Not on file  . Marital Status: Not on file   Allergies  Allergen Reactions  . Sulfa Antibiotics Rash   Family History  Problem Relation Age of Onset  . Colon cancer Father   . Stomach cancer Father   . Diabetes Mother   . Hypertension Mother   . CVA Mother   . Kidney disease Mother   . Aneurysm Mother   . Colon cancer Maternal Uncle   . Breast cancer Maternal Aunt   . Kidney disease Maternal Aunt   . Heart disease Brother   . Throat cancer Maternal Uncle    . Rectal cancer Neg Hx   . Esophageal cancer Neg Hx      Current Outpatient Medications (Cardiovascular):  .  rosuvastatin (CRESTOR) 5 MG tablet, Take 5 mg by mouth daily. Marland Kitchen  triamterene-hydrochlorothiazide (MAXZIDE-25) 37.5-25 MG per tablet, Take 1 tablet by mouth daily.  Current Outpatient Medications (Respiratory):  .  fexofenadine (ALLEGRA) 180 MG tablet, Take 180 mg by mouth daily.  Current Outpatient Medications (Analgesics):  Marland Kitchen  Acetaminophen (TYLENOL PO), Take by mouth as needed. Marland Kitchen  aspirin 81 MG chewable tablet, Chew 81 mg by mouth daily. .  naproxen sodium (ANAPROX) 220 MG tablet, Take 220 mg by mouth 2 (two) times daily with a meal.   Current Outpatient Medications (Other):  .  cholecalciferol (VITAMIN D) 1000 UNITS tablet, Take 2,000 Units by mouth daily. .  cyclobenzaprine (FLEXERIL) 10 MG tablet, Take 10 mg by mouth 3 (three) times daily as needed. For headache .  Diclofenac Sodium (PENNSAID) 2 % SOLN, Place onto the skin. .  Multiple Vitamin (MULTIVITAMIN WITH MINERALS) TABS, Take 1 tablet by mouth daily. .  Na Sulfate-K Sulfate-Mg Sulf (SUPREP BOWEL PREP KIT) 17.5-3.13-1.6 GM/177ML SOLN, Suprep as directed, no substitutions .  OVER THE COUNTER MEDICATION, OTC laxative- unsure which kind- uses almost daily   Reviewed prior external information including notes and imaging from  primary care provider As well as notes that were available from care everywhere and other healthcare systems.  Past medical history, social, surgical and family history all reviewed in electronic medical record.  No pertanent information unless stated regarding to the chief complaint.   Review of Systems:  No headache, visual changes, nausea, vomiting, diarrhea, constipation, dizziness, abdominal pain, skin rash, fevers, chills, night sweats, weight loss, swollen lymph nodes, body aches, joint swelling, chest pain, shortness of breath, mood changes. POSITIVE muscle aches  Objective  Blood  pressure 110/70, pulse 83, height $RemoveBe'5\' 2"'rDMJzachn$  (1.575 m), weight 181 lb (82.1 kg), SpO2 97 %.   General: No apparent distress alert and oriented x3 mood and affect normal, dressed appropriately.   After informed written and verbal consent, patient was seated on exam table. Right knee was prepped with alcohol swab and utilizing anterolateral approach, patient's right knee space was injected with15 mg/2.5 mL of Orthovisc(sodium hyaluronate) in a prefilled syringe was injected easily into the knee through a 22-gauge needle..Patient tolerated the procedure well without immediate complications.  After informed written and verbal consent, patient was seated on  exam table. Left knee was prepped with alcohol swab and utilizing anterolateral approach, patient's left knee space was injected with15 mg/2.5 mL of Orthovisc(sodium hyaluronate) in a prefilled syringe was injected easily into the knee through a 22-gauge needle..Patient tolerated the procedure well without immediate complications.    Impression and Recommendations:     The above documentation has been reviewed and is accurate and complete Lyndal Pulley, DO       Note: This dictation was prepared with Dragon dictation along with smaller phrase technology. Any transcriptional errors that result from this process are unintentional.

## 2020-06-01 ENCOUNTER — Ambulatory Visit (INDEPENDENT_AMBULATORY_CARE_PROVIDER_SITE_OTHER): Payer: Federal, State, Local not specified - PPO | Admitting: Family Medicine

## 2020-06-01 ENCOUNTER — Encounter: Payer: Self-pay | Admitting: Family Medicine

## 2020-06-01 ENCOUNTER — Other Ambulatory Visit: Payer: Self-pay

## 2020-06-01 DIAGNOSIS — M170 Bilateral primary osteoarthritis of knee: Secondary | ICD-10-CM

## 2020-06-01 NOTE — Assessment & Plan Note (Signed)
Third in a series of 4 injections for viscosupplementation given again today.  Warned the potential side effects.  Follow-up in 1 week for fourth and final injection in the knees bilaterally

## 2020-06-07 NOTE — Progress Notes (Signed)
[facility] [address] [place] Phone: [phone] Subjective:   .swerving  This visit occurred during the SARS-CoV-2 public health emergency.  Safety protocols were in place, including screening questions prior to the visit, additional usage of staff PPE, and extensive cleaning of exam room while observing appropriate contact time as indicated for disinfecting solutions.   I'm seeing this patient by the request  of:  Kelton Pillar, MD  CC: Bilateral knee pain  [id number]  Dawn [name] is a 64 y.o. female coming in with complaint of bilateral knee pain. Is here for last Orthovisc injection. Patient states she is getting better.      Past Medical History:  Diagnosis Date  . Abnormal glucose   . Allergic rhinitis   . Allergy   . Anxiety    no per pt  . Arthritis    bilateral knees- get cortisone injections  . Asthma    as a child  . Carpal tunnel syndrome    right  . Cervical dysplasia    1996  . Endometriosis   . Fibroids   . Hemorrhoids   . High cholesterol   . Hyperlipemia   . Hypertension   . Migraine   . Neck pain   . Stroke [facility]) 2014   TIA- negative work up in ED 2014  . Transient cerebral ischemia    10-22-12 ED, tia s/s but negative work up in ED. see note    Past Surgical History:  Procedure Laterality Date  . ABDOMINAL HYSTERECTOMY  Partial  . CARPAL TUNNEL RELEASE Right   . cold knife cone     for dysplasia  . COLONOSCOPY     last 11-2008 w/patterson, hems only   . KNEE ARTHROSCOPY WITH MEDIAL MENISECTOMY Right 03/28/2016   Procedure: RIGHT KNEE ARTHROSCOPY CHONDROPLASTY WITH MEDIAL MENISCECTOMY;  Surgeon: [name], MD;  Location: [facility];  Service: Orthopedics;  Laterality: Right;  . TRIGGER FINGER RELEASE     thumb, middle finger   . TUBAL LIGATION     Social History   Socioeconomic History  . Marital status: Married    Spouse name: Not on file  . Number of  children: 1  . Years of education: Not on file  . Highest education level: Not on file  Occupational History  . Occupation: Retired  Tobacco Use  . Smoking status: Never Smoker  . Smokeless tobacco: Never Used  Vaping Use  . Vaping Use: Never used  Substance and Sexual Activity  . Alcohol use: Yes    Alcohol/week: 1.0 standard drink    Types: 1 Glasses of wine per week    Comment: occasionally  . Drug use: No  . Sexual activity: Never  Other Topics Concern  . Not on file  Social History Narrative  . Not on file   Social Determinants of Health   Financial Resource Strain:   . Difficulty of Paying Living Expenses: Not on file  Food Insecurity:   . Worried About Charity fundraiser in the Last Year: Not on file  . Ran Out of Food in the Last Year: Not on file  Transportation Needs:   . Lack of Transportation (Medical): Not on file  . Lack of Transportation (Non-Medical): Not on file  Physical Activity:   . Days of Exercise per Week: Not on file  . Minutes of Exercise per Session: Not on file  Stress:   . Feeling of Stress :  Not on file  Social Connections:   . Frequency of Communication with Friends and Family: Not on file  . Frequency of Social Gatherings with Friends and Family: Not on file  . Attends Religious Services: Not on file  . Active Member of Clubs or Organizations: Not on file  . Attends Archivist Meetings: Not on file  . Marital Status: Not on file   Allergies  Allergen Reactions  . Sulfa Antibiotics Rash   Family History  Problem Relation Age of Onset  . Colon cancer Father   . Stomach cancer Father   . Diabetes Mother   . Hypertension Mother   . CVA Mother   . Kidney disease Mother   . Aneurysm Mother   . Colon cancer Maternal Uncle   . Breast cancer Maternal Aunt   . Kidney disease Maternal Aunt   . Heart disease Brother   . Throat cancer Maternal Uncle   . Rectal cancer Neg Hx   . Esophageal cancer Neg Hx      Current  Outpatient Medications (Cardiovascular):  .  rosuvastatin (CRESTOR) 5 MG tablet, Take 5 mg by mouth daily. Marland Kitchen  triamterene-hydrochlorothiazide (MAXZIDE-25) 37.5-25 MG per tablet, Take 1 tablet by mouth daily.  Current Outpatient Medications (Respiratory):  .  fexofenadine (ALLEGRA) 180 MG tablet, Take 180 mg by mouth daily.  Current Outpatient Medications (Analgesics):  Marland Kitchen  Acetaminophen (TYLENOL PO), Take by mouth as needed. Marland Kitchen  aspirin 81 MG chewable tablet, Chew 81 mg by mouth daily. .  naproxen sodium (ANAPROX) 220 MG tablet, Take 220 mg by mouth 2 (two) times daily with a meal.   Current Outpatient Medications (Other):  .  cholecalciferol (VITAMIN D) 1000 UNITS tablet, Take 2,000 Units by mouth daily. .  cyclobenzaprine (FLEXERIL) 10 MG tablet, Take 10 mg by mouth 3 (three) times daily as needed. For headache .  Diclofenac Sodium (PENNSAID) 2 % SOLN, Place onto the skin. .  Multiple Vitamin (MULTIVITAMIN WITH MINERALS) TABS, Take 1 tablet by mouth daily. .  Na Sulfate-K Sulfate-Mg Sulf (SUPREP BOWEL PREP KIT) 17.5-3.13-1.6 GM/177ML SOLN, Suprep as directed, no substitutions .  OVER THE COUNTER MEDICATION, OTC laxative- unsure which kind- uses almost daily   Reviewed prior external information including notes and imaging from  primary care provider As well as notes that were available from care everywhere and other healthcare systems.  Past medical history, social, surgical and family history all reviewed in electronic medical record.  No pertanent information unless stated regarding to the chief complaint.   Review of Systems:  No headache, visual changes, nausea, vomiting, diarrhea, constipation, dizziness, abdominal pain, skin rash, fevers, chills, night sweats, weight loss, swollen lymph nodes, body aches, joint swelling, chest pain, shortness of breath, mood changes. POSITIVE muscle aches  Objective  Blood pressure 126/80, pulse 87, height _0  (1.575 m), weight 183 lb (83 kg),  SpO2 97 %.   General: No apparent distress alert and oriented x3 mood and affect normal, dressed appropriately.   After informed written and verbal consent, patient was seated on exam table. Right knee was prepped with alcohol swab and utilizing anterolateral approach, patient's right knee space was injected with15 mg/2.5 mL of Orthovisc(sodium hyaluronate) in a prefilled syringe was injected easily into the knee through a 22-gauge needle..Patient tolerated the procedure well without immediate complications.  After informed written and verbal consent, patient was seated on exam table. Left knee was prepped with alcohol swab and utilizing anterolateral approach, patient's left knee space  was injected with15 mg/2.5 mL of Orthovisc(sodium hyaluronate) in a prefilled syringe was injected easily into the knee through a 22-gauge needle..Patient tolerated the procedure well without immediate complications.     Impression and Recommendations:     The above documentation has been reviewed and is accurate and complete Lyndal Pulley, DO       Note: This dictation was prepared with Dragon dictation along with smaller phrase technology. Any transcriptional errors that result from this process are unintentional.

## 2020-06-08 ENCOUNTER — Encounter: Payer: Self-pay | Admitting: Family Medicine

## 2020-06-08 ENCOUNTER — Ambulatory Visit (INDEPENDENT_AMBULATORY_CARE_PROVIDER_SITE_OTHER): Payer: Federal, State, Local not specified - PPO | Admitting: Family Medicine

## 2020-06-08 ENCOUNTER — Other Ambulatory Visit: Payer: Self-pay

## 2020-06-08 DIAGNOSIS — M170 Bilateral primary osteoarthritis of knee: Secondary | ICD-10-CM

## 2020-06-08 NOTE — Assessment & Plan Note (Signed)
Fourth and final viscosupplementation given today, patient knows of what signs and symptoms to look out for otherwise can follow-up with me more on an as-needed basis.

## 2020-06-08 NOTE — Patient Instructions (Signed)
We are done! See me again in 2 months

## 2020-06-21 ENCOUNTER — Ambulatory Visit: Payer: Federal, State, Local not specified - PPO | Admitting: Family Medicine

## 2020-06-23 ENCOUNTER — Telehealth: Payer: Self-pay | Admitting: Family Medicine

## 2020-06-23 NOTE — Telephone Encounter (Signed)
Patient called stating that after her injections, she has noticed she only has pain in the morning. She asked if Dr Tamala Julian would recommend anything for her try or send in a prescription?

## 2020-06-26 NOTE — Telephone Encounter (Signed)
That sounds like an improvement.  I would say voltaren gel in AM to start and then if not better we will send something in

## 2020-06-26 NOTE — Telephone Encounter (Signed)
Spoke with patient per recommendation.  °

## 2020-07-24 ENCOUNTER — Other Ambulatory Visit: Payer: Self-pay

## 2020-07-24 ENCOUNTER — Ambulatory Visit: Payer: Self-pay

## 2020-07-24 ENCOUNTER — Ambulatory Visit (INDEPENDENT_AMBULATORY_CARE_PROVIDER_SITE_OTHER): Payer: Medicare Other | Admitting: Family Medicine

## 2020-07-24 ENCOUNTER — Encounter: Payer: Self-pay | Admitting: Family Medicine

## 2020-07-24 VITALS — BP 120/78 | HR 89 | Ht 62.0 in | Wt 184.0 lb

## 2020-07-24 DIAGNOSIS — R202 Paresthesia of skin: Secondary | ICD-10-CM

## 2020-07-24 DIAGNOSIS — M25512 Pain in left shoulder: Secondary | ICD-10-CM

## 2020-07-24 DIAGNOSIS — M7552 Bursitis of left shoulder: Secondary | ICD-10-CM

## 2020-07-24 MED ORDER — GABAPENTIN 300 MG PO CAPS
300.0000 mg | ORAL_CAPSULE | Freq: Three times a day (TID) | ORAL | 3 refills | Status: AC | PRN
Start: 2020-07-24 — End: ?

## 2020-07-24 MED ORDER — PREDNISONE 50 MG PO TABS
50.0000 mg | ORAL_TABLET | Freq: Every day | ORAL | 0 refills | Status: DC
Start: 2020-07-24 — End: 2020-08-16

## 2020-07-24 NOTE — Progress Notes (Signed)
Dawn Dawn [name], am serving as a scribe for Dr. Lynne Leader This visit occurred during the SARS-CoV-2 public health emergency.  Safety protocols were in place, including screening questions prior to the visit, additional usage of staff PPE, and extensive cleaning of exam room while observing appropriate contact time as indicated for disinfecting solutions.   Dawn Dawn [name] is a 64 y.o. female who presents to [facility] today for shoulder pain.  She was last seen by Dr. Tamala Julian on 06/08/20 for the final round of B knee Orthovisc injections and previously in July 2021 for R-sided cervical pain.  Since her last visit, pt reports that for the past 2 weeks she has had pain throughout left shoulder joint. States that she has tingling in all fingers but not her thumb. Denies falling or hitting shoulder. Has tried NSAIDs for pain relief.   Is also complaining for bilateral thumb pain in thenar eminence.   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   Exam:  BP 120/78   Pulse 89   Ht 5\' 2"  (1.575 m)   Wt 184 lb (83.5 kg)   SpO2 97%   BMI 33.65 kg/m  General: Well Developed, well nourished, and in Dawn [name] acute distress.   MSK: C-spine normal-appearing nontender normal motion. Left shoulder normal-appearing nontender. Decreased range of motion abduction limited to 90 degrees external rotation full internal rotation lumbar spine. Strength diminished abduction 4/5.  Diminished external rotation 4/5.  Internal rotation strength intact 5/5. Pulses cap refill and sensation are intact distally.  Distal elbow grip and hand strength intact. Positive Tinel's left wrist at carpal tunnel. Negative Tinel's left cubital tunnel.    Lab and Radiology Results Procedure: Real-time Ultrasound Guided  Injection of left shoulder subacromial bursa Device: Philips Affiniti 50G Images permanently stored and available for review in PACS Ultrasound examination prior to injection. Hypoechoic fluid surrounds biceps tendon within tendon sheath. Intact rotator cuff tendons without visible tear.  Subacromial bursitis change present.   AC DJD present. Verbal informed consent obtained.  Discussed risks and benefits of procedure. Warned about infection bleeding damage to structures skin hypopigmentation and fat atrophy among others. Patient expresses understanding and agreement Time-out conducted.   Noted Dawn [name] overlying erythema, induration, or other signs of local infection.   Skin prepped in a sterile fashion.   Local anesthesia: Topical Ethyl chloride.   With sterile technique and under real time ultrasound guidance:  40 mg of Kenalog and 2 mL of Marcaine injected into subacromial bursa. Fluid seen entering the bursa.   Completed without difficulty   Pain moderately resolved suggesting accurate placement of the medication.   Advised to call if fevers/chills, erythema, induration, drainage, or persistent bleeding.   Images permanently stored and available for review in the ultrasound unit.  Impression: Technically successful ultrasound guided injection.    Assessment and Plan: 64 y.o. female with left shoulder pain.  Pain thought to be multifactorial.  The pain in the lateral upper arm is very likely due to subacromial bursitis and rotator cuff tendinitis.  However she has pain radiating below the level of the elbow into the second third fourth and fifth digits of the hand.  This does not correspond to any one dermatomal pattern.  Likely to be cervical radiculopathy.  Plan for subacromial injection and trial of gabapentin.  I have prescribed prednisone to take if not improved with injection.  Additionally would consider physical therapy.  She has a follow-up appointment scheduled with Dr. Tamala Julian for  November 9 which would be an opportunity to reassess pain.  Contact me sooner if needed.   PDMP not reviewed this encounter. Orders Placed This Encounter  Procedures  . [place] LIMITED JOINT SPACE STRUCTURES UP LEFT(Dawn [name] LINKED CHARGES)    Order Specific Question:   Reason for Exam (SYMPTOM  OR DIAGNOSIS REQUIRED)    Answer:   eval left shouder    Order Specific Question:   Preferred imaging location?    Answer:   [place]   Meds ordered this encounter  Medications  . gabapentin (NEURONTIN) 300 MG capsule    Sig: Take 1 capsule (300 mg total) by mouth 3 (three) times daily as needed.    Dispense:  90 capsule    Refill:  3  . predniSONE (DELTASONE) 50 MG tablet    Sig: Take 1 tablet (50 mg total) by mouth daily.    Dispense:  5 tablet    Refill:  0     Discussed warning signs or symptoms. Please see discharge instructions. Patient expresses understanding.   The above documentation has been reviewed and is accurate and complete Lynne Leader, M.D.

## 2020-07-24 NOTE — Patient Instructions (Addendum)
Thank you for coming in today.  Call or go to the ER if you develop a large red swollen joint with extreme pain or oozing puss.   Take gabapentin as needed mostly at bedtime for nerve pain.   Take the prednisone daily for 5 days if you do not have significant benefit from the shot.   Next step if not better is physical therapy. Let me know and I will put an order in.   Follow up with Dr Tamala Julian as scheduled on Nov 9th.

## 2020-08-07 NOTE — Progress Notes (Signed)
[name] [address] [place] [address] Phone: [phone] Subjective:   [name], am serving as a scribe for Dr. Antoine Primas. This visit occurred during the SARS-CoV-2 public health emergency.  Safety protocols were in place, including screening questions prior to the visit, additional usage of staff PPE, and extensive cleaning of exam room while observing appropriate contact time as indicated for disinfecting solutions.   I'm seeing this patient by the request  of:  Maurice Small, MD  CC: Allover pain mostly of the upper extremities  [id number]   06/08/2020 Fourth and final viscosupplementation given today, patient knows of what signs and symptoms to look out for otherwise can follow-up with me more on an as-needed basis.  Update 08/08/2020 KARALYNE NUSSER is a 64 y.o. female coming in with complaint of bilateral knee pain. Patient states that she still does have some pain and stiffness but not as bad as it was before. Patient wears brace on left knee for support.   Patient is also having pain in bilateral thenar eminences. Had injection in shoulder which decreased her pain. Patient sleeps in [employer] and states that she pushes up with hands which may contribute to pain.        Past Medical History:  Diagnosis Date  . Abnormal glucose   . Allergic rhinitis   . Allergy   . Anxiety    no per pt  . Arthritis    bilateral knees- get cortisone injections  . Asthma    as a child  . Carpal tunnel syndrome    right  . Cervical dysplasia    1996  . Endometriosis   . Fibroids   . Hemorrhoids   . High cholesterol   . Hyperlipemia   . Hypertension   . Migraine   . Neck pain   . Stroke [facility]) 2014   TIA- negative work up in ED 2014  . Transient cerebral ischemia    10-22-12 ED, tia s/s but negative work up in ED. see note    Past Surgical History:  Procedure Laterality Date  . ABDOMINAL HYSTERECTOMY  Partial  .  CARPAL TUNNEL RELEASE Right   . cold knife cone     for dysplasia  . COLONOSCOPY     last 11-2008 w/patterson, hems only   . KNEE ARTHROSCOPY WITH MEDIAL MENISECTOMY Right 03/28/2016   Procedure: RIGHT KNEE ARTHROSCOPY CHONDROPLASTY WITH MEDIAL MENISCECTOMY;  Surgeon: Loreta Ave, MD;  Location: [facility];  Service: Orthopedics;  Laterality: Right;  . TRIGGER FINGER RELEASE     thumb, middle finger   . TUBAL LIGATION     Social History   Socioeconomic History  . Marital status: Married    Spouse name: Not on file  . Number of children: 1  . Years of education: Not on file  . Highest education level: Not on file  Occupational History  . Occupation: Retired  Tobacco Use  . Smoking status: Never Smoker  . Smokeless tobacco: Never Used  Vaping Use  . Vaping Use: Never used  Substance and Sexual Activity  . Alcohol use: Yes    Alcohol/week: 1.0 standard drink    Types: 1 Glasses of wine per week    Comment: occasionally  . Drug use: No  . Sexual activity: Never  Other Topics Concern  . Not on file  Social History Narrative  . Not on file   Social Determinants of Health   Financial  Resource Strain:   . Difficulty of Paying Living Expenses: Not on file  Food Insecurity:   . Worried About Charity fundraiser in the Last Year: Not on file  . Ran Out of Food in the Last Year: Not on file  Transportation Needs:   . Lack of Transportation (Medical): Not on file  . Lack of Transportation (Non-Medical): Not on file  Physical Activity:   . Days of Exercise per Week: Not on file  . Minutes of Exercise per Session: Not on file  Stress:   . Feeling of Stress : Not on file  Social Connections:   . Frequency of Communication with Friends and Family: Not on file  . Frequency of Social Gatherings with Friends and Family: Not on file  . Attends Religious Services: Not on file  . Active Member of Clubs or Organizations: Not on file  . Attends Archivist  Meetings: Not on file  . Marital Status: Not on file   Allergies  Allergen Reactions  . Sulfa Antibiotics Rash   Family History  Problem Relation Age of Onset  . Colon cancer Father   . Stomach cancer Father   . Diabetes Mother   . Hypertension Mother   . CVA Mother   . Kidney disease Mother   . Aneurysm Mother   . Colon cancer Maternal Uncle   . Breast cancer Maternal Aunt   . Kidney disease Maternal Aunt   . Heart disease Brother   . Throat cancer Maternal Uncle   . Rectal cancer Neg Hx   . Esophageal cancer Neg Hx     Current Outpatient Medications (Endocrine & Metabolic):  .  predniSONE (DELTASONE) 50 MG tablet, Take 1 tablet (50 mg total) by mouth daily.  Current Outpatient Medications (Cardiovascular):  .  rosuvastatin (CRESTOR) 5 MG tablet, Take 5 mg by mouth daily. Marland Kitchen  triamterene-hydrochlorothiazide (MAXZIDE-25) 37.5-25 MG per tablet, Take 1 tablet by mouth daily.  Current Outpatient Medications (Respiratory):  .  fexofenadine (ALLEGRA) 180 MG tablet, Take 180 mg by mouth daily.  Current Outpatient Medications (Analgesics):  Marland Kitchen  Acetaminophen (TYLENOL PO), Take by mouth as needed. Marland Kitchen  aspirin 81 MG chewable tablet, Chew 81 mg by mouth daily. .  naproxen sodium (ANAPROX) 220 MG tablet, Take 220 mg by mouth 2 (two) times daily with a meal.   Current Outpatient Medications (Other):  .  cholecalciferol (VITAMIN D) 1000 UNITS tablet, Take 2,000 Units by mouth daily. .  cyclobenzaprine (FLEXERIL) 10 MG tablet, Take 10 mg by mouth 3 (three) times daily as needed. For headache .  Diclofenac Sodium (PENNSAID) 2 % SOLN, Place onto the skin. Marland Kitchen  gabapentin (NEURONTIN) 300 MG capsule, Take 1 capsule (300 mg total) by mouth 3 (three) times daily as needed. .  Multiple Vitamin (MULTIVITAMIN WITH MINERALS) TABS, Take 1 tablet by mouth daily. .  Na Sulfate-K Sulfate-Mg Sulf (SUPREP BOWEL PREP KIT) 17.5-3.13-1.6 GM/177ML SOLN, Suprep as directed, no substitutions .  OVER THE  COUNTER MEDICATION, OTC laxative- unsure which kind- uses almost daily   Reviewed prior external information including notes and imaging from  primary care provider As well as notes that were available from care everywhere and other healthcare systems.  Past medical history, social, surgical and family history all reviewed in electronic medical record.  No pertanent information unless stated regarding to the chief complaint.   Review of Systems:  No headache, visual changes, nausea, vomiting, diarrhea, constipation, dizziness, abdominal pain, skin rash, fevers, chills,  night sweats, weight loss, swollen lymph nodes, body aches, joint swelling, chest pain, shortness of breath, mood changes. POSITIVE muscle aches  Objective  Blood pressure 122/82, pulse 88, height $RemoveBe'5\' 2"'[id number]$  (1.575 m), weight 183 lb (83 kg), SpO2 96 %.   General: No apparent distress alert and oriented x3 mood and affect normal, dressed appropriately.  HEENT: Pupils equal, extraocular movements intact  Respiratory: Patient's speak in full sentences and does not appear short of breath  Cardiovascular: No lower extremity edema, non tender, no erythema  Neck exam does have some mild loss of lordosis.  Some tightness in the shoulder girdle bilaterally.  5-5 strength of the upper extremities with testing.  Deep tendon reflexes do appear to be intact Patient's thenar eminence does have good muscle strength noted.  Negative grind test.  Patient does have trigger nodules noted left greater than right at the A2 pulley bilaterally.  Good grip strength at the moment. Patient though does seem to be diffusely tender otherwise. Knee exam not as tender as previously in the medial compartment.    Impression and Recommendations:     The above documentation has been reviewed and is accurate and complete Lyndal Pulley, DO

## 2020-08-08 ENCOUNTER — Other Ambulatory Visit: Payer: Self-pay

## 2020-08-08 ENCOUNTER — Ambulatory Visit (INDEPENDENT_AMBULATORY_CARE_PROVIDER_SITE_OTHER): Payer: Medicare Other | Admitting: Family Medicine

## 2020-08-08 ENCOUNTER — Encounter: Payer: Self-pay | Admitting: Family Medicine

## 2020-08-08 VITALS — BP 122/82 | HR 88 | Ht 62.0 in | Wt 183.0 lb

## 2020-08-08 DIAGNOSIS — M2550 Pain in unspecified joint: Secondary | ICD-10-CM

## 2020-08-08 DIAGNOSIS — M79644 Pain in right finger(s): Secondary | ICD-10-CM | POA: Diagnosis not present

## 2020-08-08 DIAGNOSIS — R6889 Other general symptoms and signs: Secondary | ICD-10-CM

## 2020-08-08 DIAGNOSIS — M899 Disorder of bone, unspecified: Secondary | ICD-10-CM

## 2020-08-08 DIAGNOSIS — M79645 Pain in left finger(s): Secondary | ICD-10-CM

## 2020-08-08 DIAGNOSIS — M170 Bilateral primary osteoarthritis of knee: Secondary | ICD-10-CM

## 2020-08-08 LAB — CBC WITH DIFFERENTIAL/PLATELET
Basophils Absolute: 0 K/uL (ref 0.0–0.1)
Basophils Relative: 0.8 % (ref 0.0–3.0)
Eosinophils Absolute: 0.1 K/uL (ref 0.0–0.7)
Eosinophils Relative: 1.9 % (ref 0.0–5.0)
HCT: 39.3 % (ref 36.0–46.0)
Hemoglobin: 13 g/dL (ref 12.0–15.0)
Lymphocytes Relative: 32 % (ref 12.0–46.0)
Lymphs Abs: 1.9 K/uL (ref 0.7–4.0)
MCHC: 32.9 g/dL (ref 30.0–36.0)
MCV: 81.7 fl (ref 78.0–100.0)
Monocytes Absolute: 0.5 K/uL (ref 0.1–1.0)
Monocytes Relative: 8.9 % (ref 3.0–12.0)
Neutro Abs: 3.4 K/uL (ref 1.4–7.7)
Neutrophils Relative %: 56.4 % (ref 43.0–77.0)
Platelets: 222 K/uL (ref 150.0–400.0)
RBC: 4.82 Mil/uL (ref 3.87–5.11)
RDW: 14.7 % (ref 11.5–15.5)
WBC: 6 K/uL (ref 4.0–10.5)

## 2020-08-08 LAB — COMPREHENSIVE METABOLIC PANEL WITH GFR
ALT: 12 U/L (ref 0–35)
AST: 11 U/L (ref 0–37)
Albumin: 3.9 g/dL (ref 3.5–5.2)
Alkaline Phosphatase: 70 U/L (ref 39–117)
BUN: 14 mg/dL (ref 6–23)
CO2: 29 meq/L (ref 19–32)
Calcium: 9.3 mg/dL (ref 8.4–10.5)
Chloride: 105 meq/L (ref 96–112)
Creatinine, Ser: 0.77 mg/dL (ref 0.40–1.20)
GFR: 81.18 mL/min (ref >60.00)
Glucose, Bld: 91 mg/dL (ref 70–99)
Potassium: 3.9 meq/L (ref 3.5–5.1)
Sodium: 139 meq/L (ref 135–145)
Total Bilirubin: 0.5 mg/dL (ref 0.2–1.2)
Total Protein: 6.7 g/dL (ref 6.0–8.3)

## 2020-08-08 LAB — IBC PANEL
Iron: 98 ug/dL (ref 42–145)
Saturation Ratios: 25.2 % (ref 20.0–50.0)
Transferrin: 278 mg/dL (ref 212.0–360.0)

## 2020-08-08 LAB — VITAMIN D 25 HYDROXY (VIT D DEFICIENCY, FRACTURES): VITD: 25.5 ng/mL — ABNORMAL LOW (ref 30.00–100.00)

## 2020-08-08 LAB — SEDIMENTATION RATE: Sed Rate: 26 mm/hr (ref 0–30)

## 2020-08-08 LAB — VITAMIN B12: Vitamin B-12: 226 pg/mL (ref 211–911)

## 2020-08-08 NOTE — Assessment & Plan Note (Signed)
Patient does state that she does have some neck pain that seems to be out of proportion.  Patient does have bilateral arm pain.  Could be more of the radiculopathy but is on gabapentin at a fairly high dose.  Laboratory work-up otherwise ordered today to further evaluate with patient having some signs that could be consistent with a autoimmune disease previously.  Follow-up with me know otherwise in 4 to 6 weeks.

## 2020-08-08 NOTE — Patient Instructions (Signed)
Braces at night Heat and massage during the day Scapular exercises Ice more for the neck See me again in two months

## 2020-08-08 NOTE — Assessment & Plan Note (Signed)
Bilateral thumb pain.  Patient has had known arthritic changes of the neck.  I do believe the patient actually has some trigger nodules left greater than right.  Bracing at night, discussed home exercises, discussed which activities to do which wants to avoid.  Patient is to increase activity slowly.  If any worsening pain we can consider injections.  May need to consider also nerve conduction studies if this continues to give patient's difficulty and see if any of it is secondary to her cervical radiculopathy.

## 2020-08-08 NOTE — Assessment & Plan Note (Signed)
Improvement after the viscosupplementation.  No significant change otherwise at this time.

## 2020-08-09 LAB — ANA: Anti Nuclear Antibody (ANA): NEGATIVE

## 2020-08-09 LAB — ANGIOTENSIN CONVERTING ENZYME: Angiotensin-Converting Enzyme: 26 U/L (ref 9–67)

## 2020-08-16 ENCOUNTER — Ambulatory Visit (INDEPENDENT_AMBULATORY_CARE_PROVIDER_SITE_OTHER): Payer: Medicare Other | Admitting: Family Medicine

## 2020-08-16 ENCOUNTER — Encounter: Payer: Self-pay | Admitting: Family Medicine

## 2020-08-16 ENCOUNTER — Ambulatory Visit: Payer: Self-pay

## 2020-08-16 ENCOUNTER — Other Ambulatory Visit: Payer: Self-pay

## 2020-08-16 VITALS — BP 148/98 | HR 102 | Ht 62.0 in | Wt 185.2 lb

## 2020-08-16 DIAGNOSIS — M79601 Pain in right arm: Secondary | ICD-10-CM

## 2020-08-16 NOTE — Patient Instructions (Addendum)
You had a R shoulder injection today.  Call or go to the ER if you develop a large red swollen joint with extreme pain or oozing puss.   If the arm pain does not improve try the gabapentin that we prescribed last time.    Recheck if not improved.

## 2020-08-16 NOTE — Progress Notes (Signed)
I, Wendy Poet, LAT, ATC, am serving as scribe for Dr. Lynne Leader.  Dawn [name] is a 64 y.o. female who presents to [place] at [facility] today for R shoulder pain.  She was last seen by Dr.Smith on 08/08/20 for B knee pain and B hand pain and before that by Dr. Georgina Snell on 07/24/20 for L shoulder pain and had a L subacromial injection.  Since her last visit w/ Dr. Tamala Julian, pt reports that she is having R shoulder, upper trap and R arm pain that runs all the way to her hand.  She locates her arm pain to her R anterior arm that travels into her R thumb and webspace.  She reports tingling in her R fingertips.  Aggravating factors include R shoulder AROM.  She denies any neck pain.  She has tried Flexeril and Tylenol.  She had great results with contralateral left shoulder injection with similar symptoms last month and would like to try shoulder injection today.  Diagnostic testing: C-spine MRI- 05/05/20; c-spine XR- 04/11/20  Pertinent review of systems: No fevers or chills  Relevant historical information: Polyarthralgia history   Exam:  BP (!) 148/98 (BP Location: Right Arm, Patient Position: Sitting, Cuff Size: Normal)   Pulse (!) 102   Ht 5\' 2"  (1.575 m)   Wt 185 lb 3.2 oz (84 kg)   SpO2 97%   BMI 33.87 kg/m  General: Well Developed, well nourished, and in no acute distress.   MSK:  C-spine normal-appearing nontender. Decreased cervical motion. Reflexes intact bilateral upper extremities. Strength decreased right shoulder abduction 4/5 compared to left.  Otherwise upper extremity strength testing normal.  Right shoulder exam normal-appearing Not particular tender. Decreased range of motion abduction limited to 120 degrees.  Full external rotation.  Internal rotation limited to lumbar spine. Strength reduced strength abduction testing 4/5.  Normal external and internal rotation strength testing. Positive Hawkins and Neer's test.    Lab and Radiology  Results  Procedure: Real-time Ultrasound Guided Injection of right shoulder subacromial bursa Device: Philips Affiniti 50G Images permanently stored and available for review in PACS Verbal informed consent obtained.  Discussed risks and benefits of procedure. Warned about infection bleeding damage to structures skin hypopigmentation and fat atrophy among others. Patient expresses understanding and agreement Time-out conducted.   Noted no overlying erythema, induration, or other signs of local infection.   Skin prepped in a sterile fashion.   Local anesthesia: Topical Ethyl chloride.   With sterile technique and under real time ultrasound guidance:  40 mg of Kenalog and 2 mL of Marcaine injected into subacromial bursa. Fluid seen entering the bursa.   Completed without difficulty   Pain immediately resolved suggesting accurate placement of the medication.   Advised to call if fevers/chills, erythema, induration, drainage, or persistent bleeding.   Images permanently stored and available for review in the ultrasound unit.  Impression: Technically successful ultrasound guided injection.    EXAM: MRI CERVICAL SPINE WITHOUT CONTRAST  TECHNIQUE: Multiplanar, multisequence MR imaging of the cervical spine was performed. No intravenous contrast was administered.  COMPARISON:  2015  FINDINGS: Motion artifact is present.  Alignment: Stable with reversal of cervical lordosis and mild degenerative listhesis, for example at C5-C6.  Vertebrae: Stable vertebral body heights. No substantial marrow edema. No suspicious osseous lesion  Cord: No abnormal signal.  Posterior Fossa, vertebral arteries, paraspinal tissues: Unremarkable.  Disc levels:  C2-C3:  No canal or foraminal stenosis.  C3-C4: Disc bulge with endplate osteophytes  and right uncovertebral hypertrophy. No canal or left foraminal stenosis. Minor right foraminal stenosis.  C4-C5: Disc bulge, endplate  osteophytes, and uncovertebral hypertrophy. No canal or right foraminal stenosis. Minor left foraminal stenosis.  C5-C6: Disc bulge with superimposed central disc protrusion, endplate osteophytes, and uncovertebral hypertrophy. Mild canal stenosis. Moderate foraminal stenosis.  C6-C7: Disc bulge, endplate osteophytes, and uncovertebral hypertrophy. Minor canal stenosis. Mild to moderate right and moderate left foraminal stenosis.  C7-T1:  No canal or foraminal stenosis.  IMPRESSION: Multilevel degenerative changes as detailed above without substantial progression since 2015. Stenosis evaluation is suboptimal due to motion artifact.   Electronically Signed   By: Macy Mis M.D.   On: 05/05/2020 10:04 I, Lynne Leader, personally (independently) visualized and performed the interpretation of the images attached in this note. Moderate foraminal stenosis C5-C6     Assessment and Plan: 64 y.o. female with right shoulder pain multifactorial.  Patient has symptoms consistent with both subacromial bursitis/rotator cuff tendinopathy as well as cervical radiculopathy at C5 dermatomal pattern.  Plan for subacromial injection today.  This will likely help a large portion of her symptoms.  If she continues to experience significant pain especially pain radiating down her arm or with resulting weakness she may benefit from epidural steroid injection or further evaluation for her cervical radiculopathy.  She certainly does have some pathology at the C4-C5 and C5-C6 level on cervical spine MRI from August of this year.     PDMP not reviewed this encounter. Orders Placed This Encounter  Procedures  . [place] LIMITED JOINT SPACE STRUCTURES UP RIGHT(NO LINKED CHARGES)    Order Specific Question:   Reason for Exam (SYMPTOM  OR DIAGNOSIS REQUIRED)    Answer:   R shoulder pain    Order Specific Question:   Preferred imaging location?    Answer:   [facility]   No  orders of the defined types were placed in this encounter.    Discussed warning signs or symptoms. Please see discharge instructions. Patient expresses understanding.   The above documentation has been reviewed and is accurate and complete Lynne Leader, M.D.

## 2020-09-20 ENCOUNTER — Ambulatory Visit: Payer: Medicare Other | Admitting: Family Medicine

## 2020-10-10 ENCOUNTER — Ambulatory Visit: Payer: Medicare Other | Admitting: Family Medicine

## 2020-10-25 ENCOUNTER — Other Ambulatory Visit: Payer: Self-pay

## 2020-10-25 ENCOUNTER — Ambulatory Visit (INDEPENDENT_AMBULATORY_CARE_PROVIDER_SITE_OTHER): Payer: Medicare Other | Admitting: Family Medicine

## 2020-10-25 ENCOUNTER — Encounter: Payer: Self-pay | Admitting: Family Medicine

## 2020-10-25 DIAGNOSIS — M170 Bilateral primary osteoarthritis of knee: Secondary | ICD-10-CM | POA: Diagnosis not present

## 2020-10-25 NOTE — Progress Notes (Signed)
[place] Phone: [phone] Subjective:   Dawn [name], am serving as a scribe for Dr. Hulan Saas. This visit occurred during the SARS-CoV-2 public health emergency.  Safety protocols were in place, including screening questions prior to the visit, additional usage of staff PPE, and extensive cleaning of exam room while observing appropriate contact time as indicated for disinfecting solutions.   I'm seeing this patient by the request  of:  Kelton Pillar, MD  CC: Knee pain follow-up  ASN:[id number]   08/08/2020 Patient does state that she does have some neck pain that seems to be out of proportion.  Patient does have bilateral arm pain.  Could be more of the radiculopathy but is on gabapentin at a fairly high dose.  Laboratory work-up otherwise ordered today to further evaluate with patient having some signs that could be consistent with a autoimmune disease previously.  Follow-up with me know otherwise in 4 to 6 weeks.  Improvement after the viscosupplementation.  [name] significant change otherwise at this time.  Bilateral thumb pain.  Patient has had known arthritic changes of the neck.  I do believe the patient actually has some trigger nodules left greater than right.  Bracing at night, discussed home exercises, discussed which activities to do which wants to avoid.  Patient is to increase activity slowly.  If any worsening pain we can consider injections.  May need to consider also nerve conduction studies if this continues to give patient's difficulty and see if any of it is secondary to her cervical radiculopathy.  Update 10/25/2020 Dawn [name] is a 65 y.o. female coming in with complaint of neck/arm pain and bilateral knee pain. Knees are bothering her today. Having a hard time flexing right knee. History of right knee mensicus tear/surgery. [name] new injury.   Patient continues to have thumb pain with  grasping items.   Had bilateral shoulder injections that helped to alleviate her pain.   Patient finished Orthovisc in September 2021.   Patient does have moderate to severe medial compartment arthritic changes of the knees bilaterally seen on x-rays in July 2021.  Past Medical History:  Diagnosis Date  . Abnormal glucose   . Allergic rhinitis   . Allergy   . Anxiety    [name] per pt  . Arthritis    bilateral knees- get cortisone injections  . Asthma    as a child  . Carpal tunnel syndrome    right  . Cervical dysplasia    1996  . Endometriosis   . Fibroids   . Hemorrhoids   . High cholesterol   . Hyperlipemia   . Hypertension   . Migraine   . Neck pain   . Stroke [facility]) 2014   TIA- negative work up in ED 2014  . Transient cerebral ischemia    10-22-12 ED, tia s/s but negative work up in ED. see note    Past Surgical History:  Procedure Laterality Date  . ABDOMINAL HYSTERECTOMY  Partial  . CARPAL TUNNEL RELEASE Right   . cold knife cone     for dysplasia  . COLONOSCOPY     last 11-2008 w/patterson, hems only   . KNEE ARTHROSCOPY WITH MEDIAL MENISECTOMY Right 03/28/2016   Procedure: RIGHT KNEE ARTHROSCOPY CHONDROPLASTY WITH MEDIAL MENISCECTOMY;  Surgeon: [name], MD;  Location: [place];  Service: Orthopedics;  Laterality: Right;  . TRIGGER FINGER RELEASE     thumb,  middle finger   . TUBAL LIGATION     Social History   Socioeconomic History  . Marital status: Married    Spouse name: Not on file  . Number of children: 1  . Years of education: Not on file  . Highest education level: Not on file  Occupational History  . Occupation: Retired  Tobacco Use  . Smoking status: Never Smoker  . Smokeless tobacco: Never Used  Vaping Use  . Vaping Use: Never used  Substance and Sexual Activity  . Alcohol use: Yes    Alcohol/week: 1.0 standard drink    Types: 1 Glasses of wine per week    Comment: occasionally  . Drug use: [name]  . Sexual  activity: Never  Other Topics Concern  . Not on file  Social History Narrative  . Not on file   Social Determinants of Health   Financial Resource Strain: Not on file  Food Insecurity: Not on file  Transportation Needs: Not on file  Physical Activity: Not on file  Stress: Not on file  Social Connections: Not on file   Allergies  Allergen Reactions  . Sulfa Antibiotics Rash   Family History  Problem Relation Age of Onset  . Colon cancer Father   . Stomach cancer Father   . Diabetes Mother   . Hypertension Mother   . CVA Mother   . Kidney disease Mother   . Aneurysm Mother   . Colon cancer Maternal Uncle   . Breast cancer Maternal Aunt   . Kidney disease Maternal Aunt   . Heart disease Brother   . Throat cancer Maternal Uncle   . Rectal cancer Neg Hx   . Esophageal cancer Neg Hx      Current Outpatient Medications (Cardiovascular):  .  rosuvastatin (CRESTOR) 5 MG tablet, Take 5 mg by mouth daily. Marland Kitchen  triamterene-hydrochlorothiazide (MAXZIDE-25) 37.5-25 MG per tablet, Take 1 tablet by mouth daily.  Current Outpatient Medications (Respiratory):  .  fexofenadine (ALLEGRA) 180 MG tablet, Take 180 mg by mouth daily.  Current Outpatient Medications (Analgesics):  Marland Kitchen  Acetaminophen (TYLENOL PO), Take by mouth as needed. Marland Kitchen  aspirin 81 MG chewable tablet, Chew 81 mg by mouth daily. .  naproxen sodium (ANAPROX) 220 MG tablet, Take 220 mg by mouth 2 (two) times daily with a meal.   Current Outpatient Medications (Other):  .  cholecalciferol (VITAMIN D) 1000 UNITS tablet, Take 2,000 Units by mouth daily. .  cyclobenzaprine (FLEXERIL) 10 MG tablet, Take 10 mg by mouth 3 (three) times daily as needed. For headache .  Diclofenac Sodium 2 % SOLN, Place onto the skin. Marland Kitchen  gabapentin (NEURONTIN) 300 MG capsule, Take 1 capsule (300 mg total) by mouth 3 (three) times daily as needed. .  Multiple Vitamin (MULTIVITAMIN WITH MINERALS) TABS, Take 1 tablet by mouth daily. .  Na Sulfate-K  Sulfate-Mg Sulf (SUPREP BOWEL PREP KIT) 17.5-3.13-1.6 GM/177ML SOLN, Suprep as directed, [name] substitutions .  OVER THE COUNTER MEDICATION, OTC laxative- unsure which kind- uses almost daily   Reviewed prior external information including notes and imaging from  primary care provider As well as notes that were available from care everywhere and other healthcare systems.  Past medical history, social, surgical and family history all reviewed in electronic medical record.  [name] pertanent information unless stated regarding to the chief complaint.   Review of Systems:  [name] headache, visual changes, nausea, vomiting, diarrhea, constipation, dizziness, abdominal pain, skin rash, fevers, chills, night sweats, weight loss, swollen lymph  nodes, body aches, chest pain, shortness of breath, mood changes. POSITIVE muscle aches, joint swelling  Objective  Blood pressure 136/88, pulse 96, height $RemoveBe'5\' 2"'TECMipHdz$  (1.575 m), weight 191 lb (86.6 kg), SpO2 96 %.   General: [name] apparent distress alert and oriented x3 mood and affect normal, dressed appropriately.  HEENT: Pupils equal, extraocular movements intact  Respiratory: Patient's speak in full sentences and does not appear short of breath  Cardiovascular: Trace lower extremity edema, non tender, [name] erythema  Gait severely antalgic Knee: Bilateral valgus deformity noted. Large thigh to calf ratio.  Tender to palpation over medial and PF joint line.  ROM decreased on the right side lacking the last 5 degrees of extension in the last 20 degrees of flexion.  Near full still in the left side. instability with valgus force.  painful patellar compression. Patellar glide with moderate crepitus. Patellar and quadriceps tendons unremarkable. Hamstring and quadriceps strength is normal.  After informed written and verbal consent, patient was seated on exam table. Right knee was prepped with alcohol swab and utilizing anterolateral approach, patient's right knee space was  injected with 4:1  marcaine 0.5%: Kenalog $RemoveBefor'40mg'GrwELyoSFuxR$ /dL. Patient tolerated the procedure well without immediate complications.  After informed written and verbal consent, patient was seated on exam table. Left knee was prepped with alcohol swab and utilizing anterolateral approach, patient's left knee space was injected with 4:1  marcaine 0.5%: Kenalog $RemoveBefor'40mg'hZUTLJUZwKnU$ /dL. Patient tolerated the procedure well without immediate complications.    Impression and Recommendations:     The above documentation has been reviewed and is accurate and complete Lyndal Pulley, DO

## 2020-10-25 NOTE — Assessment & Plan Note (Signed)
Patient given bilateral injections today.  Tolerated the procedure well.  Known arthritic changes of the knees bilaterally.  They are fairly severe.  Patient wants to avoid any type of surgical intervention if possible.  We discussed this at great length.  Patient still wants to do these injections and does get some viscosupplementation improvement still.  We will consider getting that again approved and see how patient does follow-up with me again in 2 months

## 2020-10-25 NOTE — Patient Instructions (Signed)
Injected both knees Will get gel approval See me again in 8 weeks

## 2020-12-20 ENCOUNTER — Ambulatory Visit: Payer: Medicare Other | Admitting: Family Medicine

## 2021-01-02 NOTE — Progress Notes (Signed)
[place] Phone: [phone] Subjective:   [name], am serving as a scribe for Dr. Hulan Saas. This visit occurred during the SARS-CoV-2 public health emergency.  Safety protocols were in place, including screening questions prior to the visit, additional usage of staff PPE, and extensive cleaning of exam room while observing appropriate contact time as indicated for disinfecting solutions.   I'm seeing this patient by the request  of:  Kelton Pillar, MD  CC: Knee pain, left shoulder pain  [id number]   10/25/2020 Patient given bilateral injections today.  Tolerated the procedure well.  Known arthritic changes of the knees bilaterally.  They are fairly severe.  Patient wants to avoid any type of surgical intervention if possible.  We discussed this at great length.  Patient still wants to do these injections and does get some viscosupplementation improvement still.  We will consider getting that again approved and see how patient does follow-up with me again in 2 months  Update 01/03/2021 CARLICIA LEAVENS is a 65 y.o. female coming in with complaint of B knee pain. Orthovisc authorized 10/26/2020. L>R. Would like to see a physician in [place] for knee replacement surgery.   L shoulder pain is worse than last visit. Patient having pain in anterior aspect. Entire arm is achy. Would like injection in shoulder today.  Left knee on x-ray previously does have severe narrowing of the left medial compartment.  Moderate narrowing of the right knee.    Past Medical History:  Diagnosis Date  . Abnormal glucose   . Allergic rhinitis   . Allergy   . Anxiety    no per pt  . Arthritis    bilateral knees- get cortisone injections  . Asthma    as a child  . Carpal tunnel syndrome    right  . Cervical dysplasia    1996  . Endometriosis   . Fibroids   . Hemorrhoids   . High cholesterol   . Hyperlipemia   .  Hypertension   . Migraine   . Neck pain   . Stroke [facility] [place] [facility]) 2014   TIA- negative work up in ED 2014  . Transient cerebral ischemia    10-22-12 ED, tia s/s but negative work up in ED. see note    Past Surgical History:  Procedure Laterality Date  . ABDOMINAL HYSTERECTOMY  Partial  . CARPAL TUNNEL RELEASE Right   . cold knife cone     for dysplasia  . COLONOSCOPY     last 11-2008 w/patterson, hems only   . KNEE ARTHROSCOPY WITH MEDIAL MENISECTOMY Right 03/28/2016   Procedure: RIGHT KNEE ARTHROSCOPY CHONDROPLASTY WITH MEDIAL MENISCECTOMY;  Surgeon: [name], MD;  Location: [place];  Service: Orthopedics;  Laterality: Right;  . TRIGGER FINGER RELEASE     thumb, middle finger   . TUBAL LIGATION     Social History   Socioeconomic History  . Marital status: Married    Spouse name: Not on file  . Number of children: 1  . Years of education: Not on file  . Highest education level: Not on file  Occupational History  . Occupation: Retired  Tobacco Use  . Smoking status: Never Smoker  . Smokeless tobacco: Never Used  Vaping Use  . Vaping Use: Never used  Substance and Sexual Activity  . Alcohol use: Yes    Alcohol/week: 1.0 standard drink    Types: 1 Glasses of wine  per week    Comment: occasionally  . Drug use: No  . Sexual activity: Never  Other Topics Concern  . Not on file  Social History Narrative  . Not on file   Social Determinants of Health   Financial Resource Strain: Not on file  Food Insecurity: Not on file  Transportation Needs: Not on file  Physical Activity: Not on file  Stress: Not on file  Social Connections: Not on file   Allergies  Allergen Reactions  . Sulfa Antibiotics Rash   Family History  Problem Relation Age of Onset  . Colon cancer Father   . Stomach cancer Father   . Diabetes Mother   . Hypertension Mother   . CVA Mother   . Kidney disease Mother   . Aneurysm Mother   . Colon cancer Maternal Uncle   . Breast  cancer Maternal Aunt   . Kidney disease Maternal Aunt   . Heart disease Brother   . Throat cancer Maternal Uncle   . Rectal cancer Neg Hx   . Esophageal cancer Neg Hx      Current Outpatient Medications (Cardiovascular):  .  rosuvastatin (CRESTOR) 5 MG tablet, Take 5 mg by mouth daily. Marland Kitchen  triamterene-hydrochlorothiazide (MAXZIDE-25) 37.5-25 MG per tablet, Take 1 tablet by mouth daily.  Current Outpatient Medications (Respiratory):  .  fexofenadine (ALLEGRA) 180 MG tablet, Take 180 mg by mouth daily.  Current Outpatient Medications (Analgesics):  Marland Kitchen  Acetaminophen (TYLENOL PO), Take by mouth as needed. Marland Kitchen  aspirin 81 MG chewable tablet, Chew 81 mg by mouth daily. .  naproxen sodium (ANAPROX) 220 MG tablet, Take 220 mg by mouth 2 (two) times daily with a meal.   Current Outpatient Medications (Other):  .  cholecalciferol (VITAMIN D) 1000 UNITS tablet, Take 2,000 Units by mouth daily. .  cyclobenzaprine (FLEXERIL) 10 MG tablet, Take 10 mg by mouth 3 (three) times daily as needed. For headache .  Diclofenac Sodium 2 % SOLN, Place onto the skin. Marland Kitchen  gabapentin (NEURONTIN) 300 MG capsule, Take 1 capsule (300 mg total) by mouth 3 (three) times daily as needed. .  Multiple Vitamin (MULTIVITAMIN WITH MINERALS) TABS, Take 1 tablet by mouth daily. .  Na Sulfate-K Sulfate-Mg Sulf (SUPREP BOWEL PREP KIT) 17.5-3.13-1.6 GM/177ML SOLN, Suprep as directed, no substitutions .  OVER THE COUNTER MEDICATION, OTC laxative- unsure which kind- uses almost daily   Reviewed prior external information including notes and imaging from  primary care provider As well as notes that were available from care everywhere and other healthcare systems.  Past medical history, social, surgical and family history all reviewed in electronic medical record.  No pertanent information unless stated regarding to the chief complaint.   Review of Systems:  No headache, visual changes, nausea, vomiting, diarrhea, constipation,  dizziness, abdominal pain, skin rash, fevers, chills, night sweats, weight loss, swollen lymph nodes, body aches, joint swelling, chest pain, shortness of breath, mood changes. POSITIVE muscle aches  Objective  There were no vitals taken for this visit.   General: No apparent distress alert and oriented x3 mood and affect normal, dressed appropriately.  HEENT: Pupils equal, extraocular movements intact  Respiratory: Patient's speak in full sentences and does not appear short of breath  Cardiovascular: No lower extremity edema, non tender, no erythema  Gait antalgic Knee: Bilateral valgus deformity noted. Large thigh to calf ratio.  Tender to palpation over medial and PF joint line.  Left greater than right ROM full in flexion and extension and  lower leg rotation. instability with valgus force.  painful patellar compression.  Left greater than right Patellar glide with moderate crepitus.  Left greater than right Patellar and quadriceps tendons unremarkable. Hamstring and quadriceps strength is normal.  Left shoulder exam shows the patient does have positive impingement noted.  Patient does have though good rotator cuff strength noted.  Mild positive crossover.  After informed written and verbal consent, patient was seated on exam table. Left shoulder was prepped with alcohol swab and utilizing posterior approach, patient's right glenohumeral space was injected with 4:1  marcaine 0.5%: Kenalog 4m/dL. Patient tolerated the procedure well without immediate complications.  After informed written and verbal consent, patient was seated on exam table. Left knee was prepped with alcohol swab and utilizing anterolateral approach, patient's left knee space was injected with 4:1  marcaine 0.5%: Kenalog 459mdL. Patient tolerated the procedure well without immediate complications.   Impression and Recommendations:     The above documentation has been reviewed and is accurate and complete ZaLyndal PulleyDO

## 2021-01-03 ENCOUNTER — Ambulatory Visit (INDEPENDENT_AMBULATORY_CARE_PROVIDER_SITE_OTHER): Payer: Medicare Other | Admitting: Family Medicine

## 2021-01-03 ENCOUNTER — Ambulatory Visit (INDEPENDENT_AMBULATORY_CARE_PROVIDER_SITE_OTHER): Payer: Medicare Other

## 2021-01-03 ENCOUNTER — Other Ambulatory Visit: Payer: Self-pay

## 2021-01-03 ENCOUNTER — Encounter: Payer: Self-pay | Admitting: Family Medicine

## 2021-01-03 VITALS — BP 126/94 | HR 94 | Ht 62.0 in | Wt 192.0 lb

## 2021-01-03 DIAGNOSIS — M25561 Pain in right knee: Secondary | ICD-10-CM

## 2021-01-03 DIAGNOSIS — G8929 Other chronic pain: Secondary | ICD-10-CM

## 2021-01-03 DIAGNOSIS — M25512 Pain in left shoulder: Secondary | ICD-10-CM

## 2021-01-03 DIAGNOSIS — M25562 Pain in left knee: Secondary | ICD-10-CM

## 2021-01-03 DIAGNOSIS — M170 Bilateral primary osteoarthritis of knee: Secondary | ICD-10-CM | POA: Diagnosis not present

## 2021-01-03 DIAGNOSIS — M7542 Impingement syndrome of left shoulder: Secondary | ICD-10-CM

## 2021-01-03 NOTE — Patient Instructions (Signed)
Xray today Exercises Ice to shoulder and knees 60min 2x a day Medical Records [phone] See me again in 6 weeks

## 2021-01-04 ENCOUNTER — Encounter: Payer: Self-pay | Admitting: Family Medicine

## 2021-01-04 DIAGNOSIS — M7542 Impingement syndrome of left shoulder: Secondary | ICD-10-CM | POA: Insufficient documentation

## 2021-01-04 NOTE — Assessment & Plan Note (Signed)
Injections given today on the left knee only.  Right knee feels like it is doing relatively well.  Will refer patient to a surgeon of her choice with different intervention call [facility]  We will see how patient does.  Patient knows we are here otherwise.  With her potentially having surgery in [place] then we will do physical therapy here likely with our Ortho care.

## 2021-01-04 NOTE — Assessment & Plan Note (Signed)
Patient given injection today, tolerated the procedure well.  Likely contributing to some of the different aches and pains at this time.  Patient does not have any signs of weakness in the shoulder.  Home exercises given with athletic trainer.  Given injection today.  Patient tolerated the procedure well.  Follow-up again 4 to 8 weeks

## 2021-01-25 ENCOUNTER — Encounter

## 2021-01-25 ENCOUNTER — Telehealth: Admit: 2021-01-25 | Discharge: 2021-01-25 | Payer: MEDICARE | Attending: Orthopaedic Surgery | Primary: Family Medicine

## 2021-01-25 ENCOUNTER — Telehealth: Attending: Orthopaedic Surgery

## 2021-01-25 DIAGNOSIS — M25562 Pain in left knee: Secondary | ICD-10-CM

## 2021-01-25 NOTE — Progress Notes (Signed)
Name: Sharon Sanders    DOB: 1956-09-06     Service Dept: [facility] Orthopaedics and Sports Medicine    Patient's Pharmacies:    [id number] [place], [place] - [address] AT [facility] [address]  [address]  [place] [address]  Phone: [phone] Fax: [phone]       Chief Complaint   Patient presents with   ??? Knee Pain        There were no vitals taken for this visit.   Allergies   Allergen Reactions   ??? Sulfa (Sulfonamide Antibiotics) Rash      Current Outpatient Medications   Medication Sig Dispense Refill   ??? gabapentin (NEURONTIN) 300 mg capsule Take 300 mg by mouth three (3) times daily as needed.     ??? aspirin 81 mg chewable tablet Take 81 mg by mouth daily.     ??? betamethasone valerate (VALISONE) 0.1 % topical cream betamethasone valerate 0.1 % topical cream   APPLY THIN LAYER TOPICALLY TO THE AFFECTED AREA EVERY DAY     ??? fluconazole (DIFLUCAN) 150 mg tablet fluconazole 150 mg tablet   TAKE 1 TABLET BY MOUTH EVERY DAY FOR 1 DAY     ??? cholecalciferol (VITAMIN D3) (1000 Units /25 mcg) tablet Take 2,000 Units by mouth daily.     ??? cyclobenzaprine (FLEXERIL) 10 mg tablet Take 10 mg by mouth three (3) times daily as needed.     ??? diclofenac sodium 20 mg/gram /actuation(2 %) sopm by TransDERmal route.     ??? fexofenadine (ALLEGRA) 180 mg tablet Take 180 mg by mouth daily.     ??? fluticasone propionate (FLONASE) 50 mcg/actuation nasal spray fluticasone propionate 50 mcg/actuation nasal spray,suspension   SHAKE LIQUID AND USE 1 SPRAY IN EACH NOSTRIL EVERY DAY     ??? metroNIDAZOLE (FLAGYL) 500 mg tablet metronidazole 500 mg tablet   TAKE 1 TABLET BY MOUTH TWICE DAILY FOR 7 DAYS     ??? naproxen sodium (NAPROSYN) 220 mg tablet Take 220 mg by mouth two (2) times daily (with meals).     ??? rosuvastatin (CRESTOR) 5 mg tablet rosuvastatin 5 mg tablet   TAKE 1 TABLET BY MOUTH EVERY DAY     ??? triamterene-hydroCHLOROthiazide (MAXZIDE) 37.5-25 mg per tablet  triamterene 37.5 mg-hydrochlorothiazide 25 mg tablet   TAKE 1 TABLET BY MOUTH EVERY DAY IN THE MORNING        There is no problem list on file for this patient.     Family History   Problem Relation Age of Onset   ??? No Known Problems Mother    ??? No Known Problems Father       Social History     Socioeconomic History   ??? Marital status: MARRIED   Tobacco Use   ??? Smoking status: Never Smoker   ??? Smokeless tobacco: Never Used   Vaping Use   ??? Vaping Use: Never used   Substance and Sexual Activity   ??? Alcohol use: Yes     Comment: occ   ??? Drug use: Never   ??? Sexual activity: Not Currently      History reviewed. No pertinent surgical history.   Past Medical History:   Diagnosis Date   ??? High cholesterol    ??? Hypertension         I have reviewed and agree with PFSH and ROS and intake form in chart and the record  furthermore I have reviewed prior medical record(s) regarding this patients care during this appointment.     Review of Systems:   Patient is a pleasant appearing individual, appropriately dressed, well hydrated, well nourished, who is alert, appropriately oriented for age, and in no acute distress with a normal gait and normal affect who does not appear to be in any significant pain.   Physical Exam:  Left Knee -Decrease range of motion with flexion, Knee arc of greater than 50 degrees, Some crepitation, Grossly neurovascularly intact, Good cap refill, No skin lesion, Moderate swelling, No gross instability, Some quadriceps weakness, Kellgren and Lawrence at least grade 3    Right Knee - Full Range of Motion, No crepitation, Grossly neurovascularly intact, Good cap refill, No skin lesion, No swelling, No gross instability, No quadriceps weakness   Encounter Diagnoses     ICD-10-CM ICD-9-CM   1. Left knee pain, unspecified chronicity  M25.562 719.46   2. Osteoarthritis of left knee, unspecified osteoarthritis type  M17.12 715.96       HPI:  The patient is here with a chief complaint of left knee pain, dull,  throbbing pain, progressively getting worse.  Pain is 8/10.  Continues to have difficulty.    X-rays of the left knee are positive for severe OA.    Assessment/Plan:  Plan will be for left total knee replacement.  She will be coming in a day before, PVLs the day before, inpatient stay, general medical clearance.  No cardiac history and no blood clot history, and we will go from there.      As part of continued conservative pain management options the patient was advised to utilize Tylenol or OTC NSAIDS as long as it is not medically contraindicated.     [name], was evaluated through a synchronous (real-time) audio-video encounter. The patient (or guardian if applicable) is aware that this is a billable service. Verbal consent to proceed has been obtained within the past 12 months. The visit was conducted pursuant to the emergency declaration under the [employer] and the [employer], 1135 waiver authority and the [employer] Act.  Patient identification was verified, and a caregiver was present when appropriate. The patient was located in a state where the provider was credentialed to provide care.    Return to Office:   Follow-up and Dispositions    ?? Return for schedule for surgery.           Scribed by Diona Foley, LPN as dictated by [facility] A. Allena Katz, MD.  Documentation True and Accepted Ottie Neglia A. Allena Katz, MD

## 2021-02-20 NOTE — Progress Notes (Signed)
[place] Phone: [phone] Subjective:   [name], am serving as a scribe for Dr. Hulan Saas. This visit occurred during the SARS-CoV-2 public health emergency.  Safety protocols were in place, including screening questions prior to the visit, additional usage of staff PPE, and extensive cleaning of exam room while observing appropriate contact time as indicated for disinfecting solutions.   I'm seeing this patient by the request  of:  Kelton Pillar, MD  CC: Knee pain follow-up, shoulder pain follow-up  [id number]   01/03/2021 Injections given today on the left knee only.  Right knee feels like it is doing relatively well.  Will refer patient to a surgeon of her choice with different intervention call [facility]  We will see how patient does.  Patient knows we are here otherwise.  With her potentially having surgery in [place] then we will do physical therapy here likely with our Ortho care.  Update 02/21/2021 Dawn [name] is a 65 y.o. female coming in with complaint of B knee and L shoulder pain. Suppose to have [place] by Dr. Joya Gaskins.  It appears that patient is scheduled for October 2022.  Patient states knee continues to give her some discomfort.  Wanting to know what else can be done at this time until she has the surgery.  Pain in L shoulder continues over anterior aspect. Pain radiates down into the bicep and fingers will be tingling intermittently. Pain when she is returning arm from flexed position.  Patient describes it as a dull, throbbing aching pain.   Patient's left shoulder x-rays were independently visualized by me.  Left shoulder x-ray showed the patient did have mild glenohumeral and acromioclavicular arthritis.  Otherwise fairly unremarkable.   Past Medical History:  Diagnosis Date  . Abnormal glucose   . Allergic rhinitis   . Allergy   . Anxiety    no per pt  . Arthritis     bilateral knees- get cortisone injections  . Asthma    as a child  . Carpal tunnel syndrome    right  . Cervical dysplasia    1996  . Endometriosis   . Fibroids   . Hemorrhoids   . High cholesterol   . Hyperlipemia   . Hypertension   . Migraine   . Neck pain   . Stroke [facility]) 2014   TIA- negative work up in ED 2014  . Transient cerebral ischemia    10-22-12 ED, tia s/s but negative work up in ED. see note    Past Surgical History:  Procedure Laterality Date  . ABDOMINAL HYSTERECTOMY  Partial  . CARPAL TUNNEL RELEASE Right   . cold knife cone     for dysplasia  . COLONOSCOPY     last 11-2008 w/patterson, hems only   . KNEE ARTHROSCOPY WITH MEDIAL MENISECTOMY Right 03/28/2016   Procedure: RIGHT KNEE ARTHROSCOPY CHONDROPLASTY WITH MEDIAL MENISCECTOMY;  Surgeon: [name], MD;  Location: [facility];  Service: Orthopedics;  Laterality: Right;  . TRIGGER FINGER RELEASE     thumb, middle finger   . TUBAL LIGATION     Social History   Socioeconomic History  . Marital status: Married    Spouse name: Not on file  . Number of children: 1  . Years of education: Not on file  . Highest education level: Not on file  Occupational History  . Occupation: Retired  Tobacco Use  . Smoking  status: Never Smoker  . Smokeless tobacco: Never Used  Vaping Use  . Vaping Use: Never used  Substance and Sexual Activity  . Alcohol use: Yes    Alcohol/week: 1.0 standard drink    Types: 1 Glasses of wine per week    Comment: occasionally  . Drug use: No  . Sexual activity: Never  Other Topics Concern  . Not on file  Social History Narrative  . Not on file   Social Determinants of Health   Financial Resource Strain: Not on file  Food Insecurity: Not on file  Transportation Needs: Not on file  Physical Activity: Not on file  Stress: Not on file  Social Connections: Not on file   Allergies  Allergen Reactions  . Sulfa Antibiotics Rash   Family History  Problem  Relation Age of Onset  . Colon cancer Father   . Stomach cancer Father   . Diabetes Mother   . Hypertension Mother   . CVA Mother   . Kidney disease Mother   . Aneurysm Mother   . Colon cancer Maternal Uncle   . Breast cancer Maternal Aunt   . Kidney disease Maternal Aunt   . Heart disease Brother   . Throat cancer Maternal Uncle   . Rectal cancer Neg Hx   . Esophageal cancer Neg Hx      Current Outpatient Medications (Cardiovascular):  .  rosuvastatin (CRESTOR) 5 MG tablet, Take 5 mg by mouth daily. Marland Kitchen  triamterene-hydrochlorothiazide (MAXZIDE-25) 37.5-25 MG per tablet, Take 1 tablet by mouth daily.  Current Outpatient Medications (Respiratory):  .  fexofenadine (ALLEGRA) 180 MG tablet, Take 180 mg by mouth daily.  Current Outpatient Medications (Analgesics):  Marland Kitchen  Acetaminophen (TYLENOL PO), Take by mouth as needed. Marland Kitchen  aspirin 81 MG chewable tablet, Chew 81 mg by mouth daily. .  naproxen sodium (ANAPROX) 220 MG tablet, Take 220 mg by mouth 2 (two) times daily with a meal.   Current Outpatient Medications (Other):  .  cholecalciferol (VITAMIN D) 1000 UNITS tablet, Take 2,000 Units by mouth daily. .  cyclobenzaprine (FLEXERIL) 10 MG tablet, Take 10 mg by mouth 3 (three) times daily as needed. For headache .  Diclofenac Sodium 2 % SOLN, Place onto the skin. Marland Kitchen  gabapentin (NEURONTIN) 300 MG capsule, Take 1 capsule (300 mg total) by mouth 3 (three) times daily as needed. .  Multiple Vitamin (MULTIVITAMIN WITH MINERALS) TABS, Take 1 tablet by mouth daily. .  Na Sulfate-K Sulfate-Mg Sulf (SUPREP BOWEL PREP KIT) 17.5-3.13-1.6 GM/177ML SOLN, Suprep as directed, no substitutions .  OVER THE COUNTER MEDICATION, OTC laxative- unsure which kind- uses almost daily   Reviewed prior external information including notes and imaging from  primary care provider As well as notes that were available from care everywhere and other healthcare systems.  Past medical history, social, surgical and  family history all reviewed in electronic medical record.  No pertanent information unless stated regarding to the chief complaint.   Review of Systems:  No headache, visual changes, nausea, vomiting, diarrhea, constipation, dizziness, abdominal pain, skin rash, fevers, chills, night sweats, weight loss, swollen lymph nodes, body aches, joint swelling, chest pain, shortness of breath, mood changes. POSITIVE muscle aches  Objective  Blood pressure 128/86, pulse 90, height $RemoveBe'5\' 2"'[id number]$  (1.575 m), weight 190 lb (86.2 kg), SpO2 96 %.   General: No apparent distress alert and oriented x3 mood and affect normal, dressed appropriately.  HEENT: Pupils equal, extraocular movements intact  Respiratory: Patient's speak in full  sentences and does not appear short of breath  Cardiovascular: No lower extremity edema, non tender, no erythema  Gait antalgic still has arthritic changes of the knees. MSK: Left shoulder exam still shows the patient does have positive impingement noted. Patient does have mild positive crossover of the shoulder as well.  Rotator cuff strength 4+ out of 5 compared to the contralateral side.  Some limited internal and external range of motion.  Limited musculoskeletal ultrasound was performed and interpreted by Lyndal Pulley  Limited ultrasound of patient's left shoulder shows the patient may have some mild degenerative changes of the supraspinatus noted.  He does have some mild effusion noted of the acromioclavicular joint.  Patient does have what appears to be a reactive subacromial bursitis noted as well.  Procedure: Real-time Ultrasound Guided Injection of left glenohumeral joint Device: GE Logiq E  Ultrasound guided injection is preferred based studies that show increased duration, increased effect, greater accuracy, decreased procedural pain, increased response rate with ultrasound guided versus blind injection.  Verbal informed consent obtained.  Time-out conducted.  Noted no  overlying erythema, induration, or other signs of local infection.  Skin prepped in a sterile fashion.  Local anesthesia: Topical Ethyl chloride.  With sterile technique and under real time ultrasound guidance:  Joint visualized.  21g 2 inch needle inserted posterior approach. Pictures taken for needle placement. Patient did have injection of 2 cc of 0.5% Marcaine, and 1cc of Kenalog 40 mg/dL. Completed without difficulty  Pain immediately resolved suggesting accurate placement of the medication.  Advised to call if fevers/chills, erythema, induration, drainage, or persistent bleeding.  Impression: Technically successful ultrasound guided injection.   Impression and Recommendations:     The above documentation has been reviewed and is accurate and complete Lyndal Pulley, DO

## 2021-02-21 ENCOUNTER — Ambulatory Visit: Payer: Self-pay

## 2021-02-21 ENCOUNTER — Other Ambulatory Visit: Payer: Self-pay

## 2021-02-21 ENCOUNTER — Ambulatory Visit (INDEPENDENT_AMBULATORY_CARE_PROVIDER_SITE_OTHER): Payer: Medicare Other | Admitting: Family Medicine

## 2021-02-21 ENCOUNTER — Encounter: Payer: Self-pay | Admitting: Family Medicine

## 2021-02-21 VITALS — BP 128/86 | HR 90 | Ht 62.0 in | Wt 190.0 lb

## 2021-02-21 DIAGNOSIS — M25511 Pain in right shoulder: Secondary | ICD-10-CM | POA: Diagnosis not present

## 2021-02-21 DIAGNOSIS — M170 Bilateral primary osteoarthritis of knee: Secondary | ICD-10-CM | POA: Diagnosis not present

## 2021-02-21 DIAGNOSIS — M7542 Impingement syndrome of left shoulder: Secondary | ICD-10-CM | POA: Diagnosis not present

## 2021-02-21 DIAGNOSIS — G8929 Other chronic pain: Secondary | ICD-10-CM

## 2021-02-21 NOTE — Assessment & Plan Note (Signed)
Patient continues to have some signs that is consistent with more of a possibility of the impingement of the shoulder.  Patient now with less hypoechoic changes in the shoulder I am concerned that patient may have a possible rotator cuff tear.  Patient is to continue with conservative therapy including home exercises, icing regimen.  Worsening symptoms I do think we should get advanced imaging with an MRI.  Patient will follow up again in 6 weeks

## 2021-02-21 NOTE — Patient Instructions (Signed)
Injected shoulder joints today Ice at end of day for 20 minutes and after exercises See me again in 4-6 weeks

## 2021-02-21 NOTE — Assessment & Plan Note (Signed)
Patient is likely going to have another injection she wants for the left knee.  Patient will be having a replacement in October.  We will monitor at this point.

## 2021-03-27 NOTE — Progress Notes (Signed)
[facility] [address] [place] Phone: [phone] Subjective:   I [name] am serving as a Education administrator for Dr. Hulan Saas.  This visit occurred during the SARS-CoV-2 public health emergency.  Safety protocols were in place, including screening questions prior to the visit, additional usage of staff PPE, and extensive cleaning of exam room while observing appropriate contact time as indicated for disinfecting solutions.   I'm seeing this patient by the request  of:  Kelton Pillar, MD  CC: Bilateral shoulder and bilateral knee pain  [id number]  02/21/2021 Patient is likely going to have another injection she wants for the left knee.  Patient will be having a replacement in October.  We will monitor at this point  Patient continues to have some signs that is consistent with more of a possibility of the impingement of the shoulder.  Patient now with less hypoechoic changes in the shoulder I am concerned that patient may have a possible rotator cuff tear.  Patient is to continue with conservative therapy including home exercises, icing regimen.  Worsening symptoms I do think we should get advanced imaging with an MRI.  Patient will follow up again in 6 weeks  Update 03/28/2021 LANNAH KOIKE is a 65 y.o. female coming in with complaint of B shoulder and B knee pain.Patient states the shoulders are better. Right knee is painful at night. During the day the right is better than the left.   Patient is scheduled to have a knee replacement in October.  Patient is feeling relatively good overall.  Still has instability noted in the knees. Shoulder pain doing relatively well.  Still hurts her with certain fast movements or reaching overhead.  Did have an injection at last exam.     Past Medical History:  Diagnosis Date   Abnormal glucose    Allergic rhinitis    Allergy    Anxiety    no per pt   Arthritis    bilateral knees- get  cortisone injections   Asthma    as a child   Carpal tunnel syndrome    right   Cervical dysplasia    1996   Endometriosis    Fibroids    Hemorrhoids    High cholesterol    Hyperlipemia    Hypertension    Migraine    Neck pain    Stroke ([facility]) 2014   TIA- negative work up in ED 2014   Transient cerebral ischemia    10-22-12 ED, tia s/s but negative work up in ED. see note    Past Surgical History:  Procedure Laterality Date   ABDOMINAL HYSTERECTOMY  Partial   CARPAL TUNNEL RELEASE Right    cold knife cone     for dysplasia   COLONOSCOPY     last 11-2008 w/patterson, hems only    KNEE ARTHROSCOPY WITH MEDIAL MENISECTOMY Right 03/28/2016   Procedure: RIGHT KNEE ARTHROSCOPY CHONDROPLASTY WITH MEDIAL MENISCECTOMY;  Surgeon: [name], MD;  Location: [facility];  Service: Orthopedics;  Laterality: Right;   TRIGGER FINGER RELEASE     thumb, middle finger    TUBAL LIGATION     Social History   Socioeconomic History   Marital status: Married    Spouse name: Not on file   Number of children: 1   Years of education: Not on file   Highest education level: Not on file  Occupational History   Occupation: Retired  Tobacco Use  Smoking status: Never   Smokeless tobacco: Never  Vaping Use   Vaping Use: Never used  Substance and Sexual Activity   Alcohol use: Yes    Alcohol/week: 1.0 standard drink    Types: 1 Glasses of wine per week    Comment: occasionally   Drug use: No   Sexual activity: Never  Other Topics Concern   Not on file  Social History Narrative   Not on file   Social Determinants of Health   Financial Resource Strain: Not on file  Food Insecurity: Not on file  Transportation Needs: Not on file  Physical Activity: Not on file  Stress: Not on file  Social Connections: Not on file   Allergies  Allergen Reactions   Sulfa Antibiotics Rash   Family History  Problem Relation Age of Onset   Colon cancer Father    Stomach cancer  Father    Diabetes Mother    Hypertension Mother    CVA Mother    Kidney disease Mother    Aneurysm Mother    Colon cancer Maternal Uncle    Breast cancer Maternal Aunt    Kidney disease Maternal Aunt    Heart disease Brother    Throat cancer Maternal Uncle    Rectal cancer Neg Hx    Esophageal cancer Neg Hx      Current Outpatient Medications (Cardiovascular):    rosuvastatin (CRESTOR) 5 MG tablet, Take 5 mg by mouth daily.   triamterene-hydrochlorothiazide (MAXZIDE-25) 37.5-25 MG per tablet, Take 1 tablet by mouth daily.  Current Outpatient Medications (Respiratory):    fexofenadine (ALLEGRA) 180 MG tablet, Take 180 mg by mouth daily.  Current Outpatient Medications (Analgesics):    Acetaminophen (TYLENOL PO), Take by mouth as needed.   aspirin 81 MG chewable tablet, Chew 81 mg by mouth daily.   naproxen sodium (ANAPROX) 220 MG tablet, Take 220 mg by mouth 2 (two) times daily with a meal.   Current Outpatient Medications (Other):    cholecalciferol (VITAMIN D) 1000 UNITS tablet, Take 2,000 Units by mouth daily.   cyclobenzaprine (FLEXERIL) 10 MG tablet, Take 10 mg by mouth 3 (three) times daily as needed. For headache   Diclofenac Sodium 2 % SOLN, Place onto the skin.   gabapentin (NEURONTIN) 300 MG capsule, Take 1 capsule (300 mg total) by mouth 3 (three) times daily as needed.   Multiple Vitamin (MULTIVITAMIN WITH MINERALS) TABS, Take 1 tablet by mouth daily.   Na Sulfate-K Sulfate-Mg Sulf (SUPREP BOWEL PREP KIT) 17.5-3.13-1.6 GM/177ML SOLN, Suprep as directed, no substitutions   OVER THE COUNTER MEDICATION, OTC laxative- unsure which kind- uses almost daily     Objective  Blood pressure 140/80, pulse 80, height $RemoveBe'5\' 2"'[id number]$  (1.575 m), weight 191 lb (86.6 kg), SpO2 97 %.   General: No apparent distress alert and oriented x3 mood and affect normal, dressed appropriately.  HEENT: Pupils equal, extraocular movements intact  Respiratory: Patient's speak in full sentences and  does not appear short of breath  Cardiovascular: No lower extremity edema, non tender, no erythema  Gait mild antalgic Left shoulder still has positive impingement noted.  Rotator cuff strength seems 4+ out of 5 compared to the contralateral side.  Mild limited internal range of motion but otherwise fairly unremarkable. Bilateral knees still have the arthritic changes with instability with valgus and varus force with significant crepitus noted of the knees.   Impression and Recommendations:     The above documentation has been reviewed and is accurate and complete  Lyndal Pulley, DO

## 2021-03-28 ENCOUNTER — Ambulatory Visit (INDEPENDENT_AMBULATORY_CARE_PROVIDER_SITE_OTHER): Payer: Medicare Other | Admitting: Family Medicine

## 2021-03-28 ENCOUNTER — Encounter: Payer: Self-pay | Admitting: Family Medicine

## 2021-03-28 ENCOUNTER — Other Ambulatory Visit: Payer: Self-pay

## 2021-03-28 DIAGNOSIS — M170 Bilateral primary osteoarthritis of knee: Secondary | ICD-10-CM

## 2021-03-28 DIAGNOSIS — M7542 Impingement syndrome of left shoulder: Secondary | ICD-10-CM

## 2021-03-28 NOTE — Assessment & Plan Note (Signed)
Patient shoulder is much improved.  Discussed with patient about icing regimen and home exercises, increase activity slowly.  Patient will continue to monitor.  Follow-up with me again in 2 months.  May need injection because patient will be having a knee replacement in October.

## 2021-03-28 NOTE — Assessment & Plan Note (Signed)
Patient at the moment seems to be doing relatively well.  We discussed may be a possible injection but with patient doing relatively well we will hold.  Patient will be having a replacement in October and we discussed the possibility ago that we should not do any other injections leading up to it.  She can check with the surgeon if necessary.  Follow-up with me again 2 months for the shoulder more than the knees.

## 2021-03-28 NOTE — Patient Instructions (Signed)
Good to see you The shoulder is getting better If knees get worse we can do prednisone pills short time See me again in 2 months just in case

## 2021-06-06 ENCOUNTER — Ambulatory Visit: Payer: Medicare Other | Admitting: Family Medicine

## 2021-06-11 ENCOUNTER — Ambulatory Visit
Admission: RE | Admit: 2021-06-11 | Discharge: 2021-06-11 | Disposition: A | Discharge status: Home / Self Care | Payer: Medicare Other | Source: Ambulatory Visit | Attending: Family Medicine | Admitting: Family Medicine

## 2021-06-11 ENCOUNTER — Other Ambulatory Visit: Payer: Self-pay | Admitting: Family Medicine

## 2021-06-11 DIAGNOSIS — Z01818 Encounter for other preprocedural examination: Secondary | ICD-10-CM

## 2021-06-13 ENCOUNTER — Encounter

## 2021-07-04 ENCOUNTER — Encounter

## 2021-07-17 ENCOUNTER — Encounter: Admit: 2021-07-17 | Discharge: 2021-07-17 | Payer: MEDICARE | Attending: Orthopaedic Surgery | Primary: Family Medicine

## 2021-07-17 ENCOUNTER — Inpatient Hospital Stay: Admit: 2021-07-17 | Payer: MEDICARE | Primary: Family Medicine

## 2021-07-17 ENCOUNTER — Ambulatory Visit: Attending: Orthopaedic Surgery

## 2021-07-17 DIAGNOSIS — M25562 Pain in left knee: Secondary | ICD-10-CM

## 2021-07-17 MED ORDER — CEPHALEXIN 500 MG CAP
500 mg | ORAL_CAPSULE | Freq: Three times a day (TID) | ORAL | 0 refills | Status: AC
Start: 2021-07-17 — End: 2021-07-20

## 2021-07-17 MED ORDER — OXYCODONE-ACETAMINOPHEN 5 MG-325 MG TAB
5-325 mg | ORAL_TABLET | ORAL | 0 refills | Status: AC | PRN
Start: 2021-07-17 — End: 2021-07-31

## 2021-07-17 MED ORDER — ONDANSETRON 4 MG TAB, RAPID DISSOLVE
4 mg | ORAL_TABLET | Freq: Three times a day (TID) | ORAL | 0 refills | Status: AC | PRN
Start: 2021-07-17 — End: ?

## 2021-07-17 MED ORDER — OXYCODONE-ACETAMINOPHEN 5 MG-325 MG TAB
5-325 mg | ORAL_TABLET | ORAL | 0 refills | Status: DC | PRN
Start: 2021-07-17 — End: 2021-07-17

## 2021-07-17 NOTE — Progress Notes (Signed)
Name: Sharon Sanders    DOB: [birth year]     Service Dept: [facility]    Chief Complaint   Patient presents with    Pre-op Exam    Knee Pain        There were no vitals taken for this visit.     Allergies   Allergen Reactions    Sulfa (Sulfonamide Antibiotics) Rash        Current Outpatient Medications   Medication Sig Dispense Refill    ergocalciferol, vitamin D2, (VITAMIN D2 PO) Vitamin D   4000 IU daily      aspirin 81 mg chewable tablet Take 81 mg by mouth daily.      betamethasone valerate (VALISONE) 0.1 % topical cream betamethasone valerate 0.1 % topical cream   APPLY THIN LAYER TOPICALLY TO THE AFFECTED AREA EVERY DAY      cholecalciferol (VITAMIN D3) (1000 Units /25 mcg) tablet Take 2,000 Units by mouth daily.      diclofenac sodium 20 mg/gram /actuation(2 %) sopm by TransDERmal route.      fluticasone propionate (FLONASE) 50 mcg/actuation nasal spray fluticasone propionate 50 mcg/actuation nasal spray,suspension   SHAKE LIQUID AND USE 1 SPRAY IN EACH NOSTRIL EVERY DAY      naproxen sodium (NAPROSYN) 220 mg tablet Take 220 mg by mouth two (2) times daily (with meals).      rosuvastatin (CRESTOR) 5 mg tablet rosuvastatin 5 mg tablet   TAKE 1 TABLET BY MOUTH EVERY DAY      triamterene-hydroCHLOROthiazide (MAXZIDE) 37.5-25 mg per tablet triamterene 37.5 mg-hydrochlorothiazide 25 mg tablet   TAKE 1 TABLET BY MOUTH EVERY DAY IN THE MORNING        There is no problem list on file for this patient.     Family History   Problem Relation Age of Onset    No Known Problems Mother     No Known Problems Father       Social History     Socioeconomic History    Marital status: MARRIED   Tobacco Use    Smoking status: Never    Smokeless tobacco: Never   Vaping Use    Vaping Use: Never used   Substance and Sexual Activity    Alcohol use: Yes     Comment: occ    Drug use: Never    Sexual activity: Not Currently      History reviewed. No pertinent surgical history.   Past Medical  History:   Diagnosis Date    High cholesterol     Hypertension         I have reviewed and agree with PFSH and ROS and intake form in chart and the record furthermore I have reviewed prior medical record(s) regarding this patients care during this appointment.     Review of Systems:   Patient is a pleasant appearing individual, appropriately dressed, well hydrated, well nourished, who is alert, appropriately oriented for age, and in no acute distress with a normal gait and normal affect who does not appear to be in any significant pain.    Physical Exam:  Left Knee -Decrease range of motion with flexion, Knee arc of greater than 50 degrees, Some crepitation, Grossly neurovascularly intact, Good cap refill, No skin lesion, Moderate swelling, some gross instability, Some quadriceps weakness, Kellgren and Lawrence at least grade 3    Right Knee - Full Range of Motion, No crepitation, Grossly neurovascularly intact, Good cap refill, No  skin lesion, No swelling, No gross instability, No quadriceps weakness     Inpatient status: The patient has admitted to severe pain in the affected knee and due to such pain they are unable to complete activities of daily living at home and/or work on a regular basis where conservative treatments have failed. After extensive discussion with the patient, they have chosen to receive a total knee replacement with the expectation of inpatient procedure. Their dependent functional status (i.e. lack of capable support and safety at home, pain management, comorbities, or difficulty ambulating with assistive walking devices) would deem them a candidate for an inpatient stay. The patient acknowledges and understand the plan.    The risks of surgery were explained to the patient which include but not limited to infection, nerve injury, artery injury, tendon injury, poor result, poor wound healing, unforeseen incidence, bleeding, infection, nerve damage, failure to improve, worsening of symptoms,  morbidity, and mortality risks were explained. All questions were answered. Patient was told of no guarantees. Patient accepts all risks and benefits. A consent for surgery will be documented and signed by the patient or a legal guardian. All questions were answered. The procedure was explained in detail.     The patient was counseled about the risks of contracting Covid-19 during their perioperative period and any recovery window from their procedure. The patient was made aware that contracting Covid-19 may worsen their prognosis for recovering from their procedure and lend to a higher morbidity and/or mortality risk. All material risks, benefits, and reasonable alternatives including postponing the procedure were discussed. The patient DOES wish to proceed with their procedure at this time.    Encounter Diagnoses     ICD-10-CM ICD-9-CM   1. Left knee pain, unspecified chronicity  M25.562 719.46   2. Osteoarthritis of left knee, unspecified osteoarthritis type  M17.12 715.96       HPI:  The patient is here with a chief complaint of left knee pain, status post diagnosis of osteoarthritis, continue to have difficulty with it.       Assessment/Plan:  Plan at this point will be for left total knee replacement, already scheduled.  Pain is 4/10.  Plan for surgery and go from there.  If the patient gets worse, she is to give me a call.        As part of continued conservative pain management options the patient was advised to utilize Tylenol or OTC NSAIDS as long as it is not medically contraindicated.     Return to Office:   Follow-up and Dispositions    Return for already scheduled for surgery.           Scribed by Diona Foley, LPN as dictated by [facility] A. Allena Katz, MD.  Documentation True and Accepted Gabriell Casimir A. Allena Katz, MD

## 2021-07-18 ENCOUNTER — Ambulatory Visit: Admit: 2021-07-18 | Payer: MEDICARE | Primary: Family Medicine

## 2021-07-18 ENCOUNTER — Encounter: Admit: 2021-07-18 | Discharge: 2021-07-18 | Payer: MEDICARE | Attending: Orthopaedic Surgery | Primary: Family Medicine

## 2021-07-18 ENCOUNTER — Inpatient Hospital Stay: Payer: MEDICARE

## 2021-07-18 MED ORDER — FLUMAZENIL 0.1 MG/ML IV SOLN
0.1 mg/mL | INTRAVENOUS | Status: DC | PRN
Start: 2021-07-18 — End: 2021-07-18

## 2021-07-18 MED ORDER — ASPIRIN 325 MG TAB, DELAYED RELEASE
325 mg | Freq: Two times a day (BID) | ORAL | Status: DC
Start: 2021-07-18 — End: 2021-07-19
  Administered 2021-07-19: 13:00:00 via ORAL

## 2021-07-18 MED ORDER — SODIUM CHLORIDE 0.9 % IV
1000 mg/10 mL (100 mg/mL) | INTRAVENOUS | Status: DC | PRN
Start: 2021-07-18 — End: 2021-07-18
  Administered 2021-07-18: 14:00:00 via INTRAVENOUS

## 2021-07-18 MED ORDER — CEFAZOLIN 2 GRAM SOLUTION FOR INJECTION
2 gram | Freq: Three times a day (TID) | INTRAMUSCULAR | Status: AC
Start: 2021-07-18 — End: 2021-07-19
  Administered 2021-07-18 – 2021-07-19 (×2): via INTRAVENOUS

## 2021-07-18 MED ORDER — DIPHENHYDRAMINE HCL 50 MG/ML IJ SOLN
50 mg/mL | INTRAMUSCULAR | Status: DC | PRN
Start: 2021-07-18 — End: 2021-07-18

## 2021-07-18 MED ORDER — MIDAZOLAM 1 MG/ML IJ SOLN
1 mg/mL | INTRAMUSCULAR | Status: AC
Start: 2021-07-18 — End: ?

## 2021-07-18 MED ORDER — DEXAMETHASONE SODIUM PHOSPHATE 4 MG/ML IJ SOLN
4 mg/mL | INTRAMUSCULAR | Status: AC
Start: 2021-07-18 — End: 2021-07-18
  Administered 2021-07-18: 14:00:00 via PERINEURAL

## 2021-07-18 MED ORDER — BUPIVACAINE (PF) 0.5 % (5 MG/ML) IJ SOLN
0.5 % (5 mg/mL) | INTRAMUSCULAR | Status: AC
Start: 2021-07-18 — End: ?

## 2021-07-18 MED ORDER — KETOROLAC TROMETHAMINE 30 MG/ML INJECTION
301 mg/mL (1 mL) | Freq: Four times a day (QID) | INTRAMUSCULAR | Status: AC | PRN
Start: 2021-07-18 — End: 2021-07-19

## 2021-07-18 MED ORDER — DIPHENHYDRAMINE HCL 50 MG/ML IJ SOLN
50 mg/mL | INTRAMUSCULAR | Status: AC | PRN
Start: 2021-07-18 — End: 2021-07-19

## 2021-07-18 MED ORDER — ESMOLOL 10 MG/ML IV SOLN
100 mg/10 mL (10 mg/mL) | INTRAVENOUS | Status: DC | PRN
Start: 2021-07-18 — End: 2021-07-18
  Administered 2021-07-18 (×2): via INTRAVENOUS

## 2021-07-18 MED ORDER — VANCOMYCIN 1.5 GRAM INTRAVENOUS SOLUTION
1.5 gram | Freq: Once | INTRAVENOUS | Status: AC
Start: 2021-07-18 — End: 2021-07-18
  Administered 2021-07-18: 12:00:00 via INTRAVENOUS

## 2021-07-18 MED ORDER — SODIUM CHLORIDE 0.9 % IJ SYRG
Freq: Three times a day (TID) | INTRAMUSCULAR | Status: DC
Start: 2021-07-18 — End: 2021-07-19
  Administered 2021-07-18 – 2021-07-19 (×3): via INTRAVENOUS

## 2021-07-18 MED ORDER — SODIUM CHLORIDE 0.9 % IJ SYRG
INTRAMUSCULAR | Status: AC | PRN
Start: 2021-07-18 — End: 2021-07-19

## 2021-07-18 MED ORDER — SODIUM CHLORIDE 0.9 % IJ SYRG
INTRAMUSCULAR | Status: DC | PRN
Start: 2021-07-18 — End: 2021-07-18

## 2021-07-18 MED ORDER — POLYMYXIN B SULFATE 500,000 UNIT IJ SOLR
500000 unit | INTRAMUSCULAR | Status: AC
Start: 2021-07-18 — End: ?

## 2021-07-18 MED ORDER — POLYMYXIN B SULFATE 500,000 UNIT IJ SOLR
500000 unit | INTRAMUSCULAR | Status: DC | PRN
Start: 2021-07-18 — End: 2021-07-18
  Administered 2021-07-18: 15:00:00

## 2021-07-18 MED ORDER — OXYCODONE-ACETAMINOPHEN 10 MG-325 MG TAB
10-325 mg | ORAL | Status: AC | PRN
Start: 2021-07-18 — End: 2021-07-19
  Administered 2021-07-18: 21:00:00 via ORAL

## 2021-07-18 MED ORDER — ONDANSETRON (PF) 4 MG/2 ML INJECTION
42 mg/2 mL | INTRAMUSCULAR | Status: DC | PRN
Start: 2021-07-18 — End: 2021-07-19

## 2021-07-18 MED ORDER — SENNOSIDES 8.6 MG TAB
8.6 mg | Freq: Two times a day (BID) | ORAL | Status: DC
Start: 2021-07-18 — End: 2021-07-19
  Administered 2021-07-18 – 2021-07-19 (×2): via ORAL

## 2021-07-18 MED ORDER — SODIUM CHLORIDE 0.9 % IJ SYRG
Freq: Three times a day (TID) | INTRAMUSCULAR | Status: DC
Start: 2021-07-18 — End: 2021-07-18

## 2021-07-18 MED ORDER — SENNOSIDES 8.6 MG TAB
8.6 mg | Freq: Two times a day (BID) | ORAL | Status: DC
Start: 2021-07-18 — End: 2021-07-18

## 2021-07-18 MED ORDER — BUPIVACAINE (PF) 0.5 % (5 MG/ML) IJ SOLN
0.5 % (5 mg/mL) | INTRAMUSCULAR | Status: AC
Start: 2021-07-18 — End: 2021-07-18
  Administered 2021-07-18: 14:00:00 via PERINEURAL

## 2021-07-18 MED ORDER — ACETAMINOPHEN 500 MG TAB
500 mg | Freq: Once | ORAL | Status: AC
Start: 2021-07-18 — End: 2021-07-18
  Administered 2021-07-18: 12:00:00 via ORAL

## 2021-07-18 MED ORDER — GABAPENTIN 300 MG CAP
300 mg | Freq: Once | ORAL | Status: AC
Start: 2021-07-18 — End: 2021-07-18
  Administered 2021-07-18: 12:00:00 via ORAL

## 2021-07-18 MED ORDER — BUPIVACAINE (PF) 0.25 % (2.5 MG/ML) IJ SOLN
0.25 % (2.5 mg/mL) | INTRAMUSCULAR | Status: AC
Start: 2021-07-18 — End: ?

## 2021-07-18 MED ORDER — BUPIVACAINE (PF) 0.25 % (2.5 MG/ML) IJ SOLN
0.25 % (2.5 mg/mL) | INTRAMUSCULAR | Status: DC | PRN
Start: 2021-07-18 — End: 2021-07-18
  Administered 2021-07-18: 15:00:00 via INTRA_ARTICULAR

## 2021-07-18 MED ORDER — TRANEXAMIC ACID 1,000 MG/10 ML (100 MG/ML) IV
1000 mg/10 mL (100 mg/mL) | INTRAVENOUS | Status: AC
Start: 2021-07-18 — End: ?

## 2021-07-18 MED ORDER — FENTANYL CITRATE (PF) 50 MCG/ML IJ SOLN
50 mcg/mL | INTRAMUSCULAR | Status: DC | PRN
Start: 2021-07-18 — End: 2021-07-18

## 2021-07-18 MED ORDER — BISACODYL 10 MG RECTAL SUPPOSITORY
10 mg | Freq: Every day | RECTAL | Status: AC | PRN
Start: 2021-07-18 — End: 2021-07-19

## 2021-07-18 MED ORDER — ESMOLOL 10 MG/ML IV SOLN
100 mg/10 mL (10 mg/mL) | INTRAVENOUS | Status: AC
Start: 2021-07-18 — End: ?

## 2021-07-18 MED ORDER — DEXAMETHASONE SODIUM PHOSPHATE (PF) 10 MG/ML INJECTION
10 mg/mL | INTRAMUSCULAR | Status: AC
Start: 2021-07-18 — End: ?

## 2021-07-18 MED ORDER — PROPOFOL 10 MG/ML IV EMUL
10 mg/mL | INTRAVENOUS | Status: AC
Start: 2021-07-18 — End: ?

## 2021-07-18 MED ORDER — LACTATED RINGERS IV
INTRAVENOUS | Status: DC
Start: 2021-07-18 — End: 2021-07-18

## 2021-07-18 MED ORDER — ALBUTEROL SULFATE 0.083 % (0.83 MG/ML) SOLN FOR INHALATION
2.5 mg /3 mL (0.083 %) | Freq: Once | RESPIRATORY_TRACT | Status: DC | PRN
Start: 2021-07-18 — End: 2021-07-18

## 2021-07-18 MED ORDER — CEFAZOLIN 2 GRAM SOLUTION FOR INJECTION
2 gram | Freq: Once | INTRAMUSCULAR | Status: DC
Start: 2021-07-18 — End: 2021-07-18

## 2021-07-18 MED ORDER — ONDANSETRON (PF) 4 MG/2 ML INJECTION
4 mg/2 mL | INTRAMUSCULAR | Status: DC | PRN
Start: 2021-07-18 — End: 2021-07-18
  Administered 2021-07-18: 14:00:00 via INTRAVENOUS

## 2021-07-18 MED ORDER — ONDANSETRON (PF) 4 MG/2 ML INJECTION
4 mg/2 mL | Freq: Once | INTRAMUSCULAR | Status: DC
Start: 2021-07-18 — End: 2021-07-18

## 2021-07-18 MED ORDER — LACTATED RINGERS IV
INTRAVENOUS | Status: DC
Start: 2021-07-18 — End: 2021-07-18
  Administered 2021-07-18: 12:00:00 via INTRAVENOUS

## 2021-07-18 MED ORDER — PROPOFOL INFUSION
10 mg/mL | INTRAVENOUS | Status: DC | PRN
Start: 2021-07-18 — End: 2021-07-18
  Administered 2021-07-18 (×2): via INTRAVENOUS

## 2021-07-18 MED ORDER — BUPIVACAINE (PF) 0.75 % (7.5 MG/ML) IJ SOLN
0.75 % (7.5 mg/mL) | INTRAMUSCULAR | Status: AC
Start: 2021-07-18 — End: 2021-07-18
  Administered 2021-07-18: 14:00:00 via INTRATHECAL

## 2021-07-18 MED ORDER — OXYCODONE-ACETAMINOPHEN 5 MG-325 MG TAB
5-325 mg | ORAL | Status: AC | PRN
Start: 2021-07-18 — End: 2021-07-19
  Administered 2021-07-19 (×3): via ORAL

## 2021-07-18 MED ORDER — MIDAZOLAM 1 MG/ML IJ SOLN
1 mg/mL | INTRAMUSCULAR | Status: AC
Start: 2021-07-18 — End: 2021-07-18
  Administered 2021-07-18: 14:00:00 via INTRAVENOUS

## 2021-07-18 MED ORDER — NALOXONE 0.4 MG/ML INJECTION
0.4 mg/mL | INTRAMUSCULAR | Status: DC | PRN
Start: 2021-07-18 — End: 2021-07-18

## 2021-07-18 MED ORDER — ACETAMINOPHEN 325 MG TABLET
325 mg | ORAL | Status: DC | PRN
Start: 2021-07-18 — End: 2021-07-19

## 2021-07-18 MED ORDER — NALOXONE 0.4 MG/ML INJECTION
0.4 mg/mL | INTRAMUSCULAR | Status: AC | PRN
Start: 2021-07-18 — End: 2021-07-19

## 2021-07-18 MED FILL — SODIUM CHLORIDE 0.9 % IJ SYRG: INTRAMUSCULAR | Qty: 40

## 2021-07-18 MED FILL — BUPIVACAINE (PF) 0.5 % (5 MG/ML) IJ SOLN: 0.5 % (5 mg/mL) | INTRAMUSCULAR | Qty: 30

## 2021-07-18 MED FILL — OXYCODONE-ACETAMINOPHEN 10 MG-325 MG TAB: 10-325 mg | ORAL | Qty: 1

## 2021-07-18 MED FILL — PROPOFOL 10 MG/ML IV EMUL: 10 mg/mL | INTRAVENOUS | Qty: 20

## 2021-07-18 MED FILL — BUPIVACAINE (PF) 0.25 % (2.5 MG/ML) IJ SOLN: 0.25 % (2.5 mg/mL) | INTRAMUSCULAR | Qty: 30

## 2021-07-18 MED FILL — TRANEXAMIC ACID 1,000 MG/10 ML (100 MG/ML) IV: 1000 mg/10 mL (100 mg/mL) | INTRAVENOUS | Qty: 10

## 2021-07-18 MED FILL — GABAPENTIN 300 MG CAP: 300 mg | ORAL | Qty: 1

## 2021-07-18 MED FILL — ESMOLOL 10 MG/ML IV SOLN: 100 mg/10 mL (10 mg/mL) | INTRAVENOUS | Qty: 10

## 2021-07-18 MED FILL — DEXAMETHASONE SODIUM PHOSPHATE (PF) 10 MG/ML INJECTION: 10 mg/mL | INTRAMUSCULAR | Qty: 1

## 2021-07-18 MED FILL — VANCOMYCIN 1.5 GRAM INTRAVENOUS SOLUTION: 1.5 gram | INTRAVENOUS | Qty: 1500

## 2021-07-18 MED FILL — LACTATED RINGERS IV: INTRAVENOUS | Qty: 1000

## 2021-07-18 MED FILL — CEFAZOLIN 2 GRAM SOLUTION FOR INJECTION: 2 gram | INTRAMUSCULAR | Qty: 2000

## 2021-07-18 MED FILL — MIDAZOLAM 1 MG/ML IJ SOLN: 1 mg/mL | INTRAMUSCULAR | Qty: 2

## 2021-07-18 MED FILL — POLYMYXIN B SULFATE 500,000 UNIT IJ SOLR: 500000 unit | INTRAMUSCULAR | Qty: 500000

## 2021-07-18 MED FILL — ACETAMINOPHEN 500 MG TAB: 500 mg | ORAL | Qty: 2

## 2021-07-18 MED FILL — GERI-KOT 8.6 MG TABLET: 8.6 mg | ORAL | Qty: 1

## 2021-07-18 NOTE — Interval H&P Note (Signed)
Time ou completed for block at this time with Runell Gess, CRNA

## 2021-07-18 NOTE — Progress Notes (Signed)
1300- Received care of pt from PACU. Pt resting quietly.     1415- Tolerated clear well.

## 2021-07-18 NOTE — Interval H&P Note (Signed)
 TRANSFER - OUT REPORT:    Verbal report given to C. Gwenn, LPN on Sharon Sanders  being transferred to 258 for routine post - op       Report consisted of patient's Situation, Background, Assessment and   Recommendations(SBAR).     Information from the following report(s) SBAR, Procedure Summary, Intake/Output, and MAR was reviewed with the receiving nurse.    Lines:   Peripheral IV 07/18/21 Posterior;Right Hand (Active)   Site Assessment Clean, dry, & intact 07/18/21 1155   Phlebitis Assessment 0 07/18/21 1155   Infiltration Assessment 0 07/18/21 1155   Dressing Status Clean, dry, & intact 07/18/21 1155   Dressing Type Transparent 07/18/21 1155   Hub Color/Line Status Pink;Patent 07/18/21 1155   Alcohol Cap Used No 07/18/21 0802        Opportunity for questions and clarification was provided.      Patient transported with:   Registered Nurse, H. Winslow, RN  Visit Vitals  BP (!) 142/75   Pulse 85   Temp 97.5 F (36.4 C)   Resp 18   Ht 5' 2 (1.575 m)   Wt 87.2 kg (192 lb 4.8 oz)   SpO2 96%   BMI 35.17 kg/m

## 2021-07-18 NOTE — Progress Notes (Signed)
Problem: Mobility Impaired (Adult and Pediatric)  Goal: *Acute Goals and Plan of Care (Insert Text)  Description: Physical Therapy Goals  Initiated 07/18/2021 and to be accomplished within 1 day(s)  1.  Patient will move from supine to sit and sit to supine , scoot up and down, and roll side to side in bed with modified independence.    2.  Patient will transfer from bed to chair and chair to bed with modified independence using the least restrictive device.  3.  Patient will perform sit to stand with modified independence.  4.  Patient will ambulate with modified independence for 200 feet with the least restrictive device.   5.  Patient will ascend/descend 4 stairs with 2 handrail(s) with modified independence.    PLOF: [employer], cane, (I) ADLs.     Outcome: Progressing Towards Goal   PHYSICAL THERAPY EVALUATION     Patient: Sharon Sanders (65 y.o. 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 She is able to ambulate with a RW with min assist due to numbness/tingling in her feet. She requires min assist to stand due to the numbness as well. She also requires assistance to maintain balance at times. She is educated on a HEP and tolerates well. She would benefit from further P.T. to improve strength, gait, and stair navigation. She can return home with outpatient P.T.        PLAN :  Recommendations and Planned Interventions:   Patient to be seen 1-2x/day, 4-7 days/wk.  Discharge Recommendations: Outpatient  AM-PAC: 19/24  Further Equipment  Recommendations for Discharge: RW     SUBJECTIVE:   Patient states "I feel like I'm going to fall over."    OBJECTIVE DATA SUMMARY:     Past Medical History:   Diagnosis Date    High cholesterol     Hypertension    History reviewed. No pertinent surgical history.  Barriers to Learning/Limitations: None  Compensate with: N/A  Home Situation:   Home Situation  Home Environment: Private residence  # Steps to Enter: 1 (also has 2x1 steps in the house)  Rails to [employer]: No  One/Two Story Residence: One story  Living Alone: No  Support Systems: Spouse/Significant Other  Patient Expects to be Discharged to:: Home with outpatient services  Current DME Used/Available at Home: Cane, straight  Critical Behavior:  Neurologic State: Alert  Orientation Level: Oriented X4  Skin Integrity: Incision (comment) (left knee)  Skin Integumentary  Skin Integrity: Incision (comment) (left knee)     Strength:    Strength: Generally decreased, functional (L knee 4/5, SLR 1445, 4.5 hours after spinal)  Tone & Sensation:   Tone: Normal  Sensation: Impaired (feet tingling, pelvic area numb)  Coordination:  Coordination: Generally decreased, functional (LEs impaired by spinal but improving)  Range Of Motion:   AROM: Generally decreased, functional (L knee 9-70)  PROM: Generally decreased, functional (L knee 5-80)  Posture:  Posture (WDL): Within defined limits  Functional Mobility:  Bed Mobility:  Rolling: Modified independent  Supine to Sit: Modified independent  Sit to Supine: Modified independent  Scooting: Modified independent  Transfers:  Sit to Stand: Minimum assistance  Stand to Sit: Contact guard assistance  Balance:   Sitting: Intact  Standing: Impaired  Standing - Static: Fair  Standing - Dynamic : Fair  Ambulation/Gait Training:  Distance (ft): 40 Feet (ft)  Assistive Device: Walker, rolling  Ambulation - Level of Assistance: Minimal assistance     Gait Description (WDL): Exceptions to WDL  Gait Abnormalities: Path  deviations;Scissoring     Left Side Weight Bearing: As tolerated   AM-PAC:  19/24; Current research shows that an AM-PAC score of 17 or less is typically not associated with a discharge to the patient's home setting, whereas a score of 18 or greater is typically associated with a discharge to the patient's home setting.  Today's TX:   Pt is able to ambulate with a Rw with min assist. She is educated on a HEP and states understanding.   Pain:  Pain level pre-treatment: 0/10   Pain level post-treatment: 3/10  Pain Location: L knee  Pain Intervention(s): Medication (see MAR); Rest, Ice, Repositioning   Response to intervention: Nurse notified, See doc flow    Activity Tolerance:   Good  Please refer to the flowsheet for vital signs taken during this treatment.  After treatment:   []          Patient left in no apparent distress sitting up in chair  [x]          Patient left in no apparent distress in bed  [x]          Call bell left within reach  [x]          Nursing notified  [x]          Husband present  []          Bed alarm activated  []          SCDs applied    COMMUNICATION/EDUCATION:   [x]          Role of Physical Therapy in the acute care setting.  [x]          Fall prevention education was provided and the patient/caregiver indicated understanding.  [x]          Patient/family have participated as able in goal setting and plan of care.  [x]          Patient/family agree to work toward stated goals and plan of care.  []          Patient understands intent and goals of therapy, but is neutral about his/her participation.  []          Patient is unable to participate in goal setting/plan of care: ongoing with therapy staff.  []          Other:    Thank you for this referral.  Dimas Chyle, PT, DPT   Time Calculation: 25 mins

## 2021-07-18 NOTE — Anesthesia Post-Procedure Evaluation (Signed)
Procedure(s):  LEFT TKA (INPT).    regional, general - backup    Anesthesia Post Evaluation      Multimodal analgesia: multimodal analgesia used between 6 hours prior to anesthesia start to PACU discharge  Patient location during evaluation: bedside  Patient participation: complete - patient cannot participate  Level of consciousness: awake and alert  Pain management: adequate  Airway patency: patent  Anesthetic complications: no  Cardiovascular status: stable  Respiratory status: acceptable  Hydration status: acceptable  Comments: DC when criteria met.  Post anesthesia nausea and vomiting:  none  Final Post Anesthesia Temperature Assessment:  Normothermia (36.0-37.5 degrees C)      INITIAL Post-op Vital signs: No vitals data found for the desired time range.

## 2021-07-18 NOTE — Op Note (Signed)
Operative Note    Patient: Sharon Sanders MRN: [id number]  Surgery Date: 07/18/2021  @[email or web address] @          Procedure  Primary Surgeon    LEFT TKA (INPT)  , MD    * Panel 2 does not exist *  * Panel 2 does not exist *    * Panel 3 does not exist *  * Panel 3 does not exist *     Surgeon(s) and Role:     * Eulis Canner, MD - Primary    Other OR Staff/Assistants:  Circ-1: Eulis Canner, RN  Registered Nurse First Assistant: Jerolyn Center  Scrub Tech-1: Anabel Halon Cox  Scrub Tech-2: Venetia Night RN-1: Lynita Lombard  Float Staff: Cyndia Bent    1st Assistant Tasks:  Closing    Pre-operative Diagnosis:  Osteoarthritis of left knee, unspecified osteoarthritis type [M17.12]    Post-operative Diagnosis: same as preop diagnosis    Anesthesia Type: Spinal     Findings: djd    Complications: No    EBL: 50 cc    Body mass index is 35.17 kg/m??.    Specimens: None    Implants       Type Not Specified    Cement Bne Gentam[employer] Hv R+G 40gm -- [place] R+G [employer] [place] - [id number] - Implanted   (Left) Knee      Inventory item: CEMENT BNE [facility] HV R+G 40GM -- [place] R+G [facility] Model/Cat number: [id number]    Manufacturer: [employer] Lot number: [id number]      As of 07/18/2021       Status: Implanted                      Component Fem Sz 4 L Knee Nar Post Stbl Cem Attune - 07/20/2021 - Implanted   (Left) Knee      Inventory item: COMPONENT FEM SZ 4 L KNEE NAR POST STBL CEM ATTUNE Model/Cat number: [id number]    Manufacturer: [employer] Lot [id number]    Device identifier: : [id number] Device identifier type: GS1      GUDID Information       Request status Successful        Brand name: ATTUNE Version/Model: 1504-10-124    Company name: [employer] (1503-07-14) MRI safety info as of 07/18/21: Labeling does not contain MRI Safety Information    Contains dry or latex rubber: No      GMDN P.T. name: Uncoated knee femur prosthesis, metallic                As of 07/20/21       Status:  Implanted                      Insert Tib Sz 4 Thk29mm Knee Post Stbl Rot Platfrm Attune - 11m - Implanted   (Left) Knee      Inventory item: INSERT TIB SZ 4 THK6MM KNEE POST STBL ROT PLATFRM ATTUNE Model/Cat number: [id number]    Manufacturer: [employer] Lot [id number]    Device identifier: : [id number] Device identifier type: GS1      GUDID Information       Request status Successful        Brand name: ATTUNE Version/Model: 1516-50-406    Company name: [employer] ([birth year]-01-1975) MRI safety info as of 07/18/21: Labeling does not contain MRI Safety Information    Contains dry or latex rubber:  No      GMDN P.T. name: Tibial insert                As of 07/18/2021       Status: Implanted                      Baseplate Tib Sz 3 Cem Rot Platfrm Knee Sys S + Attune - [id number] - Implanted   (Left) Knee      Inventory item: BASEPLATE TIB SZ 3 CEM ROT PLATFRM KNEE SYS S + ATTUNE Model/Cat number: [id number]    Manufacturer: [employer] Lot number: [id number]    Device identifier: [id number] Device identifier type: GS1      GUDID Information       Request status Successful        Brand name: ATTUNE Version/Model: 1506-80-003    Company name: [employer] ([place]) MRI safety info as of 07/18/21: Labeling does not contain MRI Safety Information    Contains dry or latex rubber: No      GMDN P.T. name: Uncoated knee tibia prosthesis, metallic                As of 07/37/1062       Status: Implanted                      Component Pat Dia65mm Knee Poly Dome Cem Medialized Attune - [id number] - Implanted   (Left) Knee      Inventory item: COMPONENT PAT DIA35MM KNEE POLY DOME CEM MEDIALIZED ATTUNE Model/Cat number: [id number]    Manufacturer: [employer] Lot number: [id number]    Device identifier: [id number] Device identifier type: GS1      GUDID Information       Request status Successful        Brand name: ATTUNE Version/Model: 1518-20-035    Company name: [employer] ([place])  MRI safety info as of 07/18/21: Labeling does not contain MRI Safety Information    Contains dry or latex rubber: No      GMDN P.T. name: Polyethylene patella prosthesis                As of 07/18/2021       Status: Implanted                               Operative procedure: Total knee replacement    OPERATIVE PROCEDURE:  Please note the first assistant role was to help in patient positioning and draping of the extremity in a sterile fashion. Also during the surgery the assistant's responsibilities included but not limited to extremity positioning during critical portions of the surgery.  Assisting in using and placement of retractors during surgery.    Lower extremity was prepped and draped in a sterile fashion.  After adequate anesthesia was given, the patient was placed in a well-padded supine position.  Subvastus arthrotomy from the tibial tubercle to the superior pole of the patella was made.  Knee was hyperflexed.  Intramedullary reaming of distal femur and proximal tibia was performed.  10 mm of distal femur was cut.  Anterior-posterior sizing guide was used.  Anterior, posterior, chamfer cuts, and box cuts were made next.  Proximal tibial cut and preparation performed.  Posterior osteophyte meniscal remnants were removed, and also patella was everted.  Free-hand cut of the patella was made.  Trial components were placed.  The  patient was found to have excellent range of motion and stability with all trial components.  All the trial components removed.  Copious irrigation performed.  Distal femur, proximal tibia, and patella were impacted in place.  Excessive cement was removed.  After the cement was hard, Subvastus arthrotomy closed with Vicryl stitch.  Compressive dressing was applied.  The patient was taken to PACU in stable condition.      Please note due to the patient's BMI of greater than 30 significant surgical effort was required compared to the standard patient with a BMI lower than 30.  Surgical  time increased approximately 30% from the normal surgical time due to the patient's high BMI.  Because of the high BMI patient's knee would be considered a complex total knee replacement rather than a standard total knee replacement.      Eulis Canner, MD

## 2021-07-18 NOTE — Progress Notes (Signed)
Problem: Falls - Risk of  Goal: *Absence of Falls  Description: Document Schmid Fall Risk and appropriate interventions in the flowsheet.  Outcome: Progressing Towards Goal  Note: Fall Risk Interventions:            Medication Interventions: Teach patient to arise slowly

## 2021-07-18 NOTE — Anesthesia Pre-Procedure Evaluation (Signed)
Relevant Problems   No relevant active problems       Anesthetic History   No history of anesthetic complications            Review of Systems / Medical History  Patient summary reviewed, nursing notes reviewed and pertinent labs reviewed    Pulmonary  Within defined limits                 Neuro/Psych   Within defined limits           Cardiovascular    Hypertension          Hyperlipidemia    Exercise tolerance: >4 METS     GI/Hepatic/Renal  Within defined limits              Endo/Other        Obesity and arthritis     Other Findings            Physical Exam    Airway  Mallampati: III  TM Distance: 4 - 6 cm  Neck ROM: normal range of motion   Mouth opening: Normal     Cardiovascular  Regular rate and rhythm,  S1 and S2 normal,  no murmur, click, rub, or gallop  Rhythm: regular  Rate: normal         Dental  No notable dental hx       Pulmonary  Breath sounds clear to auscultation               Abdominal  GI exam deferred       Other Findings            Anesthetic Plan    ASA: 2  Anesthesia type: regional and general - backup - saphenous block      Post-op pain plan if not by surgeon: peripheral nerve block single    Induction: Intravenous  Anesthetic plan and risks discussed with: Patient

## 2021-07-18 NOTE — H&P (Signed)
H&P by Demetria Pore,  MD at 07/18/21 1630                Author: Demetria Pore, MD  Service: Hospitalist  Author Type: Physician       Filed: 07/18/21 1632  Date of Service: 07/18/21 1630  Status: Signed          Editor: Demetria Pore, MD (Physician)                                                       HISTORY & PHYSICAL   Sharon Oregel [name] MD, Sharon Sanders   Sharon Sanders   Sharon Sanders, [place]              NAME: Sharon Sanders    DOB:  [birth year]    MRN:  [id number]       Date/Time:  07/18/2021 4:30 PM      Patient PCP: Sol Blazing, MD            Subjective:     CHIEF COMPLAINT: Left TKR      HISTORY OF PRESENT ILLNESS:      Sharon Sanders is a 65 y.o. female with history of hypertension, obesity, hyperlipidemia presenting status post left TKR.      Patient is alert and oriented x4 and has been at bedside.      Mild left knee pain however no chest pain, shortness of breath, nausea/vomiting or fever/chills noted.      Denies any previous history of coronary atherosclerosis or CVA.      Does state that she lives in [place] and likely would like to stay overnight to make sure she is safe enough to drive home in a.m.      We were asked to admit for work up and evaluation of the above problems.         Past Medical History:        Diagnosis  Date         ?  High cholesterol           ?  Hypertension              History reviewed. No pertinent surgical history.        Social History          Tobacco Use         ?  Smoking status:  Never     ?  Smokeless tobacco:  Never       Substance Use Topics         ?  Alcohol use:  Yes             Comment: occ              Family History         Problem  Relation  Age of Onset          ?  No Known Problems  Mother            ?  No Known Problems  Father            Allergies        Allergen  Reactions         ?  Sulfa (Sulfonamide Antibiotics)  Rash  Prior to Admission medications             Medication  Sig  Start Date  End Date  Taking?  Authorizing  Provider            ergocalciferol, vitamin D2, (VITAMIN D2 PO)  Vitamin D    4000 IU daily      Yes  Provider, Historical     aspirin 81 mg chewable tablet  Take 81 mg by mouth daily.      Yes  Provider, Historical     betamethasone valerate (VALISONE) 0.1 % topical cream  betamethasone valerate 0.1 % topical cream    APPLY THIN LAYER TOPICALLY TO THE AFFECTED AREA EVERY DAY      Yes  Provider, Historical     cholecalciferol (VITAMIN D3) (1000 Units /25 mcg) tablet  Take 2,000 Units by mouth daily.      Yes  Provider, Historical     fluticasone propionate (FLONASE) 50 mcg/actuation nasal spray  fluticasone propionate 50 mcg/actuation nasal spray,suspension    SHAKE LIQUID AND USE 1 SPRAY IN EACH NOSTRIL EVERY DAY      Yes  Provider, Historical            rosuvastatin (CRESTOR) 5 mg tablet  rosuvastatin 5 mg tablet    TAKE 1 TABLET BY MOUTH EVERY DAY      Yes  Provider, Historical            triamterene-hydroCHLOROthiazide (MAXZIDE) 37.5-25 mg per tablet  triamterene 37.5 mg-hydrochlorothiazide 25 mg tablet    TAKE 1 TABLET BY MOUTH EVERY DAY IN THE MORNING      Yes  Provider, Historical     ondansetron (ZOFRAN ODT) 4 mg disintegrating tablet  Take 1 Tablet by mouth every eight (8) hours as needed for Nausea, Vomiting or Nausea or Vomiting for up to 20 doses.  07/17/21      Karleen Hampshire, ARNP     cephALEXin (Keflex) 500 mg capsule  Take 1 Capsule by mouth every eight (8) hours for 3 days. Start antibiotics after surgery  07/17/21  07/20/21    Karleen Hampshire, ARNP     oxyCODONE-acetaminophen (Percocet) 5-325 mg per tablet  Take 1 Tablet by mouth every four to six (4-6) hours as needed for Pain for up to 14 days. Max Daily Amount: 6 Tablets.  07/17/21  07/31/21    Karleen Hampshire, ARNP            diclofenac sodium 20 mg/gram /actuation(2 %) sopm  by TransDERmal route.        Provider, Historical           REVIEW OF SYSTEMS:         Total of 12 systems reviewed as follows:        POSITIVE= underlined text  Negative = text  not underlined   General:  fever, chills, sweats, generalized weakness, weight loss/gain,       loss of appetite    Eyes:    blurred vision, eye pain, loss of vision, double vision   ENT:    rhinorrhea, pharyngitis    Respiratory:   cough, sputum production, SOB, DOE, wheezing, pleuritic pain    Cardiology:   chest pain, palpitations, orthopnea, PND, edema, syncope    Gastrointestinal:  abdominal pain , N/V, diarrhea, dysphagia, constipation, bleeding    Genitourinary:  frequency, urgency, dysuria, hematuria, incontinence    Muskuloskeletal :  arthralgia, myalgia, back pain   Hematology:  easy bruising, nose or gum bleeding, lymphadenopathy    Dermatological: rash, ulceration, pruritis, color change / jaundice   Endocrine:   hot flashes or polydipsia    Neurological:  headache, dizziness, confusion, focal weakness, paresthesia,      Speech difficulties, memory loss, gait difficulty   Psychological: Feelings of anxiety, depression, agitation        Objective:     VITALS:     Visit Vitals      BP  (!) 136/92 (BP 1 Location: Right upper arm, BP Patient Position: At rest)     Pulse  (!) 113     Temp  97 ??F (36.1 ??C)     Resp  18     Ht  5\' 2"  (1.575 m)     Wt  87.2 kg (192 lb 4.8 oz)     SpO2  97%        BMI  35.17 kg/m??              LAB DATA REVIEWED:     No results found for this or any previous visit (from the past 24 hour(s)).         PHYSICAL EXAM:      General:    Alert, cooperative, no distress, appears stated age.      HEENT: Atraumatic, anicteric sclerae, pink conjunctivae      No oral ulcers, mucosa moist, throat clear, dentition fair   Neck:  Supple, symmetrical,  thyroid: non tender   Lungs:   Clear to auscultation bilaterally.  No Wheezing or Rhonchi. No rales.   Chest wall:  No tenderness  No Accessory muscle use.   Heart:   Regular  rhythm,  No  murmur   No edema   Abdomen:   Soft, non-tender. Not distended.  Bowel sounds normal   Extremities: No cyanosis.  No clubbing,       Skin turgor normal, Capillary  refill normal, Radial dial pulse 2+   Skin:     Not pale.  Not Jaundiced  No rashes    Psych:  Good insight.  Not depressed.  Not anxious or agitated.   Neurologic: EOMs intact. No facial asymmetry. No aphasia or slurred speech. Symmetrical strength, Sensation grossly intact. Alert and oriented X 4.         ______________________________________________________________________   Given the patient's current clinical presentation, I have a high level of concern for decompensation if discharged from the emergency department.  Complex decision making was performed, which includes  reviewing the patient's available past medical records, laboratory results, and x-ray films.         My assessment of this patient's clinical condition and my plan of care is as follows.      Assessment / Plan:      Hypertension   Hold triamterene/HCTZ currently.   Check BMP in AM.      Hyperlipidemia   Restart statin upon discharge home.      Status post left TKR   Management per orthopedic surgery.   Aspirin 325 mg twice daily for DVT prophylaxis per orthopedic surgery.   PT tolerated fairly well and likely can be discharged home in a.m. as patient lives in .      _______________________________________________________________________   Care Plan discussed with:           Comments         Patient  x           Family  RN         Care Manager                            Consultant:          _______________________________________________________________________   Expected  Disposition:       Home with Family  x        HH/PT/OT/RN       SNF/LTC          [facility]       ________________________________________________________________________   TOTAL TIME:  30 Minutes      Critical Care Provided     Minutes non procedure based              Comments             Reviewed previous records         >50% of visit spent in counseling and coordination of care  x  Discussion with patient and/or family and questions answered            ________________________________________________________________________   Signed: Demetria Pore, MD      Procedures: see electronic medical records for all procedures/Xrays and details which were not copied into this note but were reviewed prior to creation of Plan.

## 2021-07-18 NOTE — Anesthesia Procedure Notes (Signed)
Peripheral Block    Start time: 07/18/2021 9:36 AM  End time: 07/18/2021 9:42 AM  Performed by: Donn Pierini, CRNA  Authorized by: Donn Pierini, CRNA       Pre-procedure:   Indications: at surgeon's request and post-op pain management    Preanesthetic Checklist: patient identified, risks and benefits discussed, site marked, timeout performed, anesthesia consent given, patient being monitored and fire risk safety assessment completed and verbalized    Timeout Time: 09:36 EDT Thersa Salt RN)      Block Type:   Block Type:  Adductor canal block  Laterality:  Left  Monitoring:  Standard ASA monitoring, responsive to questions, oxygen, continuous pulse ox, frequent vital sign checks and heart rate  Injection Technique:  Single shot  Procedures: ultrasound guided    Patient Position: supine  Prep: chlorhexidine    Location:  Mid thigh  Needle Type:  Ultraplex  Needle Gauge:  20 G  Needle Localization:  Ultrasound guidance  Medication Injected:  Midazolam (VERSED) injection - IntraVENous   4 mg - 07/18/2021 9:36:00 AM  bupivacaine (PF) (MARCAINE) 0.5% injection - Peripheral Nerve Block   20 mL - 07/18/2021 9:42:00 AM  dexamethasone (DECADRON) 4 mg/mL injection - Peripheral Nerve Block   4 mg - 07/18/2021 9:42:00 AM  Med Admin Time: 07/18/2021 9:42 AM    Assessment:  Number of attempts:  1  Injection Assessment:  Incremental injection every 5 mL, local visualized surrounding nerve on ultrasound, negative aspiration for blood, no paresthesia, no intravascular symptoms and ultrasound image on chart  Patient tolerance:  Patient tolerated the procedure well with no immediate complications

## 2021-07-18 NOTE — Anesthesia Procedure Notes (Signed)
Spinal Block    Start time: 07/18/2021 10:05 AM  End time: 07/18/2021 10:18 AM  Performed by: Donn Pierini, CRNA  Authorized by: Donn Pierini, CRNA     Pre-procedure:  Indications: at surgeon's request and primary anesthetic  Preanesthetic Checklist: patient identified, risks and benefits discussed, anesthesia consent, site marked, patient being monitored, timeout performed and fire risk safety assessment completed and verbalized    Timeout Time: 10:04 EDT Jerolyn Center RN)      Spinal Block:   Patient Position:  Seated  Prep Region:  Lumbar  Prep: Betadine and patient draped      Location:  L3-4  Technique:  Single shot  Local: bupivacaine (PF) (MARCAINE) 0.75 % (7.5 mg/mL) Intrathecal - Intrathecal   12 mg - 07/18/2021 10:18:00 AM    Med Admin Time: 07/18/2021 10:18 AM    Needle:   Needle Type:  [place]  Needle Gauge:  25 G        Events: CSF confirmed, no blood with aspiration and no paresthesia        Assessment:  Insertion:  Uncomplicated  Patient tolerance:  Patient tolerated the procedure well with no immediate complications

## 2021-07-18 NOTE — Progress Notes (Signed)
PRE-OP HISTORY AND PHYSICAL    Subjective:     Patient is a 65 y.o. African American female presented with a history of left knee OA.  Onset of symptoms was gradual with gradually worsening course since that time. She is being admitted for surgical management of this condition. The indications for the procedure include severe end-stage osteoarthritis.    Patient Active Problem List    Diagnosis Date Noted    OA (osteoarthritis) of knee 07/18/2021     Past Medical History:   Diagnosis Date    High cholesterol     Hypertension       History reviewed. No pertinent surgical history.   Prior to Admission medications    Medication Sig Start Date End Date Taking? Authorizing Provider   ergocalciferol, vitamin D2, (VITAMIN D2 PO) Vitamin D   4000 IU daily   Yes Provider, Historical   aspirin 81 mg chewable tablet Take 81 mg by mouth daily.   Yes Provider, Historical   betamethasone valerate (VALISONE) 0.1 % topical cream betamethasone valerate 0.1 % topical cream   APPLY THIN LAYER TOPICALLY TO THE AFFECTED AREA EVERY DAY   Yes Provider, Historical   cholecalciferol (VITAMIN D3) (1000 Units /25 mcg) tablet Take 2,000 Units by mouth daily.   Yes Provider, Historical   fluticasone propionate (FLONASE) 50 mcg/actuation nasal spray fluticasone propionate 50 mcg/actuation nasal spray,suspension   SHAKE LIQUID AND USE 1 SPRAY IN EACH NOSTRIL EVERY DAY   Yes Provider, Historical   rosuvastatin (CRESTOR) 5 mg tablet rosuvastatin 5 mg tablet   TAKE 1 TABLET BY MOUTH EVERY DAY   Yes Provider, Historical   triamterene-hydroCHLOROthiazide (MAXZIDE) 37.5-25 mg per tablet triamterene 37.5 mg-hydrochlorothiazide 25 mg tablet   TAKE 1 TABLET BY MOUTH EVERY DAY IN THE MORNING   Yes Provider, Historical   ondansetron (ZOFRAN ODT) 4 mg disintegrating tablet Take 1 Tablet by mouth every eight (8) hours as needed for Nausea, Vomiting or Nausea or Vomiting for up to 20 doses. 07/17/21   Karleen Hampshire, ARNP   cephALEXin (Keflex) 500 mg capsule  Take 1 Capsule by mouth every eight (8) hours for 3 days. Start antibiotics after surgery 07/17/21 07/20/21  Karleen Hampshire, ARNP   oxyCODONE-acetaminophen (Percocet) 5-325 mg per tablet Take 1 Tablet by mouth every four to six (4-6) hours as needed for Pain for up to 14 days. Max Daily Amount: 6 Tablets. 07/17/21 07/31/21  Karleen Hampshire, ARNP   diclofenac sodium 20 mg/gram /actuation(2 %) sopm by TransDERmal route.  Patient not taking: Reported on 07/18/2021    Provider, Historical   naproxen sodium (NAPROSYN) 220 mg tablet Take 220 mg by mouth two (2) times daily (with meals).  Patient not taking: Reported on 07/18/2021  07/18/21  Provider, Historical     Allergies   Allergen Reactions    Sulfa (Sulfonamide Antibiotics) Rash      Social History     Tobacco Use    Smoking status: Never    Smokeless tobacco: Never   Substance Use Topics    Alcohol use: Yes     Comment: occ      Family History   Problem Relation Age of Onset    No Known Problems Mother     No Known Problems Father       Review of Systems  A comprehensive review of systems was negative except for that written in the HPI.    Objective:     Patient Vitals for the  past 8 hrs:   BP Temp Pulse Resp SpO2 Height Weight   07/18/21 0746 -- -- -- -- -- 5\' 2"  (1.575 m) 192 lb 4.8 oz (87.2 kg)   07/18/21 0729 (!) 161/103 98.4 ??F (36.9 ??C) (!) 105 16 97 % -- --     Visit Vitals  BP (!) 161/103 (BP 1 Location: Right upper arm, BP Patient Position: At rest;Sitting)   Pulse (!) 105   Temp 98.4 ??F (36.9 ??C)   Resp 16   Ht 5\' 2"  (1.575 m)   Wt 192 lb 4.8 oz (87.2 kg)   SpO2 97%   BMI 35.17 kg/m??     General appearance: alert, cooperative, no distress, appears stated age  Lungs: clear to auscultation bilaterally  Heart: regular rate and rhythm, S1, S2 normal, no murmur, click, rub or gallop  Abdomen: soft, non-tender. Bowel sounds normal. No masses,  no organomegaly  Extremities: extremities normal, atraumatic, no cyanosis or edema    Imaging Review  tricompartment  degenerative change, most prominent in the   medial and patellofemoral compartments    Assessment:     Active Problems:    OA (osteoarthritis) of knee (07/18/2021)        Plan:     The various methods of treatment have been discussed with the patient and family.   After consideration of risks, benefits and other options for treatment, the patient has consented to surgical interventions (left total knee arthroplasty).  Questions were answered and Pre-op teaching was done by , C-NP.

## 2021-07-19 LAB — BASIC METABOLIC PANEL
Anion Gap: 9 mmol/L (ref 3.0–18.0)
BUN: 13 mg/dL (ref 7–18)
Bun/Cre Ratio: 17 (ref 12–20)
CO2: 25 mmol/L (ref 21–32)
Calcium: 9.2 mg/dL (ref 8.5–10.1)
Chloride: 103 mmol/L (ref 100–111)
Creatinine: 0.76 mg/dL (ref 0.60–1.30)
ESTIMATED GLOMERULAR FILTRATION RATE: >60 mL/min/{1.73_m2} (ref >60)
Glucose: 214 mg/dL — ABNORMAL HIGH (ref 74–99)
Potassium: 3.9 mmol/L (ref 3.5–5.5)
Sodium: 137 mmol/L (ref 136–145)

## 2021-07-19 LAB — CBC WITH AUTO DIFFERENTIAL
Basophils %: 0 % (ref 0–2)
Basophils Absolute: 0 10*3/uL (ref 0.0–0.1)
Eosinophils %: 0 % (ref 0–5)
Eosinophils Absolute: 0 10*3/uL (ref 0.0–0.4)
Granulocyte Absolute Count: 0 10*3/uL (ref 0.00–0.04)
Hematocrit: 32.9 % — ABNORMAL LOW (ref 35.0–45.0)
Hemoglobin: 10.6 g/dL — ABNORMAL LOW (ref 12.0–16.0)
Immature Granulocytes: 0 % (ref 0–0.5)
Lymphocytes %: 10 % — ABNORMAL LOW (ref 21–52)
Lymphocytes Absolute: 1.1 10*3/uL (ref 0.9–3.6)
MCH: 26.8 PG (ref 24.0–34.0)
MCHC: 32.2 g/dL (ref 31.0–37.0)
MCV: 83.1 FL (ref 78.0–100.0)
MPV: 10.5 FL (ref 9.2–11.8)
Monocytes %: 9 % (ref 3–10)
Monocytes Absolute: 1 10*3/uL (ref 0.05–1.2)
NRBC Absolute: 0 10*3/uL (ref 0.00–0.01)
Neutrophils %: 81 % — ABNORMAL HIGH (ref 40–73)
Neutrophils Absolute: 9.1 10*3/uL — ABNORMAL HIGH (ref 1.8–8.0)
Nucleated RBCs: 0 PER 100 WBC
Platelets: 186 10*3/uL (ref 135–420)
RBC: 3.96 M/uL — ABNORMAL LOW (ref 4.20–5.30)
RDW: 13.8 % (ref 11.6–14.5)
WBC: 11.3 10*3/uL (ref 4.6–13.2)

## 2021-07-19 LAB — METABOLIC PANEL, BASIC
Anion gap: 9 mmol/L (ref 3.0–18.0)
BUN/Creatinine ratio: 17 (ref 12–20)
BUN: 13 mg/dL (ref 7–18)
CO2: 25 mmol/L (ref 21–32)
Calcium: 9.2 mg/dL (ref 8.5–10.1)
Chloride: 103 mmol/L (ref 100–111)
Creatinine: 0.76 mg/dL (ref 0.60–1.30)
Glucose: 214 mg/dL — ABNORMAL HIGH (ref 74–99)
Potassium: 3.9 mmol/L (ref 3.5–5.5)
Sodium: 137 mmol/L (ref 136–145)
eGFR: >60 mL/min/{1.73_m2} (ref >60)

## 2021-07-19 LAB — CBC WITH AUTOMATED DIFF
ABS. BASOPHILS: 0 10*3/uL (ref 0.0–0.1)
ABS. EOSINOPHILS: 0 10*3/uL (ref 0.0–0.4)
ABS. IMM. GRANS.: 0 10*3/uL (ref 0.00–0.04)
ABS. LYMPHOCYTES: 1.1 10*3/uL (ref 0.9–3.6)
ABS. MONOCYTES: 1 10*3/uL (ref 0.05–1.2)
ABS. NEUTROPHILS: 9.1 10*3/uL — ABNORMAL HIGH (ref 1.8–8.0)
ABSOLUTE NRBC: 0 10*3/uL (ref 0.00–0.01)
BASOPHILS: 0 % (ref 0–2)
EOSINOPHILS: 0 % (ref 0–5)
HCT: 32.9 % — ABNORMAL LOW (ref 35.0–45.0)
HGB: 10.6 g/dL — ABNORMAL LOW (ref 12.0–16.0)
IMMATURE GRANULOCYTES: 0 % (ref 0–0.5)
LYMPHOCYTES: 10 % — ABNORMAL LOW (ref 21–52)
MCH: 26.8 PG (ref 24.0–34.0)
MCHC: 32.2 g/dL (ref 31.0–37.0)
MCV: 83.1 FL (ref 78.0–100.0)
MONOCYTES: 9 % (ref 3–10)
MPV: 10.5 FL (ref 9.2–11.8)
NEUTROPHILS: 81 % — ABNORMAL HIGH (ref 40–73)
NRBC: 0 PER 100 WBC
PLATELET: 186 10*3/uL (ref 135–420)
RBC: 3.96 M/uL — ABNORMAL LOW (ref 4.20–5.30)
RDW: 13.8 % (ref 11.6–14.5)
WBC: 11.3 10*3/uL (ref 4.6–13.2)

## 2021-07-19 MED FILL — ASPIRIN 325 MG TAB, DELAYED RELEASE: 325 mg | ORAL | Qty: 1

## 2021-07-19 MED FILL — OXYCODONE-ACETAMINOPHEN 5 MG-325 MG TAB: 5-325 mg | ORAL | Qty: 2

## 2021-07-19 MED FILL — GERI-KOT 8.6 MG TABLET: 8.6 mg | ORAL | Qty: 1

## 2021-07-19 MED FILL — CEFAZOLIN 2 GRAM SOLUTION FOR INJECTION: 2 gram | INTRAMUSCULAR | Qty: 2000

## 2021-07-19 NOTE — Progress Notes (Signed)
 Problem: Mobility Impaired (Adult and Pediatric)  Goal: *Acute Goals and Plan of Care (Insert Text)  Description: Physical Therapy Goals  Initiated 07/18/2021 and to be accomplished within 1 day(s)  1.  Patient will move from supine to sit and sit to supine , scoot up and down, and roll side to side in bed with modified independence.    2.  Patient will transfer from bed to chair and chair to bed with modified independence using the least restrictive device.  3.  Patient will perform sit to stand with modified independence.  4.  Patient will ambulate with modified independence for 200 feet with the least restrictive device.   5.  Patient will ascend/descend 4 stairs with 2 handrail(s) with modified independence.    PLOF: [employer], cane, (I) ADLs.     Outcome: Progressing Towards Goal   PHYSICAL THERAPY TREATMENT    Patient: Sharon Sanders (65 y.o. female)  Date: 07/19/2021   Start Time: 0902   Stop Time: 0925     $$ Gait Training: 23-37 mins    Primary Diagnosis: Osteoarthritis of left knee, unspecified osteoarthritis type [M17.12]  OA (osteoarthritis) of knee [M17.9]  S/P total knee arthroplasty, left [Z96.652]  Procedure(s) (LRB):  LEFT TKA (INPT) (Left) 1 Day Post-Op   Precautions:  WBAT    ASSESSMENT :  Pt was eager to participate. She performed one bout of gait followed by step negotiation mod I. Pt is requesting a RW for home discussed this with case management. See below for treatment details.    Progression toward goals: all goals met   [x]       Improving appropriately and progressing toward goals  []       Improving slowly and progressing toward goals  []       Not making progress toward goals and plan of care will be adjusted        PLAN :  Patient continues to benefit from skilled intervention to address the above impairments.  Continue treatment per established plan of care.    Recommendations and Planned Interventions:   Patient to be seen 1-2x/day, 4-7 days/wk.  Discharge Recommendations:  Outpatient  AM-PAC: 19/24  Further Equipment Recommendations for Discharge: RW     SUBJECTIVE:   Patient states "I can feel my legs today    OBJECTIVE DATA SUMMARY:      07/18/21 1752   Gross Assessment   AROM Generally decreased, functional  (4-112)   PROM Generally decreased, functional  (0-110)   Bed Mobility   Rolling Modified independent   Supine to Sit Modified independent   Sit to Supine Modified independent   Scooting Modified independent   Balance   Sitting Intact   Standing Intact   Standing - Static Good   Standing - Dynamic  Good   Transfers   Sit to Stand Modified independent   Stand to Sit Modified independent   Stand Pivot Transfers Modified independent   Gait   Ambulation - Level of Assistance Modified independent   Distance (ft) 780 Feet (ft)   Assistive Device Walker, rolling   Stairs - Level of Assistance Modified independent   Number of Stairs Trained 4   Rail Use Both  (non reciprocal)   Gait Abnormalities Antalgic   Skin Integumentary   Skin Integrity   (left knee)   Weight Bearing Status   Left Side Weight Bearing As tolerated     Pain:  Pain level pre-treatment: 4/10   Pain level post-treatment: 4/10  Pain Location: L knee  Pain Intervention(s): Medication (see MAR); Rest, Ice, Repositioning   Response to intervention: Nurse notified, See doc flow    Activity Tolerance:   Good  Please refer to the flowsheet for vital signs taken during this treatment.  After treatment:   []          Patient left in no apparent distress sitting up in chair  [x]          Patient left in no apparent distress in bed  [x]          Call bell left within reach  [x]          Nursing notified  [x]          Husband present  []          Bed alarm activated  []          SCDs applied    COMMUNICATION/EDUCATION:   [x]          Role of Physical Therapy in the acute care setting.  [x]          Fall prevention education was provided and the patient/caregiver indicated understanding.  [x]          Patient/family have participated as  able in goal setting and plan of care.  [x]          Patient/family agree to work toward stated goals and plan of care.  []          Patient understands intent and goals of therapy, but is neutral about his/her participation.  []          Patient is unable to participate in goal setting/plan of care: ongoing with therapy staff.  []          Other:  .  Vernell LELON Abbot, PTA   Time Calculation: 23 mins

## 2021-07-19 NOTE — Progress Notes (Signed)
Care Management Interventions  PCP Verified by CM: Yes  Palliative Care Criteria Met (RRAT>21 & CHF Dx)?: No  Transition of Care Consult (CM Consult): Discharge Planning  Physical Therapy Consult: Yes  Occupational Therapy Consult: No  Speech Therapy Consult: No  Support Systems: Spouse/Significant Other  Confirm Follow Up Transport: Family  The Plan for Transition of Care is Related to the Following Treatment Goals : Patient centered discharge planning to ensure smooth transition to community and PLOF.  Discharge Location  Patient Expects to be Discharged to:: Home with outpatient services    Pt in OBS for s/p TKA.  Pt has OP PT, OP follow up appointments and walker given to pt prior to DC.  CM following for admission.  DC POC is to return home at DC with OP PT as planned prior to hospital stay.

## 2021-07-19 NOTE — Discharge Summary (Signed)
Discharge Summary       PATIENT ID: Sharon Sanders  MRN: [id number]   DATE OF BIRTH: 1955-12-08    DATE OF ADMISSION: 07/18/2021  6:42 AM    DATE OF DISCHARGE: 07/19/21    PRIMARY CARE PROVIDER: Other, Phys, MD     ATTENDING PHYSICIAN: Lavella Hammock, MD  DISCHARGING PROVIDER: Lavella Hammock, MD        CONSULTATIONS: IP CONSULT TO ORTHOPEDIC SURGERY  IP CONSULT TO HOSPITALIST    PROCEDURES/SURGERIES: Procedure(s) with comments:  LEFT TKA (INPT) - Left adductor canal block     ADMITTING DIAGNOSES & HOSPITAL COURSE:   Sharon Sanders is a 65 y.o. female with history of hypertension, obesity, hyperlipidemia presenting status post left TKR.     Patient is alert and oriented x4 and has been at bedside.     Mild left knee pain however no chest pain, shortness of breath, nausea/vomiting or fever/chills noted.     Denies any previous history of coronary atherosclerosis or CVA.     Does state that she lives in [place] and likely would like to stay overnight to make sure she is safe enough to drive home in a.m.     We were asked to admit for work up and evaluation of the above problems.         DISCHARGE DIAGNOSES / PLAN:      Assessment / Plan:     Hypertension  Hold triamterene/HCTZ currently.  Check BMP in AM.     Hyperlipidemia  Restart statin upon discharge home.     Status post left TKR  Management per orthopedic surgery.  Aspirin 325 mg twice daily for DVT prophylaxis per orthopedic surgery.      No complaints, no cp, no sob, no issues with urination.     FOLLOW UP APPOINTMENTS:    Follow-up Information       Follow up With Specialties Details Why Contact Info    Karleen Hampshire, ARNP Nurse Practitioner Follow up in 1 week(s) 07/25/21 @ 11:30 a.m. with Scotty Court, NP, [facility] [address]  [address]  [place] [address]  [phone]      Other, Phys, MD Neurology   Patient can only remember the practice name and not the physician                 DIET: Resume previous diet        DISCHARGE MEDICATIONS:  Discharge  Medication List as of 07/18/2021 11:19 AM        CONTINUE these medications which have NOT CHANGED    Details   ergocalciferol, vitamin D2, (VITAMIN D2 PO) Vitamin D   4000 IU daily, Historical Med      ondansetron (ZOFRAN ODT) 4 mg disintegrating tablet Take 1 Tablet by mouth every eight (8) hours as needed for Nausea, Vomiting or Nausea or Vomiting for up to 20 doses., Normal, Disp-20 Tablet, R-0      cephALEXin (Keflex) 500 mg capsule Take 1 Capsule by mouth every eight (8) hours for 3 days. Start antibiotics after surgery, Normal, Disp-9 Capsule, R-0      oxyCODONE-acetaminophen (Percocet) 5-325 mg per tablet Take 1 Tablet by mouth every four to six (4-6) hours as needed for Pain for up to 14 days. Max Daily Amount: 6 Tablets., Normal, Disp-30 Tablet, R-0Do no take until after surgery      aspirin 81 mg chewable tablet Take 81 mg by mouth daily., Historical Med  betamethasone valerate (VALISONE) 0.1 % topical cream betamethasone valerate 0.1 % topical cream   APPLY THIN LAYER TOPICALLY TO THE AFFECTED AREA EVERY DAY, Historical Med      cholecalciferol (VITAMIN D3) (1000 Units /25 mcg) tablet Take 2,000 Units by mouth daily., Historical Med      diclofenac sodium 20 mg/gram /actuation(2 %) sopm by TransDERmal route., Historical Med      fluticasone propionate (FLONASE) 50 mcg/actuation nasal spray fluticasone propionate 50 mcg/actuation nasal spray,suspension   SHAKE LIQUID AND USE 1 SPRAY IN EACH NOSTRIL EVERY DAY, Historical Med      rosuvastatin (CRESTOR) 5 mg tablet rosuvastatin 5 mg tablet   TAKE 1 TABLET BY MOUTH EVERY DAY, Historical Med      triamterene-hydroCHLOROthiazide (MAXZIDE) 37.5-25 mg per tablet triamterene 37.5 mg-hydrochlorothiazide 25 mg tablet   TAKE 1 TABLET BY MOUTH EVERY DAY IN THE MORNING, Historical Med               NOTIFY YOUR PHYSICIAN FOR ANY OF THE FOLLOWING:   Fever over 101 degrees for 24 hours.   Chest pain, shortness of breath, fever, chills, nausea, vomiting, diarrhea,  change in mentation, falling, weakness, bleeding. Severe pain or pain not relieved by medications.  Or, any other signs or symptoms that you may have questions about.    DISPOSITION:home    Home With:   OT  PT  HH  RN       Long term SNF/Inpatient Rehab    Independent/assisted living    Hospice    Other:       PATIENT CONDITION AT DISCHARGE:     Functional status    Poor     Deconditioned    x Independent      Cognition   x  Lucid     Forgetful     Dementia      Catheters/lines (plus indication)    Foley     PICC     PEG    x None      Code status   x  Full code     DNR      PHYSICAL EXAMINATION AT DISCHARGE:  General:          Alert, cooperative, no distress, appears stated age.     HEENT:           Atraumatic, anicteric sclerae, pink conjunctivae                          No oral ulcers, mucosa moist, throat clear, dentition fair  Neck:               Supple, symmetrical  Lungs:             Clear to auscultation bilaterally.  No Wheezing or Rhonchi. No rales.  Chest wall:      No tenderness  No Accessory muscle use.  Heart:              Regular  rhythm,  No  murmur   No edema  Abdomen:        Soft, non-tender. Not distended.  Bowel sounds normal  Extremities:     No cyanosis.  No clubbing,                            Skin turgor normal, Capillary refill normal  Skin:  Not pale.  Not Jaundiced  No rashes   Psych:             Not anxious or agitated.  Neurologic:      Alert, moves all extremities, answers questions appropriately and responds to commands       CHRONIC MEDICAL DIAGNOSES:  Problem List as of 07/19/2021 Date Reviewed: 02-Aug-2021            Codes Class Noted - Resolved    OA (osteoarthritis) of knee ICD-10-CM: M17.9  ICD-9-CM: 715.36  08/02/21 - Present        S/P total knee arthroplasty, left ICD-10-CM: H60.677  ICD-9-CM: V43.65  08/02/21 - Present           Greater than 35 minutes were spent with the patient on counseling and coordination of care    Signed:   Lavella Hammock,  MD  07/19/2021  1:47 PM

## 2021-07-19 NOTE — Progress Notes (Signed)
Subjective:     Post-Operative Day: 1 Status Post left Total Knee Arthroplasty  Systemic or Specific Complaints:No Complaints    Objective:     Patient Vitals for the past 24 hrs:   BP Temp Pulse Resp SpO2   07/19/21 0533 126/79 98.4 ??F (36.9 ??C) 99 18 98 %   07/19/21 0035 132/88 98.2 ??F (36.8 ??C) (!) 102 18 98 %   07/18/21 2004 131/73 97.4 ??F (36.3 ??C) (!) 119 18 97 %   07/18/21 1624 (!) 136/92 97 ??F (36.1 ??C) (!) 113 18 97 %   07/18/21 1235 (!) 142/75 97.5 ??F (36.4 ??C) 85 18 96 %   07/18/21 1225 135/89 97.6 ??F (36.4 ??C) 86 16 95 %   07/18/21 1210 (!) 145/72 -- 88 15 96 %   07/18/21 1205 (!) 145/64 -- 91 16 96 %   07/18/21 1200 (!) 144/87 -- 93 19 97 %   07/18/21 1155 (!) 141/82 97.9 ??F (36.6 ??C) 89 14 99 %   07/18/21 1135 126/86 97.5 ??F (36.4 ??C) 87 16 99 %   07/18/21 0957 (!) 141/81 -- 90 16 100 %   07/18/21 0952 (!) 144/85 -- 91 16 100 %   07/18/21 0947 136/83 -- 92 15 100 %   07/18/21 0942 (!) 146/92 -- 93 16 98 %       General: alert, cooperative, no distress, appears stated age   Wound: Wound clean and dry no evidence of infection.   Motion: +SLR   DVT Exam: No evidence of DVT seen on physical exam.     Data Review:    Recent Results (from the past 24 hour(s))   METABOLIC PANEL, BASIC    Collection Time: 07/19/21  4:42 AM   Result Value Ref Range    Sodium 137 136 - 145 mmol/L    Potassium 3.9 3.5 - 5.5 mmol/L    Chloride 103 100 - 111 mmol/L    CO2 25 21 - 32 mmol/L    Anion gap 9 3.0 - 18.0 mmol/L    Glucose 214 (H) 74 - 99 mg/dL    BUN 13 7 - 18 mg/dL    Creatinine 0.76 0.60 - 1.30 mg/dL    BUN/Creatinine ratio 17 12 - 20      eGFR >60 >60 ml/min/1.12m    Calcium 9.2 8.5 - 10.1 mg/dL   CBC WITH AUTOMATED DIFF    Collection Time: 07/19/21  4:42 AM   Result Value Ref Range    WBC 11.3 4.6 - 13.2 K/uL    RBC 3.96 (L) 4.20 - 5.30 M/uL    HGB 10.6 (L) 12.0 - 16.0 g/dL    HCT 32.9 (L) 35.0 - 45.0 %    MCV 83.1 78.0 - 100.0 FL    MCH 26.8 24.0 - 34.0 PG    MCHC 32.2 31.0 - 37.0 g/dL    RDW 13.8 11.6 - 14.5 %     PLATELET 186 135 - 420 K/uL    MPV 10.5 9.2 - 11.8 FL    NRBC 0.0 0.0 PER 100 WBC    ABSOLUTE NRBC 0.00 0.00 - 0.01 K/uL    NEUTROPHILS 81 (H) 40 - 73 %    LYMPHOCYTES 10 (L) 21 - 52 %    MONOCYTES 9 3 - 10 %    EOSINOPHILS 0 0 - 5 %    BASOPHILS 0 0 - 2 %    IMMATURE GRANULOCYTES 0 0 - 0.5 %  ABS. NEUTROPHILS 9.1 (H) 1.8 - 8.0 K/UL    ABS. LYMPHOCYTES 1.1 0.9 - 3.6 K/UL    ABS. MONOCYTES 1.0 0.05 - 1.2 K/UL    ABS. EOSINOPHILS 0.0 0.0 - 0.4 K/UL    ABS. BASOPHILS 0.0 0.0 - 0.1 K/UL    ABS. IMM. GRANS. 0.0 0.00 - 0.04 K/UL    DF AUTOMATED           Assessment:     Status Post left Total Knee Arthroplasty. Doing well postoperatively.     Plan:     Discharge today, Return to Clinic: per follow up in instructions

## 2021-07-25 ENCOUNTER — Encounter: Attending: Nurse Practitioner | Primary: Family Medicine

## 2021-07-25 ENCOUNTER — Telehealth: Attending: Nurse Practitioner

## 2021-07-25 DIAGNOSIS — Z96652 Presence of left artificial knee joint: Secondary | ICD-10-CM

## 2021-07-25 MED ORDER — OXYCODONE-ACETAMINOPHEN 5 MG-325 MG TAB
5-325 mg | ORAL_TABLET | Freq: Four times a day (QID) | ORAL | 0 refills | Status: AC | PRN
Start: 2021-07-25 — End: 2021-08-08

## 2021-07-25 NOTE — Progress Notes (Signed)
Subjective:      Patient presents for postop care following left TKA. Surgery was on 07/18/2021.  Ambulating independently.   Pain is controlled with current analgesics.  Medication(s) being used: Percocet..    Objective:     There were no vitals taken for this visit.    General:  alert, cooperative, no distress, appears stated age   ROM: -5/105; good quad strength on extension   Incision:   healing well, no drainage, no erythema, incision well approximated, mild swelling     Assessment:     Doing well postoperatively.    Plan:     1. Continue PT.  2. Wound care/showering discussed.  3. Continue DVT prophylaxis as directed.  4. Follow up at 1 yr with Dr. Allena Katz for xray's of the left knee and as needed.[name], who was evaluated through a synchronous (real-time) audio-video encounter, and/or her healthcare decision maker, is aware that it is a billable service, with coverage as determined by her insurance carrier. She provided verbal consent to proceed: Yes, and patient identification was verified. It was conducted pursuant to the emergency declaration under the [employer] and the [employer], 1135 waiver authority and the [employer] Act. A caregiver was present when appropriate. Ability to conduct physical exam was limited. I was in the office. The patient was at home.

## 2021-08-09 MED ORDER — CEPHALEXIN 500 MG CAP
500 mg | ORAL_CAPSULE | Freq: Once | ORAL | 0 refills | Status: AC | PRN
Start: 2021-08-09 — End: 2021-08-09

## 2021-08-09 NOTE — Telephone Encounter (Signed)
Telephone Encounter by Karleen Hampshire, ARNP at 08/09/21 1533                Author: Karleen Hampshire, ARNP  Service: --  Author Type: Nurse Practitioner       Filed: 08/09/21 1533  Encounter Date: 08/09/2021  Status: Signed          Editor: Tollie Eth (Nurse Practitioner)          Alfred Levins R 08/09/2021  3:33 PM EST----- Message -----From: Jean Rosenthal CorleySent: 08/09/2021   3:32 PM ESTTo: [facility] NurseSubject: Dental Cleaning                               Do I need antibiotics for my upcoming dental appointment.  If so I need a prescription.  Please let me know the appointment is November 15th.

## 2021-08-21 ENCOUNTER — Encounter

## 2021-08-22 MED ORDER — GABAPENTIN 300 MG CAP
300 mg | ORAL_CAPSULE | Freq: Every evening | ORAL | 1 refills | Status: AC | PRN
Start: 2021-08-22 — End: ?

## 2021-08-22 NOTE — Telephone Encounter (Signed)
Telephone Encounter by Karleen Hampshire, ARNP at 08/22/21 [id number]                Author: Karleen Hampshire, ARNP  Service: --  Author Type: Nurse Practitioner       Filed: 08/22/21 0855  Encounter Date: 08/21/2021  Status: Signed          Editor: Tollie Eth (Nurse Practitioner)          Darlyn Chamber, [facility] 08/22/2021  8:23 AM EST----- Message -----From: Jean Rosenthal CorleySent: 08/21/2021   4:31 PM ESTTo: [facility] NurseSubject: Tingling                                      My leg has a tingling sensation from the cut to my ankle   What is causing this

## 2021-09-03 ENCOUNTER — Encounter

## 2021-09-04 MED ORDER — TRAMADOL 50 MG TAB
50 mg | ORAL_TABLET | Freq: Three times a day (TID) | ORAL | 0 refills | Status: AC | PRN
Start: 2021-09-04 — End: 2021-09-14

## 2021-09-18 NOTE — Progress Notes (Signed)
[facility] [place] Phone: [phone] Subjective:   Dawn [name], am serving as a scribe for Dr. Hulan Saas. This visit occurred during the SARS-CoV-2 public health emergency.  Safety protocols were in place, including screening questions prior to the visit, additional usage of staff PPE, and extensive cleaning of exam room while observing appropriate contact time as indicated for disinfecting solutions.  I'm seeing this patient by the request  of:  Kelton Pillar, MD  CC: Right knee pain, left shoulder pain  AJO:[id number]  03/28/2021 Patient at the moment seems to be doing relatively well.  We discussed may be a possible injection but with patient doing relatively well we will hold.  Patient will be having a replacement in October and we discussed the possibility ago that we should not do any other injections leading up to it.  She can check with the surgeon if necessary.  Follow-up with me again 2 months for the shoulder more than the knees.  Patient shoulder is much improved.  Discussed with patient about icing regimen and home exercises, increase activity slowly.  Patient will continue to monitor.  Follow-up with me again in 2 months.  May need injection because patient will be having a knee replacement in October.  Updated 09/19/2021 Dawn [name] is a 65 y.o. female coming in with complaint of anterior L shoulder pain that radiates down to the L thumb. Pain occurring for a few months. Progressed over past 6 weeks. Patient had TKR in October 19th. Was using walker for 2 weeks.   States that R knee is bothering her more now that she has L replaced. In mornings she feels stiff and notes popping throughout the day.   Also notes tingling in scar over TKR on L knee.        Past Medical History:  Diagnosis Date   Abnormal glucose    Allergic rhinitis    Allergy    Anxiety    [name] per pt   Arthritis    bilateral  knees- get cortisone injections   Asthma    as a child   Carpal tunnel syndrome    right   Cervical dysplasia    1996   Endometriosis    Fibroids    Hemorrhoids    High cholesterol    Hyperlipemia    Hypertension    Migraine    Neck pain    Stroke ([place]) 2014   TIA- negative work up in ED 2014   Transient cerebral ischemia    10-22-12 ED, tia s/s but negative work up in ED. see note    Past Surgical History:  Procedure Laterality Date   ABDOMINAL HYSTERECTOMY  Partial   CARPAL TUNNEL RELEASE Right    cold knife cone     for dysplasia   COLONOSCOPY     last 11-2008 w/[facility], [facility] only    KNEE ARTHROSCOPY WITH MEDIAL MENISECTOMY Right 03/28/2016   Procedure: RIGHT KNEE ARTHROSCOPY CHONDROPLASTY WITH MEDIAL MENISCECTOMY;  Surgeon: [name], MD;  Location: [facility];  Service: Orthopedics;  Laterality: Right;   TRIGGER FINGER RELEASE     thumb, middle finger    TUBAL LIGATION     Social History   Socioeconomic History   Marital status: Married    Spouse name: Not on file   Number of children: 1   Years of education: Not on file   Highest education level: Not on file  Occupational History   Occupation: Retired  Tobacco Use   Smoking status: Never   Smokeless tobacco: Never  Vaping Use   Vaping Use: Never used  Substance and Sexual Activity   Alcohol use: Yes    Alcohol/week: 1.0 standard drink    Types: 1 Glasses of wine per week    Comment: occasionally   Drug use: [name]   Sexual activity: Never  Other Topics Concern   Not on file  Social History Narrative   Not on file   Social Determinants of Health   Financial Resource Strain: Not on file  Food Insecurity: Not on file  Transportation Needs: Not on file  Physical Activity: Not on file  Stress: Not on file  Social Connections: Not on file   Allergies  Allergen Reactions   Sulfa Antibiotics Rash   Family History  Problem Relation Age of Onset   Colon cancer Father    Stomach  cancer Father    Diabetes Mother    Hypertension Mother    CVA Mother    Kidney disease Mother    Aneurysm Mother    Colon cancer Maternal Uncle    Breast cancer Maternal Aunt    Kidney disease Maternal Aunt    Heart disease Brother    Throat cancer Maternal Uncle    Rectal cancer Neg Hx    Esophageal cancer Neg Hx      Current Outpatient Medications (Cardiovascular):    rosuvastatin (CRESTOR) 5 MG tablet, Take 5 mg by mouth daily.   triamterene-hydrochlorothiazide (MAXZIDE-25) 37.5-25 MG per tablet, Take 1 tablet by mouth daily.  Current Outpatient Medications (Respiratory):    fexofenadine (ALLEGRA) 180 MG tablet, Take 180 mg by mouth daily.  Current Outpatient Medications (Analgesics):    Acetaminophen (TYLENOL PO), Take by mouth as needed.   aspirin 81 MG chewable tablet, Chew 81 mg by mouth daily.   naproxen sodium (ANAPROX) 220 MG tablet, Take 220 mg by mouth 2 (two) times daily with a meal.   Current Outpatient Medications (Other):    cholecalciferol (VITAMIN D) 1000 UNITS tablet, Take 2,000 Units by mouth daily.   cyclobenzaprine (FLEXERIL) 10 MG tablet, Take 10 mg by mouth 3 (three) times daily as needed. For headache   Diclofenac Sodium 2 % SOLN, Place onto the skin.   gabapentin (NEURONTIN) 300 MG capsule, Take 1 capsule (300 mg total) by mouth 3 (three) times daily as needed.   Multiple Vitamin (MULTIVITAMIN WITH MINERALS) TABS, Take 1 tablet by mouth daily.   Na Sulfate-K Sulfate-Mg Sulf (SUPREP BOWEL PREP KIT) 17.5-3.13-1.6 GM/177ML SOLN, Suprep as directed, [name] substitutions   OVER THE COUNTER MEDICATION, OTC laxative- unsure which kind- uses almost daily   Reviewed prior external information including notes and imaging from  primary care provider As well as notes that were available from care everywhere and other healthcare systems.  Past medical history, social, surgical and family history all reviewed in electronic medical record.  [name] pertanent information  unless stated regarding to the chief complaint.   Review of Systems:  [name] headache, visual changes, nausea, vomiting, diarrhea, constipation, dizziness, abdominal pain, skin rash, fevers, chills, night sweats, weight loss, swollen lymph nodes, body aches, joint swelling, chest pain, shortness of breath, mood changes. POSITIVE muscle aches  Objective  Blood pressure 118/90, pulse (!) 101, height $RemoveBef'5\' 2"'IuxxPesiSa$  (1.575 m), weight 185 lb (83.9 kg), SpO2 98 %.   General: [name] apparent distress alert and oriented x3 mood and affect normal, dressed appropriately.  HEENT:  Pupils equal, extraocular movements intact  Respiratory: Patient's speak in full sentences and does not appear short of breath  Cardiovascular: [name] lower extremity edema, non tender, [name] erythema  Gait mild antalgic.  Left knee does have a replacement.  Patient does have some warmness to touch.  [name] sign of any infectious etiology. Patient's left shoulder does have positive impingement noted.  Rotator cuff strength 4+ out of 5 compared to 5 out of 5 on the contralateral side.  Positive crossover and O'Brien's noted.  Limited muscular skeletal ultrasound was performed and interpreted by Hulan Saas, M  Limited ultrasound of the left shoulder shows the patient does have what appears to be a partial-thickness tear possibly high-grade of the supraspinatus with some retraction.  Appears to be acute with hypoechoic changes and increasing in neovascularization. Partial tear of the supraspinatus possible high-grade with some retraction  Procedure: Real-time Ultrasound Guided Injection of left glenohumeral joint Device: GE Logiq E  Ultrasound guided injection is preferred based studies that show increased duration, increased effect, greater accuracy, decreased procedural pain, increased response rate with ultrasound guided versus blind injection.  Verbal informed consent obtained.  Time-out conducted.  Noted [name] overlying erythema, induration, or other  signs of local infection.  Skin prepped in a sterile fashion.  Local anesthesia: Topical Ethyl chloride.  With sterile technique and under real time ultrasound guidance:  Joint visualized.  21g 2 inch needle inserted posterior approach. Pictures taken for needle placement. Patient did have injection of 2 cc of 0.5% Marcaine, and 1cc of Kenalog 40 mg/dL. Completed without difficulty  Pain immediately resolved suggesting accurate placement of the medication.  Advised to call if fevers/chills, erythema, induration, drainage, or persistent bleeding.  Impression: Technically successful ultrasound guided injection.  Procedure: Real-time Ultrasound Guided Injection of left acromioclavicular joint Device: GE Logiq Q7 Ultrasound guided injection is preferred based studies that show increased duration, increased effect, greater accuracy, decreased procedural pain, increased response rate, and decreased cost with ultrasound guided versus blind injection.  Verbal informed consent obtained.  Time-out conducted.  Noted [name] overlying erythema, induration, or other signs of local infection.  Skin prepped in a sterile fashion.  Local anesthesia: Topical Ethyl chloride.  With sterile technique and under real time ultrasound guidance: With a 25-gauge half inch needle injecting 0.5 cc of 0.5% Marcaine and 0.5 cc of Kenalog 40 mg/mL Completed without difficulty  Pain immediately resolved suggesting accurate placement of the medication.  Advised to call if fevers/chills, erythema, induration, drainage, or persistent bleeding.  Impression: Technically successful ultrasound guided injection.   Impression and Recommendations:     The above documentation has been reviewed and is accurate and complete Lyndal Pulley, DO

## 2021-09-19 ENCOUNTER — Ambulatory Visit (INDEPENDENT_AMBULATORY_CARE_PROVIDER_SITE_OTHER): Payer: Medicare Other | Admitting: Family Medicine

## 2021-09-19 ENCOUNTER — Ambulatory Visit: Payer: Self-pay

## 2021-09-19 ENCOUNTER — Encounter: Payer: Self-pay | Admitting: Family Medicine

## 2021-09-19 ENCOUNTER — Other Ambulatory Visit: Payer: Self-pay

## 2021-09-19 VITALS — BP 118/90 | HR 101 | Ht 62.0 in | Wt 185.0 lb

## 2021-09-19 DIAGNOSIS — G8929 Other chronic pain: Secondary | ICD-10-CM

## 2021-09-19 DIAGNOSIS — M170 Bilateral primary osteoarthritis of knee: Secondary | ICD-10-CM | POA: Diagnosis not present

## 2021-09-19 DIAGNOSIS — M7542 Impingement syndrome of left shoulder: Secondary | ICD-10-CM | POA: Diagnosis not present

## 2021-09-19 DIAGNOSIS — M25512 Pain in left shoulder: Secondary | ICD-10-CM | POA: Diagnosis not present

## 2021-09-19 NOTE — Patient Instructions (Signed)
Shoulder injections today Take 2 baby aspirin daily for your trip Knee has arthritis See me in 5 weeks

## 2021-09-20 NOTE — Assessment & Plan Note (Signed)
Patient has had a left-sided total knee replacement.  Likely will need a right-sided within the next couple years.  We will hold on any injection today but will consider in the follow-up.  Follow-up with me again in 6 weeks

## 2021-09-20 NOTE — Assessment & Plan Note (Signed)
Patient unfortunately since the knee replacement likely does have a partial rotator cuff tear noted on the ultrasound today.  Patient likely does not have any significant weakness.  Given injection in the glenohumeral as well as in the acromioclavicular.  Discussed icing regimen and home exercises.  Discussed which activities to do which wants to avoid.  Increase activity slowly.  Follow-up again in 6 to 8 weeks.

## 2021-10-05 ENCOUNTER — Ambulatory Visit: Payer: Medicare Other | Admitting: Family Medicine

## 2021-10-23 NOTE — Progress Notes (Signed)
Dawn [name] [address] [place] Phone: [phone] Subjective:   Dawn [name], am serving as a scribe for Dr. Hulan Saas. This visit occurred during the SARS-CoV-2 public health emergency.  Safety protocols were in place, including screening questions prior to the visit, additional usage of staff PPE, and extensive cleaning of exam room while observing appropriate contact time as indicated for disinfecting solutions.   I'm seeing this patient by the request  of:  Kelton Pillar, MD  CC: Left shoulder pain  [id number]  09/19/2021 Patient unfortunately since the knee replacement likely does have a partial rotator cuff tear noted on the ultrasound today.  Patient likely does not have any significant weakness.  Given injection in the glenohumeral as well as in the acromioclavicular.  Discussed icing regimen and home exercises.  Discussed which activities to do which wants to avoid.  Increase activity slowly.  Follow-up again in 6 to 8 weeks.  Patient has had a left-sided total knee replacement.  Likely will need a right-sided within the next couple years.  We will hold on any injection today but will consider in the follow-up.  Follow-up with me again in 6 weeks  Update 10/24/2021 Dawn [name] is a 66 y.o. female coming in with complaint of L shoulder and R knee pain. Patient states still having a a little issues with shoulder. Better since the last visit. Right knee feels like having cramps in calf. No other complaints.  Patient recently did have a [place] from [place] within the last week.     Past Medical History:  Diagnosis Date   Abnormal glucose    Allergic rhinitis    Allergy    Anxiety    no per pt   Arthritis    bilateral knees- get cortisone injections   Asthma    as a child   Carpal tunnel syndrome    right   Cervical dysplasia    1996   Endometriosis    Fibroids    Hemorrhoids    High cholesterol     Hyperlipemia    Hypertension    Migraine    Neck pain    Stroke ([facility]) 2014   TIA- negative work up in ED 2014   Transient cerebral ischemia    10-22-12 ED, tia s/s but negative work up in ED. see note    Past Surgical History:  Procedure Laterality Date   ABDOMINAL HYSTERECTOMY  Partial   CARPAL TUNNEL RELEASE Right    cold knife cone     for dysplasia   COLONOSCOPY     last 11-2008 w/[facility], [facility] only    KNEE ARTHROSCOPY WITH MEDIAL MENISECTOMY Right 03/28/2016   Procedure: RIGHT KNEE ARTHROSCOPY CHONDROPLASTY WITH MEDIAL MENISCECTOMY;  Surgeon: [name], MD;  Location: [facility];  Service: Orthopedics;  Laterality: Right;   TRIGGER FINGER RELEASE     thumb, middle finger    TUBAL LIGATION     Social History   Socioeconomic History   Marital status: Married    Spouse name: Not on file   Number of children: 1   Years of education: Not on file   Highest education level: Not on file  Occupational History   Occupation: Retired  Tobacco Use   Smoking status: Never   Smokeless tobacco: Never  Vaping Use   Vaping Use: Never used  Substance and Sexual Activity   Alcohol use: Yes    Alcohol/week: 1.0  standard drink    Types: 1 Glasses of wine per week    Comment: occasionally   Drug use: No   Sexual activity: Never  Other Topics Concern   Not on file  Social History Narrative   Not on file   Social Determinants of Health   Financial Resource Strain: Not on file  Food Insecurity: Not on file  Transportation Needs: Not on file  Physical Activity: Not on file  Stress: Not on file  Social Connections: Not on file   Allergies  Allergen Reactions   Sulfa Antibiotics Rash   Family History  Problem Relation Age of Onset   Colon cancer Father    Stomach cancer Father    Diabetes Mother    Hypertension Mother    CVA Mother    Kidney disease Mother    Aneurysm Mother    Colon cancer Maternal Uncle    Breast cancer Maternal Aunt     Kidney disease Maternal Aunt    Heart disease Brother    Throat cancer Maternal Uncle    Rectal cancer Neg Hx    Esophageal cancer Neg Hx      Current Outpatient Medications (Cardiovascular):    rosuvastatin (CRESTOR) 5 MG tablet, Take 5 mg by mouth daily.   triamterene-hydrochlorothiazide (MAXZIDE-25) 37.5-25 MG per tablet, Take 1 tablet by mouth daily.  Current Outpatient Medications (Respiratory):    fexofenadine (ALLEGRA) 180 MG tablet, Take 180 mg by mouth daily.  Current Outpatient Medications (Analgesics):    Acetaminophen (TYLENOL PO), Take by mouth as needed.   aspirin 81 MG chewable tablet, Chew 81 mg by mouth daily.   naproxen sodium (ANAPROX) 220 MG tablet, Take 220 mg by mouth 2 (two) times daily with a meal.   Current Outpatient Medications (Other):    cholecalciferol (VITAMIN D) 1000 UNITS tablet, Take 2,000 Units by mouth daily.   cyclobenzaprine (FLEXERIL) 10 MG tablet, Take 10 mg by mouth 3 (three) times daily as needed. For headache   Diclofenac Sodium 2 % SOLN, Place onto the skin.   gabapentin (NEURONTIN) 300 MG capsule, Take 1 capsule (300 mg total) by mouth 3 (three) times daily as needed.   Multiple Vitamin (MULTIVITAMIN WITH MINERALS) TABS, Take 1 tablet by mouth daily.   Na Sulfate-K Sulfate-Mg Sulf (SUPREP BOWEL PREP KIT) 17.5-3.13-1.6 GM/177ML SOLN, Suprep as directed, no substitutions   OVER THE COUNTER MEDICATION, OTC laxative- unsure which kind- uses almost daily   Reviewed prior external information including notes and imaging from  primary care provider As well as notes that were available from care everywhere and other healthcare systems.  Past medical history, social, surgical and family history all reviewed in electronic medical record.  No pertanent information unless stated regarding to the chief complaint.   Review of Systems:  No headache, visual changes, nausea, vomiting, diarrhea, constipation, dizziness, abdominal pain, skin rash,  fevers, chills, night sweats, weight loss, swollen lymph nodes, body aches, joint swelling, chest pain, shortness of breath, mood changes. POSITIVE muscle aches  Objective  Blood pressure 124/84, pulse 97, height $RemoveBe'5\' 2"'DlboXewmh$  (1.575 m), weight 188 lb (85.3 kg), SpO2 97 %.   General: No apparent distress alert and oriented x3 mood and affect normal, dressed appropriately.  HEENT: Pupils equal, extraocular movements intact  Respiratory: Patient's speak in full sentences and does not appear short of breath  Cardiovascular: No lower extremity edema, non tender, no erythema  Gait normal with good balance and coordination.  MSK: Patient's left shoulder still has  mild positive impingement noted.  Rotator cuff strength noted does appear to be intact with just some mild hesitation with empty can. Right calf does have significant pain in the middle of the calf.  Limited muscular skeletal ultrasound was performed and interpreted by Hulan Saas, M  Limited ultrasound of patient's left shoulder does still show that there is a possible partial tear noted of the rotator cuff.  Mild underlying arthritic changes of the glenohumeral joint.  In addition to this moderate to arthritic changes of the acromioclavicular joint with hypoechoic changes noted. Impression: Partial tearing of the rotator cuff with minimal change and underlying arthritis    Impression and Recommendations:    The above documentation has been reviewed and is accurate and complete Lyndal Pulley, DO

## 2021-10-24 ENCOUNTER — Other Ambulatory Visit: Payer: Self-pay

## 2021-10-24 ENCOUNTER — Ambulatory Visit (INDEPENDENT_AMBULATORY_CARE_PROVIDER_SITE_OTHER): Payer: Medicare Other | Admitting: Family Medicine

## 2021-10-24 ENCOUNTER — Encounter: Payer: Self-pay | Admitting: Family Medicine

## 2021-10-24 ENCOUNTER — Ambulatory Visit: Payer: Self-pay

## 2021-10-24 VITALS — BP 124/84 | HR 97 | Ht 62.0 in | Wt 188.0 lb

## 2021-10-24 DIAGNOSIS — M7542 Impingement syndrome of left shoulder: Secondary | ICD-10-CM

## 2021-10-24 DIAGNOSIS — S83241D Other tear of medial meniscus, current injury, right knee, subsequent encounter: Secondary | ICD-10-CM

## 2021-10-24 DIAGNOSIS — M79604 Pain in right leg: Secondary | ICD-10-CM

## 2021-10-24 NOTE — Assessment & Plan Note (Signed)
Patient is having right leg pain.  Unfortunately recently did have a long car regularly due to knee injury.  He is having worsening pain.  Discussed which activities to do which wants to avoid.  Due to the recent long car ride I do feel that a Doppler is necessary.  Otherwise on ultrasound here today patient does have the underlying arthritic changes of the knee and does have some findings that is consistent with some dehydration but otherwise fairly unremarkable.  Depending on findings we will discuss treatment otherwise.

## 2021-10-24 NOTE — Assessment & Plan Note (Signed)
Patient continues to have some difficulty.  At this moment we discussed the possibility of pulm sizes still.  Patient wants to avoid any advanced imaging because she would not want any surgical intervention.  Does still have findings that is consistent with a partial rotator cuff tear.  Patient is doing relatively well though otherwise.  Patient will follow up with me again in 6 to 8 weeks and may be repeat injections if needed.

## 2021-10-24 NOTE — Patient Instructions (Addendum)
Give the shoulder a little more time  If doppler negative drink more water See you again in 6-8 weeks

## 2021-11-01 ENCOUNTER — Ambulatory Visit (HOSPITAL_COMMUNITY)
Admission: RE | Admit: 2021-11-01 | Discharge: 2021-11-01 | Disposition: A | Discharge status: Home / Self Care | Payer: Medicare Other | Source: Ambulatory Visit | Attending: Cardiovascular Disease | Admitting: Cardiovascular Disease

## 2021-11-01 ENCOUNTER — Other Ambulatory Visit: Payer: Self-pay

## 2021-11-01 DIAGNOSIS — M79604 Pain in right leg: Secondary | ICD-10-CM | POA: Diagnosis present

## 2021-12-04 NOTE — Progress Notes (Signed)
?Charlann Boxer D.O. ?[facility] ?[place] ?Phone: [phone] ?Subjective:   ?I, Dawn [name], am serving as a Education administrator for Dr. Hulan Saas. ?This visit occurred during the SARS-CoV-2 public health emergency.  Safety protocols were in place, including screening questions prior to the visit, additional usage of staff PPE, and extensive cleaning of exam room while observing appropriate contact time as indicated for disinfecting solutions.  ? ?I'm seeing this patient by the request  of:  Kelton Pillar, MD ? ?CC: Right knee and left shoulder pain follow-up ? ?[id number]  ?10/24/2021 ?Patient continues to have some difficulty.  At this moment we discussed the possibility of pulm sizes still.  Patient wants to avoid any advanced imaging because she would not want any surgical intervention.  Does still have findings that is consistent with a partial rotator cuff tear.  Patient is doing relatively well though otherwise.  Patient will follow up with me again in 6 to 8 weeks and may be repeat injections if needed. ? ?Patient is having right leg pain.  Unfortunately recently did have a long car regularly due to knee injury.  He is having worsening pain.  Discussed which activities to do which wants to avoid.  Due to the recent long car ride I do feel that a Doppler is necessary.  Otherwise on ultrasound here today patient does have the underlying arthritic changes of the knee and does have some findings that is consistent with some dehydration but otherwise fairly unremarkable.  Depending on findings we will discuss treatment otherwise. ? ?Update 12/05/2021 ?Dawn [name] is a 66 y.o. female coming in with complaint of R knee and L shoulder pain. Patient states knee is about the same. Some days better than other. When she walks more her knee does swell. Shoulder still having a little issue. Can tell that it is getting better. Pain the other day went down to her thumb. No other  complaints. ? ? ?  ? ?Past Medical History:  ?Diagnosis Date  ? Abnormal glucose   ? Allergic rhinitis   ? Allergy   ? Anxiety   ? no per pt  ? Arthritis   ? bilateral knees- get cortisone injections  ? Asthma   ? as a child  ? Carpal tunnel syndrome   ? right  ? Cervical dysplasia   ? 1996  ? Endometriosis   ? Fibroids   ? Hemorrhoids   ? High cholesterol   ? Hyperlipemia   ? Hypertension   ? Migraine   ? Neck pain   ? Stroke [facility]) 2014  ? TIA- negative work up in ED 2014  ? Transient cerebral ischemia   ? 10-22-12 ED, tia s/s but negative work up in ED. see note   ? ?Past Surgical History:  ?Procedure Laterality Date  ? ABDOMINAL HYSTERECTOMY  Partial  ? CARPAL TUNNEL RELEASE Right   ? cold knife cone    ? for dysplasia  ? COLONOSCOPY    ? last 11-2008 w/[facility], [facility]ms only   ? KNEE ARTHROSCOPY WITH MEDIAL MENISECTOMY Right 03/28/2016  ? Procedure: RIGHT KNEE ARTHROSCOPY CHONDROPLASTY WITH MEDIAL MENISCECTOMY;  Surgeon: [name], MD;  Location: [facility];  Service: Orthopedics;  Laterality: Right;  ? TRIGGER FINGER RELEASE    ? thumb, middle finger   ? TUBAL LIGATION    ? ?Social History  ? ?Socioeconomic History  ? Marital status: Married  ?  Spouse name: Not on file  ?  Number of children: 1  ? Years of education: Not on file  ? Highest education level: Not on file  ?Occupational History  ? Occupation: Retired  ?Tobacco Use  ? Smoking status: Never  ? Smokeless tobacco: Never  ?Vaping Use  ? Vaping Use: Never used  ?Substance and Sexual Activity  ? Alcohol use: Yes  ?  Alcohol/week: 1.0 standard drink  ?  Types: 1 Glasses of wine per week  ?  Comment: occasionally  ? Drug use: No  ? Sexual activity: Never  ?Other Topics Concern  ? Not on file  ?Social History Narrative  ? Not on file  ? ?Social Determinants of Health  ? ?Financial Resource Strain: Not on file  ?Food Insecurity: Not on file  ?Transportation Needs: Not on file  ?Physical Activity: Not on file  ?Stress: Not on file  ?Social  Connections: Not on file  ? ?Allergies  ?Allergen Reactions  ? Sulfa Antibiotics Rash  ? ?Family History  ?Problem Relation Age of Onset  ? Colon cancer Father   ? Stomach cancer Father   ? Diabetes Mother   ? Hypertension Mother   ? CVA Mother   ? Kidney disease Mother   ? Aneurysm Mother   ? Colon cancer Maternal Uncle   ? Breast cancer Maternal Aunt   ? Kidney disease Maternal Aunt   ? Heart disease Brother   ? Throat cancer Maternal Uncle   ? Rectal cancer Neg Hx   ? Esophageal cancer Neg Hx   ? ? ? ?Current Outpatient Medications (Cardiovascular):  ?  rosuvastatin (CRESTOR) 5 MG tablet, Take 5 mg by mouth daily. ?  triamterene-hydrochlorothiazide (MAXZIDE-25) 37.5-25 MG per tablet, Take 1 tablet by mouth daily. ? ?Current Outpatient Medications (Respiratory):  ?  fexofenadine (ALLEGRA) 180 MG tablet, Take 180 mg by mouth daily. ? ?Current Outpatient Medications (Analgesics):  ?  Acetaminophen (TYLENOL PO), Take by mouth as needed. ?  aspirin 81 MG chewable tablet, Chew 81 mg by mouth daily. ?  naproxen sodium (ANAPROX) 220 MG tablet, Take 220 mg by mouth 2 (two) times daily with a meal. ? ? ?Current Outpatient Medications (Other):  ?  cholecalciferol (VITAMIN D) 1000 UNITS tablet, Take 2,000 Units by mouth daily. ?  cyclobenzaprine (FLEXERIL) 10 MG tablet, Take 10 mg by mouth 3 (three) times daily as needed. For headache ?  Diclofenac Sodium 2 % SOLN, Place onto the skin. ?  gabapentin (NEURONTIN) 300 MG capsule, Take 1 capsule (300 mg total) by mouth 3 (three) times daily as needed. ?  Multiple Vitamin (MULTIVITAMIN WITH MINERALS) TABS, Take 1 tablet by mouth daily. ?  Na Sulfate-K Sulfate-Mg Sulf (SUPREP BOWEL PREP KIT) 17.5-3.13-1.6 GM/177ML SOLN, Suprep as directed, no substitutions ?  OVER THE COUNTER MEDICATION, OTC laxative- unsure which kind- uses almost daily ? ? ?Reviewed prior external information including notes and imaging from  ?primary care provider ?As well as notes that were available from care  everywhere and other healthcare systems. ? ?Past medical history, social, surgical and family history all reviewed in electronic medical record.  No pertanent information unless stated regarding to the chief complaint.  ? ?Review of Systems: ? No headache, visual changes, nausea, vomiting, diarrhea, constipation, dizziness, abdominal pain, skin rash, fevers, chills, night sweats, weight loss, swollen lymph nodes, body aches, joint swelling, chest pain, shortness of breath, mood changes. POSITIVE muscle aches ? ?Objective  ?Blood pressure 122/88, pulse 97, height _0  (1.575 m), weight 189 lb (85.7 kg), SpO2  97 %. ?  ?General: No apparent distress alert and oriented x3 mood and affect normal, dressed appropriately.  ?HEENT: Pupils equal, extraocular movements intact  ?Respiratory: Patient's speak in full sentences and does not appear short of breath  ?Cardiovascular: 1+ lower extremity edema, non tender, no erythema  ?Gait mild antalgic gait ?Patient's right knee does have arthritic changes noted.  Trace effusion noted.  Lacks last 5 degrees of flexion.  Mild crepitus with range of motion ?Left shoulder exam does have some tenderness to palpation.  Positive impingement.  Rotator cuff strength is 4 out of 5 compared to 5 out of 5 on the contralateral side.  Good grip strength of the left hand.  Left neck mild loss of lordosis but nothing severe. ? ?Limited muscular skeletal ultrasound was performed and interpreted by Hulan Saas, M  ?Limited ultrasound of patient's left shoulder shows the patient still has some degenerative changes of the rotator cuff tear of the supraspinatus and the subscapularis.  Hypoechoic changes still noted ?Impression: Minimal change of the rotator cuff again. ?  ?Impression and Recommendations:  ?  ?The above documentation has been reviewed and is accurate and complete Lyndal Pulley, DO ? ? ? ?

## 2021-12-05 ENCOUNTER — Other Ambulatory Visit: Payer: Self-pay

## 2021-12-05 ENCOUNTER — Ambulatory Visit (INDEPENDENT_AMBULATORY_CARE_PROVIDER_SITE_OTHER): Payer: Medicare Other | Admitting: Family Medicine

## 2021-12-05 ENCOUNTER — Encounter: Payer: Self-pay | Admitting: Family Medicine

## 2021-12-05 ENCOUNTER — Ambulatory Visit: Payer: Self-pay

## 2021-12-05 VITALS — BP 122/88 | HR 97 | Ht 62.0 in | Wt 189.0 lb

## 2021-12-05 DIAGNOSIS — M170 Bilateral primary osteoarthritis of knee: Secondary | ICD-10-CM | POA: Diagnosis not present

## 2021-12-05 DIAGNOSIS — M7542 Impingement syndrome of left shoulder: Secondary | ICD-10-CM | POA: Diagnosis not present

## 2021-12-05 DIAGNOSIS — R609 Edema, unspecified: Secondary | ICD-10-CM | POA: Diagnosis not present

## 2021-12-05 DIAGNOSIS — R600 Localized edema: Secondary | ICD-10-CM | POA: Insufficient documentation

## 2021-12-05 NOTE — Patient Instructions (Signed)
Have fun in [place] ?Lets keep watching it a little longer ?See you again in 2-3 months ?

## 2021-12-05 NOTE — Assessment & Plan Note (Signed)
Patient has had the replacement patient's right side likely will need a replacement at some point.  Patient wants to hold on any type of injection at the moment.  We discussed icing regimen and home exercises.  Increase activity slowly.  Patient will be traveling.  Does have some lower extremity swelling and we discussed compression sleeves at the moment or compression socks.  Follow-up with me again in 6 to 8 weeks. ?

## 2021-12-05 NOTE — Assessment & Plan Note (Signed)
Bilateral left greater than right with some hemosiderin deposits.  Discussed starting with compression sleeve and if continuing to have difficulty follow-up with primary care provider. ?

## 2021-12-05 NOTE — Assessment & Plan Note (Signed)
Continues to have some difficulty.  Still has the tearing noted.  Discussed again about possible advanced imaging but patient does feel like she continues to make some improvement.  Underlying arthritic changes of the neck noted and has had some intermittent radicular symptoms.  Discussed icing regimen and home exercises otherwise.  Follow-up again 2 to 3 months. ?

## 2021-12-17 ENCOUNTER — Encounter: Attending: Orthopaedic Surgery

## 2021-12-21 ENCOUNTER — Ambulatory Visit: Admit: 2021-12-21 | Discharge: 2021-12-21 | Payer: MEDICARE | Attending: Orthopaedic Surgery

## 2021-12-21 DIAGNOSIS — M25562 Pain in left knee: Secondary | ICD-10-CM

## 2021-12-21 NOTE — Progress Notes (Signed)
Name: Sharon Sanders    DOB: Nov 13, 1955     [facility]  [facility]  [address]  [address]  [place] [address]  Dept: [phone]  Dept Fax: [phone]     Chief Complaint   Patient presents with    Knee Pain        Ht 5\' 2"  (1.575 m)    Wt 192 lb (87.1 kg)    BMI 35.12 kg/m??      Allergies   Allergen Reactions    Sulfa Antibiotics Rash        Current Outpatient Medications   Medication Sig Dispense Refill    ammonium lactate (LAC-HYDRIN) 12 % lotion APPLY SMALL AMOUNT TO AFFECTED AREA TWICE A DAY APPLY TO DARKENED SKIN      fluconazole (DIFLUCAN) 150 MG tablet       ibuprofen (ADVIL;MOTRIN) 600 MG tablet 600 mg      meclizine (ANTIVERT) 25 MG CHEW 25 mg      metroNIDAZOLE (FLAGYL) 500 MG tablet       aspirin 81 MG chewable tablet Take 81 mg by mouth daily      betamethasone valerate (VALISONE) 0.1 % cream betamethasone valerate 0.1 % topical cream   APPLY THIN LAYER TOPICALLY TO THE AFFECTED AREA EVERY DAY      vitamin D (CHOLECALCIFEROL) 25 MCG (1000 UT) TABS tablet Take 2,000 Units by mouth daily      Diclofenac Sodium 2 % SOLN Place onto the skin      fluticasone (FLONASE) 50 MCG/ACT nasal spray fluticasone propionate 50 mcg/actuation nasal spray,suspension   SHAKE LIQUID AND USE 1 SPRAY IN EACH NOSTRIL EVERY DAY      gabapentin (NEURONTIN) 300 MG capsule Take 300 mg by mouth.      ondansetron (ZOFRAN-ODT) 4 MG disintegrating tablet Take 4 mg by mouth every 8 hours as needed      rosuvastatin (CRESTOR) 5 MG tablet rosuvastatin 5 mg tablet   TAKE 1 TABLET BY MOUTH EVERY DAY      triamterene-hydroCHLOROthiazide (MAXZIDE-25) 37.5-25 MG per tablet triamterene 37.5 mg-hydrochlorothiazide 25 mg tablet   TAKE 1 TABLET BY MOUTH EVERY DAY IN THE MORNING       No current facility-administered medications for this visit.      Patient Active Problem List   Diagnosis    OA (osteoarthritis) of knee    S/P total knee arthroplasty, left      Family History    Problem Relation Age of Onset    No Known Problems Mother     No Known Problems Father       Social History     Socioeconomic History    Marital status: Married     Spouse name: None    Number of children: None    Years of education: None    Highest education level: None   Tobacco Use    Smoking status: Never    Smokeless tobacco: Never   Substance and Sexual Activity    Alcohol use: Yes    Drug use: Never      History reviewed. No pertinent surgical history.   Past Medical History:   Diagnosis Date    High cholesterol     Hypertension         I have reviewed and agree with PFSH and ROS and intake form in chart and the record furthermore I have reviewed prior medical record(s) regarding this patients  care during this appointment.     Review of Systems:   Patient is a pleasant appearing individual, appropriately dressed, well hydrated, well nourished, who is alert, appropriately oriented for age, and in no acute distress with a normal gait and normal affect who does not appear to be in any significant pain.     Physical Exam:  Right Knee - Full Range of Motion, No crepitation, Grossly neurovascularly intact, Good cap refill, No skin lesion, No effusion, No gross instability, No point tenderness, No quadriceps weakness, No medial or lateral joint line tenderness, Negative Lachman's, Negative McMurray's exam.     Visit Diagnoses         Codes    Left knee pain, unspecified chronicity    -  Primary M25.562               Physical examination of the left knee shows she has got a little bit of pigmentation from blood that possibly has resurfaced and is having some dark pigmentation.      HPI:  The patient is here with a chief complaint of left knee pain, throbbing burning pain.  Status post left total knee replacement which may lead to skin pigmentation.    Assessment/Plan:  Plan at this point, activities as tolerated, weightbearing started, no restrictions.  We will see her back as needed.  If she gets worse, she is to  give me a call.  No restrictions in the meantime.  I did tell her to see a dermatologist, but she has not.  We will go from there.      As part of continued conservative pain management options the patient was advised to utilize Tylenol or OTC NSAIDS as long as it is not medically contraindicated.     Return to Office:    Follow-up and Dispositions    Return if symptoms worsen or fail to improve.           Scribed by Alfred Levins, MA as dictated by [facility] A. Allena Katz, MD.  Documentation, performed by, True and Accepted Sharnita Bogucki A. Allena Katz, MD

## 2021-12-21 NOTE — Patient Instructions (Signed)
Knee Pain or Injury: Care Instructions  Overview     Injuries are a common cause of knee problems. Sudden (acute) injuries may be caused by a direct blow to the knee. They can also be caused by abnormal twisting, bending, or falling on the knee. Pain, bruising, or swelling may be severe, and may start within minutes of the injury.  Overuse is another cause of knee pain. Other causes are climbing stairs, kneeling, and other activities that use the knee. Everyday wear and tear, especially as you get older, also can cause knee pain.  Rest, along with home treatment, often relieves pain and allows your knee to heal. If you have a serious knee injury, you may need tests and treatment.  Follow-up care is a key part of your treatment and safety. Be sure to make and go to all appointments, and call your doctor if you are having problems. It's also a good idea to know your test results and keep a list of the medicines you take.  How can you care for yourself at home?  Be safe with medicines. Read and follow all instructions on the label.  If the doctor gave you a prescription medicine for pain, take it as prescribed.  If you are not taking a prescription pain medicine, ask your doctor if you can take an over-the-counter medicine.  Rest and protect your knee. Take a break from any activity that may cause pain.  Put ice or a cold pack on your knee for 10 to 20 minutes at a time. Put a thin cloth between the ice and your skin.  Prop up a sore knee on a pillow when you ice it or anytime you sit or lie down for the next 3 days. Try to keep it above the level of your heart. This will help reduce swelling.  If your knee is not swollen, you can put moist heat, a heating pad, or a warm cloth on your knee.  If your doctor recommends an elastic bandage, sleeve, or other type of support for your knee, wear it as directed.  Follow your doctor's instructions about how much weight you can put on your leg. Use a cane, crutches, or a walker  as instructed.  Follow your doctor's instructions about activity during your healing process. If you can do mild exercise, slowly increase your activity.  Stay at a healthy weight. Extra weight can strain the joints, especially the knees and hips, and make the pain worse. Losing a few pounds may help.  When should you call for help?   Call 911 anytime you think you may need emergency care. For example, call if:    You have symptoms of a blood clot in your lung (called a pulmonary embolism). These may include:  Sudden chest pain.  Trouble breathing.  Coughing up blood.   Call your doctor now or seek immediate medical care if:    You have severe or increasing pain.     Your leg or foot turns cold or changes color.     You cannot stand or put weight on your knee.     Your knee looks twisted or bent out of shape.     You cannot move your knee.     You have signs of infection, such as:  Increased pain, swelling, warmth, or redness.  Red streaks leading from the knee.  Pus draining from a place on your knee.  A fever.     You have signs of   a blood clot in your leg (called a deep vein thrombosis), such as:  Pain in your calf, back of the knee, thigh, or groin.  Redness and swelling in your leg or groin.   Watch closely for changes in your health, and be sure to contact your doctor if:    You have tingling, weakness, or numbness in your knee.     You have any new symptoms, such as swelling.     You have bruises from a knee injury that last longer than 2 weeks.     You do not get better as expected.   Where can you learn more?  Go to https://www.healthwise.net/patientEd and enter K195 to learn more about "Knee Pain or Injury: Care Instructions."  Current as of: December 06, 2020               Content Version: 13.5  ?? 2006-2022 [employer].   Care instructions adapted under license by [facility]. If you have questions about a medical condition or this instruction, always ask your healthcare professional.  [employer] disclaims any warranty or liability for your use of this information.

## 2022-02-12 NOTE — Progress Notes (Signed)
Dawn [name] [address] [place] Phone: [phone] Subjective:   [name], am serving as a scribe for Dr. Hulan Saas. This visit occurred during the SARS-CoV-2 public health emergency.  Safety protocols were in place, including screening questions prior to the visit, additional usage of staff PPE, and extensive cleaning of exam room while observing appropriate contact time as indicated for disinfecting solutions.   I'm seeing this patient by the request  of:  Kelton Pillar, MD  CC: Knee pain, shoulder pain follow-up  [id number]  12/05/2021 Continues to have some difficulty.  Still has the tearing noted.  Discussed again about possible advanced imaging but patient does feel like she continues to make some improvement.  Underlying arthritic changes of the neck noted and has had some intermittent radicular symptoms.  Discussed icing regimen and home exercises otherwise.  Follow-up again 2 to 3 months.  Patient has had the replacement patient's right side likely will need a replacement at some point.  Patient wants to hold on any type of injection at the moment.  We discussed icing regimen and home exercises.  Increase activity slowly.  Patient will be traveling.  Does have some lower extremity swelling and we discussed compression sleeves at the moment or compression socks.  Follow-up with me again in 6 to 8 weeks.  Update 02/13/2022 Dawn [name] is a 66 y.o. female coming in with complaint of L shoulder and B knee pain. Patient states feels like its feeling better. Aches sometimes. Right knee has been bothersome. Would like an injection.      Past Medical History:  Diagnosis Date   Abnormal glucose    Allergic rhinitis    Allergy    Anxiety    no per pt   Arthritis    bilateral knees- get cortisone injections   Asthma    as a child   Carpal tunnel syndrome    right   Cervical dysplasia    1996   Endometriosis     Fibroids    Hemorrhoids    High cholesterol    Hyperlipemia    Hypertension    Migraine    Neck pain    Stroke ([place]) 2014   TIA- negative work up in ED 2014   Transient cerebral ischemia    10-22-12 ED, tia s/s but negative work up in ED. see note    Past Surgical History:  Procedure Laterality Date   ABDOMINAL HYSTERECTOMY  Partial   CARPAL TUNNEL RELEASE Right    cold knife cone     for dysplasia   COLONOSCOPY     last 11-2008 w/[facility], hems only    KNEE ARTHROSCOPY WITH MEDIAL MENISECTOMY Right 03/28/2016   Procedure: RIGHT KNEE ARTHROSCOPY CHONDROPLASTY WITH MEDIAL MENISCECTOMY;  Surgeon: [name], MD;  Location: [facility];  Service: Orthopedics;  Laterality: Right;   TRIGGER FINGER RELEASE     thumb, middle finger    TUBAL LIGATION     Social History   Socioeconomic History   Marital status: Married    Spouse name: Not on file   Number of children: 1   Years of education: Not on file   Highest education level: Not on file  Occupational History   Occupation: Retired  Tobacco Use   Smoking status: Never   Smokeless tobacco: Never  Vaping Use   Vaping Use: Never used  Substance and Sexual Activity   Alcohol use: Yes  Alcohol/week: 1.0 standard drink    Types: 1 Glasses of wine per week    Comment: occasionally   Drug use: No   Sexual activity: Never  Other Topics Concern   Not on file  Social History Narrative   Not on file   Social Determinants of Health   Financial Resource Strain: Not on file  Food Insecurity: Not on file  Transportation Needs: Not on file  Physical Activity: Not on file  Stress: Not on file  Social Connections: Not on file   Allergies  Allergen Reactions   Sulfa Antibiotics Rash   Family History  Problem Relation Age of Onset   Colon cancer Father    Stomach cancer Father    Diabetes Mother    Hypertension Mother    CVA Mother    Kidney disease Mother    Aneurysm Mother    Colon cancer Maternal  Uncle    Breast cancer Maternal Aunt    Kidney disease Maternal Aunt    Heart disease Brother    Throat cancer Maternal Uncle    Rectal cancer Neg Hx    Esophageal cancer Neg Hx      Current Outpatient Medications (Cardiovascular):    rosuvastatin (CRESTOR) 5 MG tablet, Take 5 mg by mouth daily.   triamterene-hydrochlorothiazide (MAXZIDE-25) 37.5-25 MG per tablet, Take 1 tablet by mouth daily.  Current Outpatient Medications (Respiratory):    fexofenadine (ALLEGRA) 180 MG tablet, Take 180 mg by mouth daily.  Current Outpatient Medications (Analgesics):    Acetaminophen (TYLENOL PO), Take by mouth as needed.   aspirin 81 MG chewable tablet, Chew 81 mg by mouth daily.   naproxen sodium (ANAPROX) 220 MG tablet, Take 220 mg by mouth 2 (two) times daily with a meal.   Current Outpatient Medications (Other):    cholecalciferol (VITAMIN D) 1000 UNITS tablet, Take 2,000 Units by mouth daily.   cyclobenzaprine (FLEXERIL) 10 MG tablet, Take 10 mg by mouth 3 (three) times daily as needed. For headache   Diclofenac Sodium 2 % SOLN, Place onto the skin.   gabapentin (NEURONTIN) 300 MG capsule, Take 1 capsule (300 mg total) by mouth 3 (three) times daily as needed.   Multiple Vitamin (MULTIVITAMIN WITH MINERALS) TABS, Take 1 tablet by mouth daily.   Na Sulfate-K Sulfate-Mg Sulf (SUPREP BOWEL PREP KIT) 17.5-3.13-1.6 GM/177ML SOLN, Suprep as directed, no substitutions   OVER THE COUNTER MEDICATION, OTC laxative- unsure which kind- uses almost daily   Reviewed prior external information including notes and imaging from  primary care provider As well as notes that were available from care everywhere and other healthcare systems.  Past medical history, social, surgical and family history all reviewed in electronic medical record.  No pertanent information unless stated regarding to the chief complaint.   Review of Systems:  No headache, visual changes, nausea, vomiting, diarrhea, constipation,  dizziness, abdominal pain, skin rash, fevers, chills, night sweats, weight loss, swollen lymph nodes, body aches, joint swelling, chest pain, shortness of breath, mood changes. POSITIVE muscle aches  Objective  Blood pressure 126/84, pulse (!) 102, height _0  (1.575 m), weight 192 lb (87.1 kg), SpO2 96 %.   General: No apparent distress alert and oriented x3 mood and affect normal, dressed appropriately.  HEENT: Pupils equal, extraocular movements intact  Respiratory: Patient's speak in full sentences and does not appear short of breath  Cardiovascular: No lower extremity edema, non tender, no erythema  Gait  antalgic  Patient does have effusion noted of the  right knee.  Instability with valgus and varus force.  Lacks last 2 degrees of extension in the last 5 degrees of flexion Left shoulder exam does have good range of motion but positive impingement still noted.  5-5 strength of the rotator cuff.  Limited muscular skeletal ultrasound was performed and interpreted by Hulan Saas, M  Limited ultrasound showed the patient does have some mild degenerative changes of the rotator cuff still noted.  Mild hypoechoic changes still consistent with a subacromial bursitis. Impression: Continued rotator cuff tendinitis and bursitis  After informed written and verbal consent, patient was seated on exam table. Right knee was prepped with alcohol swab and utilizing anterolateral approach, patient's right knee space was injected with 4:1  marcaine 0.5%: Kenalog 53m/dL. Patient tolerated the procedure well without immediate complications.    Impression and Recommendations:     The above documentation has been reviewed and is accurate and complete ZLyndal Pulley DO

## 2022-02-18 ENCOUNTER — Ambulatory Visit (INDEPENDENT_AMBULATORY_CARE_PROVIDER_SITE_OTHER): Payer: Medicare Other | Admitting: Family Medicine

## 2022-02-18 ENCOUNTER — Ambulatory Visit: Payer: Self-pay

## 2022-02-18 VITALS — BP 126/84 | HR 102 | Ht 62.0 in | Wt 192.0 lb

## 2022-02-18 DIAGNOSIS — M7542 Impingement syndrome of left shoulder: Secondary | ICD-10-CM

## 2022-02-18 DIAGNOSIS — M170 Bilateral primary osteoarthritis of knee: Secondary | ICD-10-CM

## 2022-02-18 DIAGNOSIS — M1711 Unilateral primary osteoarthritis, right knee: Secondary | ICD-10-CM

## 2022-02-18 NOTE — Assessment & Plan Note (Signed)
Still has some impingement noted with some rotator cuff pathology.  We will discuss again about the possibility of advanced imaging.  Does seem to be improving and wants to get it more.

## 2022-02-18 NOTE — Patient Instructions (Signed)
Good to see you!  Keep watching the shoulder Injection in Knee today Have fun at the graduations See you again in 2 months

## 2022-02-18 NOTE — Assessment & Plan Note (Signed)
Chronic problem with exacerbation.  Patient responded well to the injections previously and hopefully well as well.  We will get viscosupplementation approved if necessary.  Likely will need replacement of this knee as well in the future.  Patient is doing well with the contralateral knee.

## 2022-03-06 ENCOUNTER — Ambulatory Visit: Payer: Medicare Other | Admitting: Family Medicine

## 2022-04-15 NOTE — Progress Notes (Signed)
Dawn [name] [address] [place] Phone: [phone] Subjective:   Dawn [name], am serving as a scribe for Dr. Hulan Saas.  I'm seeing this patient by the request  of:  Dawn Pillar, MD  CC: Left shoulder and bilateral knee pain  [id number]  02/18/2022 Still has some impingement noted with some rotator cuff pathology.  We will discuss again about the possibility of advanced imaging.  Does seem to be improving and wants to get it more.  Chronic problem with exacerbation.  Patient responded well to the injections previously and hopefully well as well.  We will get viscosupplementation approved if necessary.  Likely will need replacement of this knee as well in the future.  Patient is doing well with the contralateral knee  Update 04/16/2022 Dawn [name] is a 66 y.o. female coming in with complaint of L shoulder and B knee pain. Patient states shoulder is feeling better. Aches sometimes. No pain at the moment. In knees sometimes it aches and good times as well. Has been taking arthritis tylenol.     Past Medical History:  Diagnosis Date   Abnormal glucose    Allergic rhinitis    Allergy    Anxiety    no per pt   Arthritis    bilateral knees- get cortisone injections   Asthma    as a child   Carpal tunnel syndrome    right   Cervical dysplasia    1996   Endometriosis    Fibroids    Hemorrhoids    High cholesterol    Hyperlipemia    Hypertension    Migraine    Neck pain    Stroke ([place]) 2014   TIA- negative work up in ED 2014   Transient cerebral ischemia    10-22-12 ED, tia s/s but negative work up in ED. see note    Past Surgical History:  Procedure Laterality Date   ABDOMINAL HYSTERECTOMY  Partial   CARPAL TUNNEL RELEASE Right    cold knife cone     for dysplasia   COLONOSCOPY     last 11-2008 w/pat[facility], hems only    KNEE ARTHROSCOPY WITH MEDIAL MENISECTOMY Right 03/28/2016   Procedure: RIGHT KNEE  ARTHROSCOPY CHONDROPLASTY WITH MEDIAL MENISCECTOMY;  Surgeon: [name], MD;  Location: [facility];  Service: Orthopedics;  Laterality: Right;   TRIGGER FINGER RELEASE     thumb, middle finger    TUBAL LIGATION     Social History   Socioeconomic History   Marital status: Married    Spouse name: Not on file   Number of children: 1   Years of education: Not on file   Highest education level: Not on file  Occupational History   Occupation: Retired  Tobacco Use   Smoking status: Never   Smokeless tobacco: Never  Vaping Use   Vaping Use: Never used  Substance and Sexual Activity   Alcohol use: Yes    Alcohol/week: 1.0 standard drink of alcohol    Types: 1 Glasses of wine per week    Comment: occasionally   Drug use: No   Sexual activity: Never  Other Topics Concern   Not on file  Social History Narrative   Not on file   Social Determinants of Health   Financial Resource Strain: Not on file  Food Insecurity: Not on file  Transportation Needs: Not on file  Physical Activity: Not on file  Stress: Not on file  Social Connections: Not on file   Allergies  Allergen Reactions   Sulfa Antibiotics Rash   Family History  Problem Relation Age of Onset   Colon cancer Father    Stomach cancer Father    Diabetes Mother    Hypertension Mother    CVA Mother    Kidney disease Mother    Aneurysm Mother    Colon cancer Maternal Uncle    Breast cancer Maternal Aunt    Kidney disease Maternal Aunt    Heart disease Brother    Throat cancer Maternal Uncle    Rectal cancer Neg Hx    Esophageal cancer Neg Hx      Current Outpatient Medications (Cardiovascular):    rosuvastatin (CRESTOR) 5 MG tablet, Take 5 mg by mouth daily.   triamterene-hydrochlorothiazide (MAXZIDE-25) 37.5-25 MG per tablet, Take 1 tablet by mouth daily.  Current Outpatient Medications (Respiratory):    fexofenadine (ALLEGRA) 180 MG tablet, Take 180 mg by mouth daily.  Current  Outpatient Medications (Analgesics):    Acetaminophen (TYLENOL PO), Take by mouth as needed.   aspirin 81 MG chewable tablet, Chew 81 mg by mouth daily.   naproxen sodium (ANAPROX) 220 MG tablet, Take 220 mg by mouth 2 (two) times daily with a meal.   Current Outpatient Medications (Other):    cholecalciferol (VITAMIN D) 1000 UNITS tablet, Take 2,000 Units by mouth daily.   cyclobenzaprine (FLEXERIL) 10 MG tablet, Take 10 mg by mouth 3 (three) times daily as needed. For headache   Diclofenac Sodium 2 % SOLN, Place onto the skin.   gabapentin (NEURONTIN) 300 MG capsule, Take 1 capsule (300 mg total) by mouth 3 (three) times daily as needed.   Multiple Vitamin (MULTIVITAMIN WITH MINERALS) TABS, Take 1 tablet by mouth daily.   Na Sulfate-K Sulfate-Mg Sulf (SUPREP BOWEL PREP KIT) 17.5-3.13-1.6 GM/177ML SOLN, Suprep as directed, no substitutions   OVER THE COUNTER MEDICATION, OTC laxative- unsure which kind- uses almost daily   Reviewed prior external information including notes and imaging from  primary care provider As well as notes that were available from care everywhere and other healthcare systems.  Past medical history, social, surgical and family history all reviewed in electronic medical record.  No pertanent information unless stated regarding to the chief complaint.   Review of Systems:  No headache, visual changes, nausea, vomiting, diarrhea, constipation, dizziness, abdominal pain, skin rash, fevers, chills, night sweats, weight loss, swollen lymph nodes, body aches, joint swelling, chest pain, shortness of breath, mood changes. POSITIVE muscle aches  Objective  Blood pressure (!) 128/96, pulse 94, height 5' 2" (1.575 m), weight 197 lb (89.4 kg), SpO2 95 %.   General: No apparent distress alert and oriented x3 mood and affect normal, dressed appropriately.  HEENT: Pupils equal, extraocular movements intact  Respiratory: Patient's speak in full sentences and does not appear short  of breath  Cardiovascular: No lower extremity edema, non tender, no erythema  Left shoulder exam does have a positive O'Brien's.  Weakness with 3 out of 5 strength of the rotator cuff.  This is worse than previous exam and contralateral side.  Patient does have a positive crossover noted as well. Left knee does have a replacement mildly tender but no significant swelling. Right knee does have a trace effusion noted and continues to have instability noted.    Impression and Recommendations:    The above documentation has been reviewed and is accurate and complete Lyndal Pulley, DO

## 2022-04-16 ENCOUNTER — Ambulatory Visit: Payer: Medicare Other

## 2022-04-16 ENCOUNTER — Ambulatory Visit (INDEPENDENT_AMBULATORY_CARE_PROVIDER_SITE_OTHER): Payer: Medicare Other | Admitting: Family Medicine

## 2022-04-16 VITALS — BP 128/96 | HR 94 | Ht 62.0 in | Wt 197.0 lb

## 2022-04-16 DIAGNOSIS — M7542 Impingement syndrome of left shoulder: Secondary | ICD-10-CM

## 2022-04-16 DIAGNOSIS — M170 Bilateral primary osteoarthritis of knee: Secondary | ICD-10-CM | POA: Diagnosis not present

## 2022-04-16 NOTE — Patient Instructions (Addendum)
[place] [phone] Call Today  When we receive your results we will contact you.  Read on PRP just in case See you again in 6-8 weeks for knees

## 2022-04-16 NOTE — Assessment & Plan Note (Signed)
We will continue to monitor the right knee at the moment.  Discussed icing regimen and home exercises.  Patient wants to hold on injection at this moment.  Follow-up again in 6 to 8 weeks

## 2022-04-16 NOTE — Assessment & Plan Note (Signed)
Patient unfortunately continues to have some discomfort and pain.  He did significantly somewhat rotated pathology on ultrasound today does appear that patient may need to have partial tearing noted.  Mild retraction of the supraspinatus noted as well.  Due to the longevity of the patient failing all conservative therapy including injections, formal physical therapy, home exercises consider MR arthrogram to further evaluate for labral pathology as well as the further evaluation of the rotator cuff depending on findings we will see if patient needs surgical intervention or would be a candidate for possible PRP.  Follow-up with me after imaging to discuss further

## 2022-04-26 ENCOUNTER — Ambulatory Visit
Admission: RE | Admit: 2022-04-26 | Discharge: 2022-04-26 | Disposition: A | Discharge status: Home / Self Care | Payer: Medicare Other | Source: Ambulatory Visit | Attending: Family Medicine | Admitting: Family Medicine

## 2022-04-26 DIAGNOSIS — M7542 Impingement syndrome of left shoulder: Secondary | ICD-10-CM

## 2022-04-26 NOTE — Progress Notes (Unsigned)
Dawn [name] [address] [place] Phone: [phone] Subjective:   Dawn [name], am serving as a scribe for Dr. Hulan Saas.  I'm seeing this patient by the request  of:  Kelton Pillar, MD  CC: left shoulder and bilateral knee pain   [id number]  04/16/2022 We will continue to monitor the right knee at the moment.  Discussed icing regimen and home exercises.  Patient wants to hold on injection at this moment.  Follow-up again in 6 to 8 weeks  Patient unfortunately continues to have some discomfort and pain.  He did significantly somewhat rotated pathology on ultrasound today does appear that patient may need to have partial tearing noted.  Mild retraction of the supraspinatus noted as well.  Due to the longevity of the patient failing all conservative therapy including injections, formal physical therapy, home exercises consider MR arthrogram to further evaluate for labral pathology as well as the further evaluation of the rotator cuff depending on findings we will see if patient needs surgical intervention or would be a candidate for possible PRP.  Follow-up with me after imaging to discuss further  Update 04/29/2022 Dawn [name] is a 66 y.o. female coming in with complaint of L shoulder and B knee pain. Patient states does want injection in right knee. Wants to go over MRI and options.  MRI IMPRESSION: 1. Moderate partial-thickness tears of portions of the mid and anterior supraspinatus tendon critical zone and tendon footprint in a region measuring up to 2.1 cm in AP dimension. 2. Mild anterior infraspinatus tendinosis. Minimal superior subscapularis tendinosis. 3. Mild-to-moderate supraspinatus muscle atrophy. 4. Partial-thickness tearing of the articular side of the long head of the biceps tendon as it courses over the lesser tuberosity into the bicipital groove. 5. Mild downsloping of the anterolateral acromion.      Past Medical History:  Diagnosis Date   Abnormal glucose    Allergic rhinitis    Allergy    Anxiety    no per pt   Arthritis    bilateral knees- get cortisone injections   Asthma    as a child   Carpal tunnel syndrome    right   Cervical dysplasia    1996   Endometriosis    Fibroids    Hemorrhoids    High cholesterol    Hyperlipemia    Hypertension    Migraine    Neck pain    Stroke ([place]) 2014   TIA- negative work up in ED 2014   Transient cerebral ischemia    10-22-12 ED, tia s/s but negative work up in ED. see note    Past Surgical History:  Procedure Laterality Date   ABDOMINAL HYSTERECTOMY  Partial   CARPAL TUNNEL RELEASE Right    cold knife cone     for dysplasia   COLONOSCOPY     last 11-2008 w/[facility], hems only    KNEE ARTHROSCOPY WITH MEDIAL MENISECTOMY Right 03/28/2016   Procedure: RIGHT KNEE ARTHROSCOPY CHONDROPLASTY WITH MEDIAL MENISCECTOMY;  Surgeon: [name], MD;  Location: [facility];  Service: Orthopedics;  Laterality: Right;   TRIGGER FINGER RELEASE     thumb, middle finger    TUBAL LIGATION     Social History   Socioeconomic History   Marital status: Married    Spouse name: Not on file   Number of children: 1   Years of education: Not on file   Highest education level: Not on file  Occupational History   Occupation: Retired  Tobacco Use   Smoking status: Never   Smokeless tobacco: Never  Vaping Use   Vaping Use: Never used  Substance and Sexual Activity   Alcohol use: Yes    Alcohol/week: 1.0 standard drink of alcohol    Types: 1 Glasses of wine per week    Comment: occasionally   Drug use: No   Sexual activity: Never  Other Topics Concern   Not on file  Social History Narrative   Not on file   Social Determinants of Health   Financial Resource Strain: Not on file  Food Insecurity: Not on file  Transportation Needs: Not on file  Physical Activity: Not on file  Stress: Not on file  Social  Connections: Not on file   Allergies  Allergen Reactions   Sulfa Antibiotics Rash   Family History  Problem Relation Age of Onset   Colon cancer Father    Stomach cancer Father    Diabetes Mother    Hypertension Mother    CVA Mother    Kidney disease Mother    Aneurysm Mother    Colon cancer Maternal Uncle    Breast cancer Maternal Aunt    Kidney disease Maternal Aunt    Heart disease Brother    Throat cancer Maternal Uncle    Rectal cancer Neg Hx    Esophageal cancer Neg Hx      Current Outpatient Medications (Cardiovascular):    rosuvastatin (CRESTOR) 5 MG tablet, Take 5 mg by mouth daily.   triamterene-hydrochlorothiazide (MAXZIDE-25) 37.5-25 MG per tablet, Take 1 tablet by mouth daily.  Current Outpatient Medications (Respiratory):    fexofenadine (ALLEGRA) 180 MG tablet, Take 180 mg by mouth daily.  Current Outpatient Medications (Analgesics):    Acetaminophen (TYLENOL PO), Take by mouth as needed.   aspirin 81 MG chewable tablet, Chew 81 mg by mouth daily.   naproxen sodium (ANAPROX) 220 MG tablet, Take 220 mg by mouth 2 (two) times daily with a meal.   Current Outpatient Medications (Other):    cholecalciferol (VITAMIN D) 1000 UNITS tablet, Take 2,000 Units by mouth daily.   cyclobenzaprine (FLEXERIL) 10 MG tablet, Take 10 mg by mouth 3 (three) times daily as needed. For headache   Diclofenac Sodium 2 % SOLN, Place onto the skin.   gabapentin (NEURONTIN) 300 MG capsule, Take 1 capsule (300 mg total) by mouth 3 (three) times daily as needed.   Multiple Vitamin (MULTIVITAMIN WITH MINERALS) TABS, Take 1 tablet by mouth daily.   Na Sulfate-K Sulfate-Mg Sulf (SUPREP BOWEL PREP KIT) 17.5-3.13-1.6 GM/177ML SOLN, Suprep as directed, no substitutions   OVER THE COUNTER MEDICATION, OTC laxative- unsure which kind- uses almost daily   Reviewed prior external information including notes and imaging from  primary care provider As well as notes that were available from care  everywhere and other healthcare systems.  Past medical history, social, surgical and family history all reviewed in electronic medical record.  No pertanent information unless stated regarding to the chief complaint.   Review of Systems:  No headache, visual changes, nausea, vomiting, diarrhea, constipation, dizziness, abdominal pain, skin rash, fevers, chills, night sweats, weight loss, swollen lymph nodes, body aches, joint swelling, chest pain, shortness of breath, mood changes. POSITIVE muscle aches  Objective  Blood pressure 122/84, pulse 89, height $RemoveBe'5\' 2"'CkwHlInOM$  (1.575 m), weight 192 lb (87.1 kg), SpO2 96 %.   General: No apparent distress alert and oriented x3 mood and affect normal, dressed appropriately.  HEENT: Pupils equal, extraocular movements intact  Respiratory: Patient's speak in full sentences and does not appear short of breath  Cardiovascular: No lower extremity edema, non tender, no erythema  Left shoulder still has positive impingement noted.  Rotator cuff strength is 4 out of 5.   Right knee exam does have some instability noted.  Trace effusion noted. After informed written and verbal consent, patient was seated on exam table. Right knee was prepped with alcohol swab and utilizing anterolateral approach, patient's right knee space was injected with 4:1  marcaine 0.5%: Kenalog $RemoveBefor'40mg'hBkHbGStmFXX$ /dL. Patient tolerated the procedure well without immediate complications.    Impression and Recommendations:    The above documentation has been reviewed and is accurate and complete Lyndal Pulley, DO

## 2022-04-29 ENCOUNTER — Ambulatory Visit (INDEPENDENT_AMBULATORY_CARE_PROVIDER_SITE_OTHER): Payer: Medicare Other | Admitting: Family Medicine

## 2022-04-29 VITALS — BP 122/84 | HR 89 | Ht 62.0 in | Wt 192.0 lb

## 2022-04-29 DIAGNOSIS — M7542 Impingement syndrome of left shoulder: Secondary | ICD-10-CM | POA: Diagnosis not present

## 2022-04-29 DIAGNOSIS — M170 Bilateral primary osteoarthritis of knee: Secondary | ICD-10-CM | POA: Diagnosis not present

## 2022-04-29 DIAGNOSIS — M25512 Pain in left shoulder: Secondary | ICD-10-CM

## 2022-04-29 NOTE — Assessment & Plan Note (Signed)
Patient has had impingement for some time.  Patient does have the MRI now showing a partial tear noted of the rotator cuff.  I would consider high-grade tear of the supraspinatus anteriorly.  Mild muscle atrophy noted.  Due to these findings in the type II downsloping acromion I do think that there is a possibility of surgical intervention being necessary.  We will send to orthopedic surgery.  Has failed multiple injections, home exercises, oral exercises and physical therapy.

## 2022-04-29 NOTE — Assessment & Plan Note (Signed)
Chronic problem with worsening symptoms.  Does have the underlying arthritic changes.  Can consider the possibility of viscosupplementation again if necessary.  Has responded to injections previously and hopefully I will be better.  Follow-up with me again in 6 to 8 weeks otherwise.

## 2022-04-29 NOTE — Patient Instructions (Addendum)
Good to see you! Injection in right knee Referral to Ortho: Dr. Tamera Punt  See you again in 2-3 months

## 2022-05-28 ENCOUNTER — Ambulatory Visit: Payer: Medicare Other | Admitting: Family Medicine

## 2022-07-03 ENCOUNTER — Ambulatory Visit: Payer: Medicare Other | Admitting: Family Medicine

## 2022-07-23 ENCOUNTER — Ambulatory Visit: Payer: Medicare Other | Admitting: Family Medicine

## 2022-08-21 NOTE — Progress Notes (Signed)
[place] Phone: [phone] Subjective:   Dawn [name], am serving as a scribe for Dr. Hulan Saas.  I'm seeing this patient by the request  of:  Kelton Pillar, MD  CC: Right knee pain  [id number]  04/29/2022 Chronic problem with worsening symptoms.  Does have the underlying arthritic changes.  Can consider the possibility of viscosupplementation again if necessary.  Has responded to injections previously and hopefully I will be better.  Follow-up with me again in 6 to 8 weeks otherwise.     Patient has had impingement for some time.  Patient does have the MRI now showing a partial tear noted of the rotator cuff.  I would consider high-grade tear of the supraspinatus anteriorly.  Mild muscle atrophy noted.  Due to these findings in the type II downsloping acromion I do think that there is a possibility of surgical intervention being necessary.  We will send to orthopedic surgery.  Has failed multiple injections, home exercises, oral exercises and physical therapy.      Update 08/28/2022 Dawn [name] is a 67 y.o. female coming in with complaint of L shoulder and B knee pain. Patient states that R knee pain has increased over past few weeks. Injection does help.   Hx of TKR on L and would like xray to f/u to make joint is doing ok. Wants to know if discoloration in lower leg will eventually go away.   Is going to try conservative therapy for shoulder at this time as she does not have        Past Medical History:  Diagnosis Date   Abnormal glucose    Allergic rhinitis    Allergy    Anxiety    [name] per pt   Arthritis    bilateral knees- get cortisone injections   Asthma    as a child   Carpal tunnel syndrome    right   Cervical dysplasia    1996   Endometriosis    Fibroids    Hemorrhoids    High cholesterol    Hyperlipemia    Hypertension    Migraine    Neck pain    Stroke ([place]) 2014    TIA- negative work up in ED 2014   Transient cerebral ischemia    10-22-12 ED, tia s/s but negative work up in ED. see note    Past Surgical History:  Procedure Laterality Date   ABDOMINAL HYSTERECTOMY  Partial   CARPAL TUNNEL RELEASE Right    cold knife cone     for dysplasia   COLONOSCOPY     last 11-2008 w/[facility], hems only    KNEE ARTHROSCOPY WITH MEDIAL MENISECTOMY Right 03/28/2016   Procedure: RIGHT KNEE ARTHROSCOPY CHONDROPLASTY WITH MEDIAL MENISCECTOMY;  Surgeon: [name], MD;  Location: [facility];  Service: Orthopedics;  Laterality: Right;   TRIGGER FINGER RELEASE     thumb, middle finger    TUBAL LIGATION     Social History   Socioeconomic History   Marital status: Married    Spouse name: Not on file   Number of children: 1   Years of education: Not on file   Highest education level: Not on file  Occupational History   Occupation: Retired  Tobacco Use   Smoking status: Never   Smokeless tobacco: Never  Vaping Use   Vaping Use: Never used  Substance and Sexual Activity   Alcohol use: Yes  Alcohol/week: 1.0 standard drink of alcohol    Types: 1 Glasses of wine per week    Comment: occasionally   Drug use: [name]   Sexual activity: Never  Other Topics Concern   Not on file  Social History Narrative   Not on file   Social Determinants of Health   Financial Resource Strain: Not on file  Food Insecurity: Not on file  Transportation Needs: Not on file  Physical Activity: Not on file  Stress: Not on file  Social Connections: Not on file   Allergies  Allergen Reactions   Sulfa Antibiotics Rash   Family History  Problem Relation Age of Onset   Colon cancer Father    Stomach cancer Father    Diabetes Mother    Hypertension Mother    CVA Mother    Kidney disease Mother    Aneurysm Mother    Colon cancer Maternal Uncle    Breast cancer Maternal Aunt    Kidney disease Maternal Aunt    Heart disease Brother    Throat cancer  Maternal Uncle    Rectal cancer Neg Hx    Esophageal cancer Neg Hx      Current Outpatient Medications (Cardiovascular):    rosuvastatin (CRESTOR) 5 MG tablet, Take 5 mg by mouth daily.   triamterene-hydrochlorothiazide (MAXZIDE-25) 37.5-25 MG per tablet, Take 1 tablet by mouth daily.  Current Outpatient Medications (Respiratory):    fexofenadine (ALLEGRA) 180 MG tablet, Take 180 mg by mouth daily.  Current Outpatient Medications (Analgesics):    Acetaminophen (TYLENOL PO), Take by mouth as needed.   aspirin 81 MG chewable tablet, Chew 81 mg by mouth daily.   naproxen sodium (ANAPROX) 220 MG tablet, Take 220 mg by mouth 2 (two) times daily with a meal.   Current Outpatient Medications (Other):    cholecalciferol (VITAMIN D) 1000 UNITS tablet, Take 2,000 Units by mouth daily.   cyclobenzaprine (FLEXERIL) 10 MG tablet, Take 10 mg by mouth 3 (three) times daily as needed. For headache   Diclofenac Sodium 2 % SOLN, Place onto the skin.   gabapentin (NEURONTIN) 300 MG capsule, Take 1 capsule (300 mg total) by mouth 3 (three) times daily as needed.   Multiple Vitamin (MULTIVITAMIN WITH MINERALS) TABS, Take 1 tablet by mouth daily.   Na Sulfate-K Sulfate-Mg Sulf (SUPREP BOWEL PREP KIT) 17.5-3.13-1.6 GM/177ML SOLN, Suprep as directed, [name] substitutions   OVER THE COUNTER MEDICATION, OTC laxative- unsure which kind- uses almost daily   Reviewed prior external information including notes and imaging from  primary care provider As well as notes that were available from care everywhere and other healthcare systems.  Past medical history, social, surgical and family history all reviewed in electronic medical record.  [name] pertanent information unless stated regarding to the chief complaint.   Review of Systems:  [name] headache, visual changes, nausea, vomiting, diarrhea, constipation, dizziness, abdominal pain, skin rash, fevers, chills, night sweats, weight loss, swollen lymph nodes, body aches,  joint swelling, chest pain, shortness of breath, mood changes. POSITIVE muscle aches  Objective  Blood pressure 112/82, pulse (!) 105, height _0  (1.575 m), SpO2 97 %.   General: [name] apparent distress alert and oriented x3 mood and affect normal, dressed appropriately.  HEENT: Pupils equal, extraocular movements intact  Respiratory: Patient's speak in full sentences and does not appear short of breath  Cardiovascular: [name] lower extremity edema, non tender, [name] erythema  Right knee exam shows the patient does have some trace effusion noted.  Lacks  last 10 degrees of flexion.  Mild instability with valgus and varus force.  Abnormal thigh to calf ratio.  Left knee shows good healing.  Very mild scar tissue formation noted.  Has 95 degrees of flexion.  Nontender.  After informed written and verbal consent, patient was seated on exam table. Right knee was prepped with alcohol swab and utilizing anterolateral approach, patient's right knee space was injected with 4:1  marcaine 0.5%: Kenalog 59m/dL. Patient tolerated the procedure well without immediate complications.    Impression and Recommendations:     The above documentation has been reviewed and is accurate and complete Dawn Pulley DO

## 2022-08-28 ENCOUNTER — Ambulatory Visit: Payer: Self-pay

## 2022-08-28 ENCOUNTER — Ambulatory Visit (INDEPENDENT_AMBULATORY_CARE_PROVIDER_SITE_OTHER): Payer: Medicare Other

## 2022-08-28 ENCOUNTER — Ambulatory Visit (INDEPENDENT_AMBULATORY_CARE_PROVIDER_SITE_OTHER): Payer: Medicare Other | Admitting: Family Medicine

## 2022-08-28 ENCOUNTER — Encounter: Payer: Self-pay | Admitting: Family Medicine

## 2022-08-28 VITALS — BP 112/82 | HR 105 | Ht 62.0 in

## 2022-08-28 DIAGNOSIS — M1711 Unilateral primary osteoarthritis, right knee: Secondary | ICD-10-CM

## 2022-08-28 DIAGNOSIS — M25512 Pain in left shoulder: Secondary | ICD-10-CM | POA: Diagnosis not present

## 2022-08-28 DIAGNOSIS — M170 Bilateral primary osteoarthritis of knee: Secondary | ICD-10-CM | POA: Diagnosis not present

## 2022-08-28 DIAGNOSIS — M25562 Pain in left knee: Secondary | ICD-10-CM | POA: Diagnosis not present

## 2022-08-28 DIAGNOSIS — Z96652 Presence of left artificial knee joint: Secondary | ICD-10-CM | POA: Diagnosis not present

## 2022-08-28 DIAGNOSIS — G8929 Other chronic pain: Secondary | ICD-10-CM | POA: Diagnosis not present

## 2022-08-28 NOTE — Assessment & Plan Note (Signed)
Right knee injected today, tolerated the procedure well, discussed icing regimen and home exercises.  Discussed which activities to do and which ones to avoid.  Increase activity slowly.  Follow-up again in 6 to 8 weeks.  Chronic problem with worsening symptoms.  Still wants to avoid any surgical intervention.

## 2022-08-28 NOTE — Patient Instructions (Addendum)
Injected R knee L knee xray today See me in 3 months

## 2022-08-28 NOTE — Assessment & Plan Note (Signed)
Left knee replacement noted.  Will get x-rays to do so patient can make sure that it is appropriate for patient's surgeon to further evaluate.  She is doing significantly well with that at this moment.

## 2022-09-27 ENCOUNTER — Telehealth: Payer: Self-pay | Admitting: Family Medicine

## 2022-09-27 NOTE — Telephone Encounter (Signed)
Patient called stating that she had an injection in her right knee on 08/28/22 but she is still having pain. He gave the injection on the right side but it is the left side that is hurting on her right knee. She asked if she would be able to get another injection or what Dr Tamala Julian would suggest?

## 2022-09-27 NOTE — Telephone Encounter (Signed)
Sent patient MyChart message.

## 2022-10-24 NOTE — Progress Notes (Signed)
[place] Phone: [phone] Subjective:   [name], am serving as a scribe for Dr. Hulan Saas.  I'm seeing this patient by the request  of:  Kelton Pillar, MD  CC: Right knee pain  ERX:[id number]  08/28/2022 Left knee replacement noted. Will get x-rays to do so patient can make sure that it is appropriate for patient's surgeon to further evaluate. She is doing significantly well with that at this moment   Right knee injected today, tolerated the procedure well, discussed icing regimen and home exercises.  Discussed which activities to do and which ones to avoid.  Increase activity slowly.  Follow-up again in 6 to 8 weeks.  Chronic problem with worsening symptoms.  Still wants to avoid any surgical intervention     Updated 10/28/2022 SAKIA SCHRIMPF is a 67 y.o. female coming in with complaint of bilateral knee pain. Patient said that she did not get any relief from injection. Pain over medial aspect.   L knee is doing well. Trying to lose some weight and would like recommendation for provider to help her.        Past Medical History:  Diagnosis Date   Abnormal glucose    Allergic rhinitis    Allergy    Anxiety    no per pt   Arthritis    bilateral knees- get cortisone injections   Asthma    as a child   Carpal tunnel syndrome    right   Cervical dysplasia    1996   Endometriosis    Fibroids    Hemorrhoids    High cholesterol    Hyperlipemia    Hypertension    Migraine    Neck pain    Stroke ([place]) 2014   TIA- negative work up in ED 2014   Transient cerebral ischemia    10-22-12 ED, tia s/s but negative work up in ED. see note    Past Surgical History:  Procedure Laterality Date   ABDOMINAL HYSTERECTOMY  Partial   CARPAL TUNNEL RELEASE Right    cold knife cone     for dysplasia   COLONOSCOPY     last 11-2008 w/patterson, hems only    KNEE ARTHROSCOPY WITH MEDIAL MENISECTOMY  Right 03/28/2016   Procedure: RIGHT KNEE ARTHROSCOPY CHONDROPLASTY WITH MEDIAL MENISCECTOMY;  Surgeon: [name], MD;  Location: [facility];  Service: Orthopedics;  Laterality: Right;   TRIGGER FINGER RELEASE     thumb, middle finger    TUBAL LIGATION     Social History   Socioeconomic History   Marital status: Married    Spouse name: Not on file   Number of children: 1   Years of education: Not on file   Highest education level: Not on file  Occupational History   Occupation: Retired  Tobacco Use   Smoking status: Never   Smokeless tobacco: Never  Vaping Use   Vaping Use: Never used  Substance and Sexual Activity   Alcohol use: Yes    Alcohol/week: 1.0 standard drink of alcohol    Types: 1 Glasses of wine per week    Comment: occasionally   Drug use: No   Sexual activity: Never  Other Topics Concern   Not on file  Social History Narrative   Not on file   Social Determinants of Health   Financial Resource Strain: Not on file  Food Insecurity: Not on file  Transportation Needs:  Not on file  Physical Activity: Not on file  Stress: Not on file  Social Connections: Not on file   Allergies  Allergen Reactions   Sulfa Antibiotics Rash   Family History  Problem Relation Age of Onset   Colon cancer Father    Stomach cancer Father    Diabetes Mother    Hypertension Mother    CVA Mother    Kidney disease Mother    Aneurysm Mother    Colon cancer Maternal Uncle    Breast cancer Maternal Aunt    Kidney disease Maternal Aunt    Heart disease Brother    Throat cancer Maternal Uncle    Rectal cancer Neg Hx    Esophageal cancer Neg Hx      Current Outpatient Medications (Cardiovascular):    rosuvastatin (CRESTOR) 5 MG tablet, Take 5 mg by mouth daily.   triamterene-hydrochlorothiazide (MAXZIDE-25) 37.5-25 MG per tablet, Take 1 tablet by mouth daily.  Current Outpatient Medications (Respiratory):    fexofenadine (ALLEGRA) 180 MG tablet, Take  180 mg by mouth daily.  Current Outpatient Medications (Analgesics):    Acetaminophen (TYLENOL PO), Take by mouth as needed.   aspirin 81 MG chewable tablet, Chew 81 mg by mouth daily.   naproxen sodium (ANAPROX) 220 MG tablet, Take 220 mg by mouth 2 (two) times daily with a meal.   Current Outpatient Medications (Other):    cholecalciferol (VITAMIN D) 1000 UNITS tablet, Take 2,000 Units by mouth daily.   cyclobenzaprine (FLEXERIL) 10 MG tablet, Take 10 mg by mouth 3 (three) times daily as needed. For headache   Diclofenac Sodium 2 % SOLN, Place onto the skin.   gabapentin (NEURONTIN) 300 MG capsule, Take 1 capsule (300 mg total) by mouth 3 (three) times daily as needed.   Multiple Vitamin (MULTIVITAMIN WITH MINERALS) TABS, Take 1 tablet by mouth daily.   Na Sulfate-K Sulfate-Mg Sulf (SUPREP BOWEL PREP KIT) 17.5-3.13-1.6 GM/177ML SOLN, Suprep as directed, no substitutions   OVER THE COUNTER MEDICATION, OTC laxative- unsure which kind- uses almost daily   Reviewed prior external information including notes and imaging from  primary care provider As well as notes that were available from care everywhere and other healthcare systems.  Past medical history, social, surgical and family history all reviewed in electronic medical record.  No pertanent information unless stated regarding to the chief complaint.   Review of Systems:  No headache, visual changes, nausea, vomiting, diarrhea, constipation, dizziness, abdominal pain, skin rash, fevers, chills, night sweats, weight loss, swollen lymph nodes, body aches, joint swelling, chest pain, shortness of breath, mood changes. POSITIVE muscle aches  Objective  Blood pressure 118/84, pulse 91, height '5\' 2"'$  (1.575 m), weight 194 lb (88 kg), SpO2 96 %.   General: No apparent distress alert and oriented x3 mood and affect normal, dressed appropriately.  HEENT: Pupils equal, extraocular movements intact  Respiratory: Patient's speak in full  sentences and does not appear short of breath  Cardiovascular: No lower extremity edema, non tender, no erythema  Antalgic gait noted.  Right knee does have some swelling noted.  Crepitus noted.  Instability with valgus and varus force.  After informed written and verbal consent, patient was seated on exam table. Right knee was prepped with alcohol swab and utilizing anterolateral approach, patient's right knee space was injected with 4:1  marcaine 0.5%: Kenalog '40mg'$ /dL. Patient tolerated the procedure well without immediate complications.    Impression and Recommendations:     The above documentation has been reviewed  and is accurate and complete Lyndal Pulley, DO

## 2022-10-28 ENCOUNTER — Ambulatory Visit (INDEPENDENT_AMBULATORY_CARE_PROVIDER_SITE_OTHER): Payer: Medicare Other | Admitting: Family Medicine

## 2022-10-28 ENCOUNTER — Encounter: Payer: Self-pay | Admitting: Family Medicine

## 2022-10-28 VITALS — BP 118/84 | HR 91 | Ht 62.0 in | Wt 194.0 lb

## 2022-10-28 DIAGNOSIS — E669 Obesity, unspecified: Secondary | ICD-10-CM

## 2022-10-28 DIAGNOSIS — M1711 Unilateral primary osteoarthritis, right knee: Secondary | ICD-10-CM

## 2022-10-28 DIAGNOSIS — M170 Bilateral primary osteoarthritis of knee: Secondary | ICD-10-CM | POA: Diagnosis not present

## 2022-10-28 NOTE — Assessment & Plan Note (Signed)
Patient does have some obesity, this is likely affecting some of her knee.  Will refer patient to metabolic medicine and healthy weight and wellness to help with weight loss to try to prolong the need for knee replacement.

## 2022-10-28 NOTE — Patient Instructions (Addendum)
Dawn [name]'s office will call you-Weight Management Injected knee today See me again in 8-10 weeks

## 2022-10-28 NOTE — Assessment & Plan Note (Signed)
Right-sided given injection again today.  Tolerated the procedure well, chronic problem with exacerbation.  Still holding off on any type of surgical intervention.  Patient has not responded well to the viscosupplementation previously.  Discussed icing regimen and home exercises, increase activity slowly.  Follow-up again in 6 to 8 weeks

## 2022-11-07 ENCOUNTER — Telehealth: Payer: Self-pay

## 2022-11-07 NOTE — Patient Instructions (Signed)
Visit Information  Thank you for taking time to visit with me today. Please don't hesitate to contact me if I can be of assistance to you.   Following are the goals we discussed today:   Goals Addressed             This Visit's Progress    COMPLETED: Care Coordination Activities - no follow up required       Care Coordination Interventions: Provided education to patient re: benefits of regular exercise, care coordination services, annual wellness visit Assessed social determinant of health barriers            If you are experiencing a Mental Health or [place] or need someone to talk to, please call the Suicide and Crisis Lifeline: 988 call the [place] National Suicide Prevention Lifeline: 639-854-2485 or TTY: 629-036-4569 TTY 8064837932) to talk to a trained counselor call 1-800-273-TALK (toll free, 24 hour hotline) go to [facility] [address] [place] [phone]) call 911   Patient verbalizes understanding of instructions and care plan provided today and agrees to view in [place]. Active MyChart status and patient understanding of how to access instructions and care plan via MyChart confirmed with patient.     No further follow up required:    Peter Garter RN, Jackquline Denmark, [facility] [facility] [phone]

## 2022-11-07 NOTE — Patient Outreach (Signed)
  Care Coordination   Initial Visit Note   11/07/2022 Name: Dawn [name] MRN: [id number] DOB: 1956/01/16  Dawn [name] is a 67 y.o. year old female who sees Kelton Pillar, MD for primary care. I spoke with  Chana Bode by phone today.  What matters to the patients health and wellness today?  No concerns today.  States she is trying to  exercise more   Goals Addressed             This Visit's Progress    COMPLETED: Care Coordination Activities - no follow up required       Care Coordination Interventions: Provided education to patient re: benefits of regular exercise, care coordination services, annual wellness visit Assessed social determinant of health barriers          SDOH assessments and interventions completed:  Yes  SDOH Interventions Today    Flowsheet Row Most Recent Value  SDOH Interventions   Food Insecurity Interventions Intervention Not Indicated  Housing Interventions Intervention Not Indicated  Transportation Interventions Intervention Not Indicated  Utilities Interventions Intervention Not Indicated        Care Coordination Interventions:  Yes, provided  Interventions Today    Flowsheet Row Most Recent Value  Exercise Interventions   Exercise Discussed/Reviewed Exercise Discussed, Physical Activity  Physical Activity Discussed/Reviewed Physical Activity Discussed  Nutrition Interventions   Nutrition Discussed/Reviewed Nutrition Discussed, Increaing proteins       Follow up plan: No further intervention required.   Encounter Outcome:  Pt. Visit Completed  Peter Garter RN, BSN,CCM, CDE Care Management Coordinator [facility] [phone]

## 2022-11-27 ENCOUNTER — Ambulatory Visit: Payer: Medicare Other | Admitting: Family Medicine

## 2022-12-19 NOTE — Progress Notes (Addendum)
[name] [address] [place] [address] Phone: [phone] Subjective:   Dawn [name], am serving as a scribe for Dr. Antoine Primas.  I'm seeing this patient by the request  of:  Maurice Small, MD  CC: Right knee pain, right hand pain  [name]  08/28/2022 Left knee replacement noted.  Will get x-rays to do so patient can make sure that it is appropriate for patient's surgeon to further evaluate.  She is doing significantly well with that at this moment.     Right knee injected today, tolerated the procedure well, discussed icing regimen and home exercises. Discussed which activities to do and which ones to avoid. Increase activity slowly. Follow-up again in 6 to 8 weeks. Chronic problem with worsening symptoms. Still wants to avoid any surgical intervention.     Update 12/31/2022 Dawn [name] is a 67 y.o. female coming in with complaint of L knee replacement. R knee injected last visit. Patient states R knee is painful. Right hand 2nd digit triggering. No other concerns.     Past Medical History:  Diagnosis Date   Abnormal glucose    Allergic rhinitis    Allergy    Anxiety    no per pt   Arthritis    bilateral knees- get cortisone injections   Asthma    as a child   Carpal tunnel syndrome    right   Cervical dysplasia    1996   Endometriosis    Fibroids    Hemorrhoids    High cholesterol    Hyperlipemia    Hypertension    Migraine    Neck pain    Stroke (HCC) 2014   TIA- negative work up in ED 2014   Transient cerebral ischemia    10-22-12 ED, tia s/s but negative work up in ED. see note    Past Surgical History:  Procedure Laterality Date   ABDOMINAL HYSTERECTOMY  Partial   CARPAL TUNNEL RELEASE Right    cold knife cone     for dysplasia   COLONOSCOPY     last 11-2008 w/[facility], hems only    KNEE ARTHROSCOPY WITH MEDIAL MENISECTOMY Right 03/28/2016   Procedure: RIGHT KNEE ARTHROSCOPY CHONDROPLASTY  WITH MEDIAL MENISCECTOMY;  Surgeon: Loreta Ave, MD;  Location: [facility];  Service: Orthopedics;  Laterality: Right;   TRIGGER FINGER RELEASE     thumb, middle finger    TUBAL LIGATION     Social History   Socioeconomic History   Marital status: Married    Spouse name: Not on file   Number of children: 1   Years of education: Not on file   Highest education level: Not on file  Occupational History   Occupation: Retired  Tobacco Use   Smoking status: Never   Smokeless tobacco: Never  Vaping Use   Vaping Use: Never used  Substance and Sexual Activity   Alcohol use: Yes    Alcohol/week: 1.0 standard drink of alcohol    Types: 1 Glasses of wine per week    Comment: occasionally   Drug use: No   Sexual activity: Never  Other Topics Concern   Not on file  Social History Narrative   Not on file   Social Determinants of Health   Financial Resource Strain: Not on file  Food Insecurity: No Food Insecurity (11/07/2022)   Hunger Vital Sign    Worried About Running Out of Food in the Last Year: Never true  Ran Out of Food in the Last Year: Never true  Transportation Needs: No Transportation Needs (11/07/2022)   PRAPARE - Administrator, Civil Service (Medical): No    Lack of Transportation (Non-Medical): No  Physical Activity: Not on file  Stress: Not on file  Social Connections: Not on file   Allergies  Allergen Reactions   Sulfa Antibiotics Rash   Family History  Problem Relation Age of Onset   Colon cancer Father    Stomach cancer Father    Diabetes Mother    Hypertension Mother    CVA Mother    Kidney disease Mother    Aneurysm Mother    Colon cancer Maternal Uncle    Breast cancer Maternal Aunt    Kidney disease Maternal Aunt    Heart disease Brother    Throat cancer Maternal Uncle    Rectal cancer Neg Hx    Esophageal cancer Neg Hx      Current Outpatient Medications (Cardiovascular):    rosuvastatin (CRESTOR) 5 MG tablet,  Take 5 mg by mouth daily.   triamterene-hydrochlorothiazide (MAXZIDE-25) 37.5-25 MG per tablet, Take 1 tablet by mouth daily.  Current Outpatient Medications (Respiratory):    fexofenadine (ALLEGRA) 180 MG tablet, Take 180 mg by mouth daily.  Current Outpatient Medications (Analgesics):    Acetaminophen (TYLENOL PO), Take by mouth as needed.   aspirin 81 MG chewable tablet, Chew 81 mg by mouth daily.   naproxen sodium (ANAPROX) 220 MG tablet, Take 220 mg by mouth 2 (two) times daily with a meal.   Current Outpatient Medications (Other):    cholecalciferol (VITAMIN D) 1000 UNITS tablet, Take 2,000 Units by mouth daily.   cyclobenzaprine (FLEXERIL) 10 MG tablet, Take 10 mg by mouth 3 (three) times daily as needed. For headache   Diclofenac Sodium 2 % SOLN, Place onto the skin.   gabapentin (NEURONTIN) 300 MG capsule, Take 1 capsule (300 mg total) by mouth 3 (three) times daily as needed.   Multiple Vitamin (MULTIVITAMIN WITH MINERALS) TABS, Take 1 tablet by mouth daily.   Na Sulfate-K Sulfate-Mg Sulf (SUPREP BOWEL PREP KIT) 17.5-3.13-1.6 GM/177ML SOLN, Suprep as directed, no substitutions   OVER THE COUNTER MEDICATION, OTC laxative- unsure which kind- uses almost daily   Reviewed prior external information including notes and imaging from  primary care provider As well as notes that were available from care everywhere and other healthcare systems.  Past medical history, social, surgical and family history all reviewed in electronic medical record.  No pertanent information unless stated regarding to the chief complaint.   Review of Systems:  No headache, visual changes, nausea, vomiting, diarrhea, constipation, dizziness, abdominal pain, skin rash, fevers, chills, night sweats, weight loss, swollen lymph nodes, body aches, joint swelling, chest pain, shortness of breath, mood changes. POSITIVE muscle aches  Objective  Blood pressure 126/84, pulse 84, height 5\' 2"  (1.575 m), weight 192  lb (87.1 kg), SpO2 96 %.   General: No apparent distress alert and oriented x3 mood and affect normal, dressed appropriately.  HEENT: Pupils equal, extraocular movements intact  Respiratory: Patient's speak in full sentences and does not appear short of breath  Cardiovascular: No lower extremity edema, non tender, no erythema  Antalgic gait noted.  Patient is ambulatory right knee does have some arthritic changes noted.  Left knee does have the replacement noted.  Does have some instability noted.  No abnormal thigh to calf ratio. Hand exam does have some tenderness at the A2 pulley of  the index finger on the right hand.  Does have some triggering noted today.  Procedure: Real-time Ultrasound Guided Injection of right second flexor tendon sheath Device: GE Logiq Q7 Ultrasound guided injection is preferred based studies that show increased duration, increased effect, greater accuracy, decreased procedural pain, increased response rate, and decreased cost with ultrasound guided versus blind injection.  Verbal informed consent obtained.  Time-out conducted.  Noted no overlying erythema, induration, or other signs of local infection.  Skin prepped in a sterile fashion.  Local anesthesia: Topical Ethyl chloride.  With sterile technique and under real time ultrasound guidance: With a 25-gauge half inch needle injected with 0.5 cc of 0.5% Marcaine and 0.5 cc of Kenalog 40 mg/mL Completed without difficulty  Pain immediately resolved suggesting accurate placement of the medication.  Advised to call if fevers/chills, erythema, induration, drainage, or persistent bleeding.  Impression: Technically successful ultrasound guided injection.   After informed written and verbal consent, patient was seated on exam table. Right knee was prepped with alcohol swab and utilizing anterolateral approach, patient's right knee space was injected with 4:1  marcaine 0.5%: Kenalog 40mg /dL. Patient tolerated the  procedure well without immediate complications.   Impression and Recommendations:     The above documentation has been reviewed and is accurate and complete Judi Saa, DO

## 2022-12-31 ENCOUNTER — Other Ambulatory Visit: Payer: Self-pay

## 2022-12-31 ENCOUNTER — Encounter: Payer: Self-pay | Admitting: Family Medicine

## 2022-12-31 ENCOUNTER — Ambulatory Visit (INDEPENDENT_AMBULATORY_CARE_PROVIDER_SITE_OTHER): Payer: Medicare Other | Admitting: Family Medicine

## 2022-12-31 VITALS — BP 126/84 | HR 84 | Ht 62.0 in | Wt 192.0 lb

## 2022-12-31 DIAGNOSIS — M65321 Trigger finger, right index finger: Secondary | ICD-10-CM

## 2022-12-31 DIAGNOSIS — M79644 Pain in right finger(s): Secondary | ICD-10-CM

## 2022-12-31 DIAGNOSIS — M170 Bilateral primary osteoarthritis of knee: Secondary | ICD-10-CM

## 2022-12-31 NOTE — Patient Instructions (Signed)
Good to see you! You have 14 days to return or exchange your brace Call [phone], then return the brace to our office  See you again in 8 weeks

## 2022-12-31 NOTE — Assessment & Plan Note (Signed)
Injection given today, tolerated the procedure well, discussed with patient about bracing at night.  Discussed hand massage.  Patient has had a trigger thumb nodule surgically removed previously.  Patient knows what this may entail.  Follow-up with me again in 2 months

## 2022-12-31 NOTE — Assessment & Plan Note (Addendum)
Right knee was injected today.  Responded well to the injection.  Discussed with patient icing regimen and home exercises.  Patient is ambulatory but does not ambulate in the brace.  Will continue to work on weight loss.  Patient is going to consider the possibility of replacement at a different time.  Discussed which activities to do and which ones to avoid, increase activity slowly.  Follow-up again 8 weeks hinged brace given today as well.  Help with stability.  Patient is ambulatory

## 2023-02-11 ENCOUNTER — Telehealth: Payer: Self-pay

## 2023-02-11 NOTE — Telephone Encounter (Signed)
Patient called to let us know that her PCP at the [facility] has changed her BP medication from Maxide to the Hctz25mg -Lisinopril20mg  I have changed this in patients chart.

## 2023-02-25 ENCOUNTER — Other Ambulatory Visit: Payer: Self-pay

## 2023-02-25 ENCOUNTER — Ambulatory Visit (INDEPENDENT_AMBULATORY_CARE_PROVIDER_SITE_OTHER): Payer: Medicare Other | Admitting: Family Medicine

## 2023-02-25 VITALS — BP 110/72 | HR 68 | Ht 62.0 in | Wt 189.0 lb

## 2023-02-25 DIAGNOSIS — M1711 Unilateral primary osteoarthritis, right knee: Secondary | ICD-10-CM

## 2023-02-25 DIAGNOSIS — M170 Bilateral primary osteoarthritis of knee: Secondary | ICD-10-CM

## 2023-02-25 DIAGNOSIS — M79644 Pain in right finger(s): Secondary | ICD-10-CM | POA: Diagnosis not present

## 2023-02-25 NOTE — Progress Notes (Signed)
[name] [address] [place] [address] Phone: [phone] Subjective:   Dawn [name], am serving as a scribe for Dr. Antoine Primas.  I'm seeing this patient by the request  of:  Maurice Small, MD (Inactive)  CC: Right knee pain follow-up  [name]  Dawn [name] is a 67 y.o. female coming in with complaint of R index finger pain. Patient given injection last visit. Patient states that her finger pain has subsided since injection.   She is here today for a R knee injection. Patient states that her pain is not as bad as it has been but feels like she cannot wait another month.        Past Medical History:  Diagnosis Date   Abnormal glucose    Allergic rhinitis    Allergy    Anxiety    no per pt   Arthritis    bilateral knees- get cortisone injections   Asthma    as a child   Carpal tunnel syndrome    right   Cervical dysplasia    1996   Endometriosis    Fibroids    Hemorrhoids    High cholesterol    Hyperlipemia    Hypertension    Migraine    Neck pain    Stroke (HCC) 2014   TIA- negative work up in ED 2014   Transient cerebral ischemia    10-22-12 ED, tia s/s but negative work up in ED. see note    Past Surgical History:  Procedure Laterality Date   ABDOMINAL HYSTERECTOMY  Partial   CARPAL TUNNEL RELEASE Right    cold knife cone     for dysplasia   COLONOSCOPY     last 11-2008 w/[facility], hems only    KNEE ARTHROSCOPY WITH MEDIAL MENISECTOMY Right 03/28/2016   Procedure: RIGHT KNEE ARTHROSCOPY CHONDROPLASTY WITH MEDIAL MENISCECTOMY;  Surgeon: Loreta Ave, MD;  Location: [facility];  Service: Orthopedics;  Laterality: Right;   TRIGGER FINGER RELEASE     thumb, middle finger    TUBAL LIGATION     Social History   Socioeconomic History   Marital status: Married    Spouse name: Not on file   Number of children: 1   Years of education: Not on file   Highest education level: Not on  file  Occupational History   Occupation: Retired  Tobacco Use   Smoking status: Never   Smokeless tobacco: Never  Vaping Use   Vaping Use: Never used  Substance and Sexual Activity   Alcohol use: Yes    Alcohol/week: 1.0 standard drink of alcohol    Types: 1 Glasses of wine per week    Comment: occasionally   Drug use: No   Sexual activity: Never  Other Topics Concern   Not on file  Social History Narrative   Not on file   Social Determinants of Health   Financial Resource Strain: Not on file  Food Insecurity: No Food Insecurity (11/07/2022)   Hunger Vital Sign    Worried About Running Out of Food in the Last Year: Never true    Ran Out of Food in the Last Year: Never true  Transportation Needs: No Transportation Needs (11/07/2022)   PRAPARE - Administrator, Civil Service (Medical): No    Lack of Transportation (Non-Medical): No  Physical Activity: Not on file  Stress: Not on file  Social Connections: Not on file   Allergies  Allergen Reactions   Sulfa Antibiotics Rash   Family History  Problem Relation Age of Onset   Colon cancer Father    Stomach cancer Father    Diabetes Mother    Hypertension Mother    CVA Mother    Kidney disease Mother    Aneurysm Mother    Colon cancer Maternal Uncle    Breast cancer Maternal Aunt    Kidney disease Maternal Aunt    Heart disease Brother    Throat cancer Maternal Uncle    Rectal cancer Neg Hx    Esophageal cancer Neg Hx      Current Outpatient Medications (Cardiovascular):    lisinopril-hydrochlorothiazide (ZESTORETIC) 20-25 MG tablet, Take 1 tablet by mouth daily.   rosuvastatin (CRESTOR) 5 MG tablet, Take 5 mg by mouth daily.  Current Outpatient Medications (Respiratory):    fexofenadine (ALLEGRA) 180 MG tablet, Take 180 mg by mouth daily.  Current Outpatient Medications (Analgesics):    Acetaminophen (TYLENOL PO), Take by mouth as needed.   aspirin 81 MG chewable tablet, Chew 81 mg by mouth daily.    naproxen sodium (ANAPROX) 220 MG tablet, Take 220 mg by mouth 2 (two) times daily with a meal.   Current Outpatient Medications (Other):    cholecalciferol (VITAMIN D) 1000 UNITS tablet, Take 2,000 Units by mouth daily.   cyclobenzaprine (FLEXERIL) 10 MG tablet, Take 10 mg by mouth 3 (three) times daily as needed. For headache   Diclofenac Sodium 2 % SOLN, Place onto the skin.   gabapentin (NEURONTIN) 300 MG capsule, Take 1 capsule (300 mg total) by mouth 3 (three) times daily as needed.   Multiple Vitamin (MULTIVITAMIN WITH MINERALS) TABS, Take 1 tablet by mouth daily.   Na Sulfate-K Sulfate-Mg Sulf (SUPREP BOWEL PREP KIT) 17.5-3.13-1.6 GM/177ML SOLN, Suprep as directed, no substitutions   OVER THE COUNTER MEDICATION, OTC laxative- unsure which kind- uses almost daily   Reviewed prior external information including notes and imaging from  primary care provider As well as notes that were available from care everywhere and other healthcare systems.  Past medical history, social, surgical and family history all reviewed in electronic medical record.  No pertanent information unless stated regarding to the chief complaint.   Review of Systems:  No headache, visual changes, nausea, vomiting, diarrhea, constipation, dizziness, abdominal pain, skin rash, fevers, chills, night sweats, weight loss, swollen lymph nodes, body aches,  chest pain, shortness of breath, mood changes. POSITIVE muscle aches, joint swelling  Objective  Blood pressure 110/72, pulse 68, height 5\' 2"  (1.575 m), weight 189 lb (85.7 kg).   General: No apparent distress alert and oriented x3 mood and affect normal, dressed appropriately.  HEENT: Pupils equal, extraocular movements intact  Respiratory: Patient's speak in full sentences and does not appear short of breath  Cardiovascular: No lower extremity edema, non tender, no erythema  Patient does have an antalgic gait noted.  No tenderness to palpation in the paraspinal  musculature a little bit of the back but more around the knee.  Does have crepitus of the knee.  Lateral tracking noted.  After informed written and verbal consent, patient was seated on exam table. Right knee was prepped with alcohol swab and utilizing anterolateral approach, patient's right knee space was injected with 4:1  marcaine 0.5%: Kenalog 40mg /dL. Patient tolerated the procedure well without immediate complications.    Impression and Recommendations:    The above documentation has been reviewed and is accurate and complete Judi Saa, DO

## 2023-02-25 NOTE — Assessment & Plan Note (Signed)
Patient continues to have worsening pain of the right knee.  Is unable to get a replacement anytime soon.  Discussed icing regimen and home exercises, discussed which activities to do and which ones to avoid.  Encouraged her to continue to work on weight loss.  Follow-up with me again in 8 to 10 weeks.

## 2023-02-25 NOTE — Patient Instructions (Addendum)
Injected R knee today Have fun in [place] See me in 8-10 weeks

## 2023-03-13 NOTE — Progress Notes (Signed)
Cardiology Office Note:    Date:  03/13/2023   ID:  Dawn [name], DOB [birth year], MRN [id number]  PCP:  Maurice Small, MD (Inactive)   [facility] Providers Cardiologist:  None     Referring MD: [name], MD   No chief complaint on file. Palpitaitons  History of Present Illness:    Dawn [name] is a 67 y.o. female with a hx of anxiety, HTN, ?TIA, referral for palpitations. Echo in 2014 showed nl function, mild MI/AI. She notes heart fluttering.  Had ziopatch, 37 triggered events mostly for sinus rhythm. Minimal AT, minimal ectopy. No significant tachyarrhythmia or bradyarrhythmia. No atrial fibrillation or flutter. Brother had valve disease. Stated he was born with an anomaly. She is unsure what this was. She's had no cardiac work up. Drinks coffee. TSH is normal.   Past Medical History:  Diagnosis Date   Abnormal glucose    Allergic rhinitis    Allergy    Anxiety    no per pt   Arthritis    bilateral knees- get cortisone injections   Asthma    as a child   Carpal tunnel syndrome    right   Cervical dysplasia    1996   Endometriosis    Fibroids    Hemorrhoids    High cholesterol    Hyperlipemia    Hypertension    Migraine    Neck pain    Stroke (HCC) 2014   TIA- negative work up in ED 2014   Transient cerebral ischemia    10-22-12 ED, tia s/s but negative work up in ED. see note     Past Surgical History:  Procedure Laterality Date   ABDOMINAL HYSTERECTOMY  Partial   CARPAL TUNNEL RELEASE Right    cold knife cone     for dysplasia   COLONOSCOPY     last 11-2008 w/[facility], hems only    KNEE ARTHROSCOPY WITH MEDIAL MENISECTOMY Right 03/28/2016   Procedure: RIGHT KNEE ARTHROSCOPY CHONDROPLASTY WITH MEDIAL MENISCECTOMY;  Surgeon: Loreta Ave, MD;  Location: [facility];  Service: Orthopedics;  Laterality: Right;   TRIGGER FINGER RELEASE     thumb, middle finger    TUBAL LIGATION      Current Medications: Current  Outpatient Medications on File Prior to Visit  Medication Sig Dispense Refill   Acetaminophen (TYLENOL PO) Take by mouth as needed.     aspirin 81 MG chewable tablet Chew 81 mg by mouth daily.     cholecalciferol (VITAMIN D) 1000 UNITS tablet Take 2,000 Units by mouth daily.     cyclobenzaprine (FLEXERIL) 10 MG tablet Take 10 mg by mouth 3 (three) times daily as needed. For headache     lisinopril-hydrochlorothiazide (ZESTORETIC) 20-25 MG tablet Take 1 tablet by mouth daily.     Multiple Vitamin (MULTIVITAMIN WITH MINERALS) TABS Take 1 tablet by mouth daily.     OVER THE COUNTER MEDICATION OTC laxative- unsure which kind- uses almost daily     rosuvastatin (CRESTOR) 5 MG tablet Take 5 mg by mouth daily.     Diclofenac Sodium 2 % SOLN Place onto the skin. (Patient not taking: Reported on 03/14/2023)     fexofenadine (ALLEGRA) 180 MG tablet Take 180 mg by mouth daily. (Patient not taking: Reported on 03/14/2023)     gabapentin (NEURONTIN) 300 MG capsule Take 1 capsule (300 mg total) by mouth 3 (three) times daily as needed. (Patient not taking: Reported on 03/14/2023) 90 capsule 3  Na Sulfate-K Sulfate-Mg Sulf (SUPREP BOWEL PREP KIT) 17.5-3.13-1.6 GM/177ML SOLN Suprep as directed, no substitutions (Patient not taking: Reported on 03/14/2023) 354 mL 0   naproxen sodium (ANAPROX) 220 MG tablet Take 220 mg by mouth 2 (two) times daily with a meal. (Patient not taking: Reported on 03/14/2023)     No current facility-administered medications on file prior to visit.     Allergies:   Sulfa antibiotics   Social History   Socioeconomic History   Marital status: Married    Spouse name: Not on file   Number of children: 1   Years of education: Not on file   Highest education level: Not on file  Occupational History   Occupation: Retired  Tobacco Use   Smoking status: Never   Smokeless tobacco: Never  Vaping Use   Vaping Use: Never used  Substance and Sexual Activity   Alcohol use: Yes     Alcohol/week: 1.0 standard drink of alcohol    Types: 1 Glasses of wine per week    Comment: occasionally   Drug use: No   Sexual activity: Never  Other Topics Concern   Not on file  Social History Narrative   Not on file   Social Determinants of Health   Financial Resource Strain: Not on file  Food Insecurity: No Food Insecurity (11/07/2022)   Hunger Vital Sign    Worried About Running Out of Food in the Last Year: Never true    Ran Out of Food in the Last Year: Never true  Transportation Needs: No Transportation Needs (11/07/2022)   PRAPARE - Administrator, Civil Service (Medical): No    Lack of Transportation (Non-Medical): No  Physical Activity: Not on file  Stress: Not on file  Social Connections: Not on file     Family History: The patient's family history includes Aneurysm in her mother; Breast cancer in her maternal aunt; CVA in her mother; Colon cancer in her father and maternal uncle; Diabetes in her mother; Heart disease in her brother; Hypertension in her mother; Kidney disease in her maternal aunt and mother; Stomach cancer in her father; Throat cancer in her maternal uncle. There is no history of Rectal cancer or Esophageal cancer.  ROS:   Please see the history of present illness.     All other systems reviewed and are negative.  EKGs/Labs/Other Studies Reviewed:    The following studies were reviewed today:   EKG:  EKG is  ordered today.  The ekg ordered today demonstrates   03/14/2023- NSR with sinus arrhythmia  Recent Labs: No results found for requested labs within last 365 days.   Recent Lipid Panel    Component Value Date/Time   CHOL 173 10/22/2012 0924   TRIG 70 10/22/2012 0924   HDL 64 10/22/2012 0924   CHOLHDL 2.7 10/22/2012 0924   VLDL 14 10/22/2012 0924   LDLCALC 95 10/22/2012 0924     Risk Assessment/Calculations:    Physical Exam:    VS:   Vitals:   03/14/23 1357  BP: 132/88  Pulse: 72  SpO2: 96%      Wt  Readings from Last 3 Encounters:  02/25/23 189 lb (85.7 kg)  12/31/22 192 lb (87.1 kg)  10/28/22 194 lb (88 kg)     GEN:  Well nourished, well developed in no acute distress HEENT: Normal NECK: No JVD; No carotid bruits CARDIAC: RRR, no murmurs, rubs, gallops RESPIRATORY:  Clear to auscultation without rales, wheezing or rhonchi  ABDOMEN:  Soft, non-tender, non-distended MUSCULOSKELETAL:  No edema; No deformity  SKIN: Warm and dry NEUROLOGIC:  Alert and oriented x 3 PSYCHIATRIC:  Normal affect   ASSESSMENT:    Palpitations: PLAN:    In order of problems listed above:  Propanolol 20 mg BID PRN Follow PRN           Medication Adjustments/Labs and Tests Ordered: Current medicines are reviewed at length with the patient today.  Concerns regarding medicines are outlined above.  No orders of the defined types were placed in this encounter.  No orders of the defined types were placed in this encounter.   There are no Patient Instructions on file for this visit.   Signed, Maisie Fus, MD  03/13/2023 3:15 PM    [facility]

## 2023-03-14 ENCOUNTER — Ambulatory Visit: Payer: Medicare Other | Attending: Internal Medicine | Admitting: Internal Medicine

## 2023-03-14 VITALS — BP 132/88 | HR 72 | Ht 62.0 in | Wt 189.0 lb

## 2023-03-14 DIAGNOSIS — R002 Palpitations: Secondary | ICD-10-CM

## 2023-03-14 MED ORDER — PROPRANOLOL HCL 20 MG PO TABS: 20.0000 mg | ORAL_TABLET | Freq: Two times a day (BID) | ORAL | 3 refills | Status: AC | PRN

## 2023-03-14 NOTE — Patient Instructions (Signed)
Medication Instructions:  Propranolol 20 mg twice a day as needed for palpitations *If you need a refill on your cardiac medications before your next appointment, please call your pharmacy*  Follow-Up: At [facility], you and your health needs are our priority.  As part of our continuing mission to provide you with exceptional heart care, we have created designated Provider Care Teams.  These Care Teams include your primary Cardiologist (physician) and Advanced Practice Providers (APPs -  Physician Assistants and Nurse Practitioners) who all work together to provide you with the care you need, when you need it.  We recommend signing up for the patient portal called "MyChart".  Sign up information is provided on this After Visit Summary.  MyChart is used to connect with patients for Virtual Visits (Telemedicine).  Patients are able to view lab/test results, encounter notes, upcoming appointments, etc.  Non-urgent messages can be sent to your provider as well.   To learn more about what you can do with MyChart, go to [email or web address].    Your next appointment:    Follow up as needed  Provider:   Dr Wyline Mood

## 2023-04-28 NOTE — Progress Notes (Unsigned)
Dawn [name] [address] [place] [address] Phone: [phone] Subjective:   Dawn [name], am serving as a scribe for Dr. Antoine [name].  I'm seeing this patient by the request  of:  [name], Dawn R, MD  CC: Right knee pain  ION:[id number]  02/25/2023 Patient continues to have worsening pain of the right knee.  Is unable to get a replacement anytime soon.  Discussed icing regimen and home exercises, discussed which activities to do and which ones to avoid.  Encouraged her to continue to work on weight loss.  Follow-up with me again in 8 to 10 weeks.      Update 04/29/2023 Dawn [name] is a 67 y.o. female coming in with complaint of B knee pain. Patient states that at night she develops pain over medial aspect. During the day her pain improves with movement.   Also c/o achiness in R hand 2nd-5th fingers for past 2 weeks. Denies any weakness or tingling in fingers.        Past Medical History:  Diagnosis Date   Abnormal glucose    Allergic rhinitis    Allergy    Anxiety    no per pt   Arthritis    bilateral knees- get cortisone injections   Asthma    as a child   Carpal tunnel syndrome    right   Cervical dysplasia    1996   Endometriosis    Fibroids    Hemorrhoids    High cholesterol    Hyperlipemia    Hypertension    Migraine    Neck pain    Stroke (HCC) 2014   TIA- negative work up in ED 2014   Transient cerebral ischemia    10-22-12 ED, tia s/s but negative work up in ED. see note    Past Surgical History:  Procedure Laterality Date   ABDOMINAL HYSTERECTOMY  Partial   CARPAL TUNNEL RELEASE Right    cold knife cone     for dysplasia   COLONOSCOPY     last 11-2008 w/[facility], hems only    KNEE ARTHROSCOPY WITH MEDIAL MENISECTOMY Right 03/28/2016   Procedure: RIGHT KNEE ARTHROSCOPY CHONDROPLASTY WITH MEDIAL MENISCECTOMY;  Surgeon: Dawn Ave, MD;  Location: [facility];  Service: Orthopedics;   Laterality: Right;   TRIGGER FINGER RELEASE     thumb, middle finger    TUBAL LIGATION     Social History   Socioeconomic History   Marital status: Married    Spouse name: Not on file   Number of children: 1   Years of education: Not on file   Highest education level: Not on file  Occupational History   Occupation: Retired  Tobacco Use   Smoking status: Never   Smokeless tobacco: Never  Vaping Use   Vaping status: Never Used  Substance and Sexual Activity   Alcohol use: Yes    Alcohol/week: 1.0 standard drink of alcohol    Types: 1 Glasses of wine per week    Comment: occasionally   Drug use: No   Sexual activity: Never  Other Topics Concern   Not on file  Social History Narrative   Not on file   Social Determinants of Health   Financial Resource Strain: Not on file  Food Insecurity: No Food Insecurity (11/07/2022)   Hunger Vital Sign    Worried About Running Out of Food in the Last Year: Never true    Ran Out of Food  in the Last Year: Never true  Transportation Needs: No Transportation Needs (11/07/2022)   PRAPARE - Administrator, Civil Service (Medical): No    Lack of Transportation (Non-Medical): No  Physical Activity: Not on file  Stress: Not on file  Social Connections: Not on file   Allergies  Allergen Reactions   Sulfa Antibiotics Rash   Family History  Problem Relation Age of Onset   Colon cancer Father    Stomach cancer Father    Diabetes Mother    Hypertension Mother    CVA Mother    Kidney disease Mother    Aneurysm Mother    Colon cancer Maternal Uncle    Breast cancer Maternal Aunt    Kidney disease Maternal Aunt    Heart disease Brother    Throat cancer Maternal Uncle    Rectal cancer Neg Hx    Esophageal cancer Neg Hx      Current Outpatient Medications (Cardiovascular):    lisinopril-hydrochlorothiazide (ZESTORETIC) 20-25 MG tablet, Take 1 tablet by mouth daily.   propranolol (INDERAL) 20 MG tablet, Take 1 tablet (20 mg  total) by mouth 2 (two) times daily as needed (20 mg twice a day as needed for palpitations).   rosuvastatin (CRESTOR) 5 MG tablet, Take 5 mg by mouth daily.  Current Outpatient Medications (Respiratory):    fexofenadine (ALLEGRA) 180 MG tablet, Take 180 mg by mouth daily.  Current Outpatient Medications (Analgesics):    Acetaminophen (TYLENOL PO), Take by mouth as needed.   aspirin 81 MG chewable tablet, Chew 81 mg by mouth daily.   naproxen sodium (ANAPROX) 220 MG tablet, Take 220 mg by mouth 2 (two) times daily with a meal.   Current Outpatient Medications (Other):    cholecalciferol (VITAMIN D) 1000 UNITS tablet, Take 2,000 Units by mouth daily.   cyclobenzaprine (FLEXERIL) 10 MG tablet, Take 10 mg by mouth 3 (three) times daily as needed. For headache   Diclofenac Sodium 2 % SOLN, Place onto the skin.   gabapentin (NEURONTIN) 300 MG capsule, Take 1 capsule (300 mg total) by mouth 3 (three) times daily as needed.   Multiple Vitamin (MULTIVITAMIN WITH MINERALS) TABS, Take 1 tablet by mouth daily.   Na Sulfate-K Sulfate-Mg Sulf (SUPREP BOWEL PREP KIT) 17.5-3.13-1.6 GM/177ML SOLN, Suprep as directed, no substitutions   OVER THE COUNTER MEDICATION, OTC laxative- unsure which kind- uses almost daily   Reviewed prior external information including notes and imaging from  primary care provider As well as notes that were available from care everywhere and other healthcare systems.  Past medical history, social, surgical and family history all reviewed in electronic medical record.  No pertanent information unless stated regarding to the chief complaint.   Review of Systems:  No headache, visual changes, nausea, vomiting, diarrhea, constipation, dizziness, abdominal pain, skin rash, fevers, chills, night sweats, weight loss, swollen lymph nodes, body aches, joint swelling, chest pain, shortness of breath, mood changes. POSITIVE muscle aches  Objective  Blood pressure 122/82, pulse 87,  height 5\' 2"  (1.575 m), weight 189 lb (85.7 kg), SpO2 97%.   General: No apparent distress alert and oriented x3 mood and affect normal, dressed appropriately.  HEENT: Pupils equal, extraocular movements intact  Respiratory: Patient's speak in full sentences and does not appear short of breath  Cardiovascular: lower extremity edema, non tender, no erythema  Patient does have some tenderness to palpation in the paraspinal musculature.  Patient does have tightness noted with arthritic changes are noted of the  right knee.  Does have crepitus noted.  Right wrist exam does have severe positive Tinel's noted.  Patient does have positive changes noted.  Tightness noted at the wrist level.  After informed written and verbal consent, patient was seated on exam table. Right knee was prepped with alcohol swab and utilizing anterolateral approach, patient's right knee space was injected with 4:1  marcaine 0.5%: Kenalog 40mg /dL. Patient tolerated the procedure well without immediate complications.    Impression and Recommendations:    The above documentation has been reviewed and is accurate and complete Judi Saa, DO

## 2023-04-29 ENCOUNTER — Ambulatory Visit (INDEPENDENT_AMBULATORY_CARE_PROVIDER_SITE_OTHER): Payer: Medicare Other | Admitting: Family Medicine

## 2023-04-29 ENCOUNTER — Encounter: Payer: Self-pay | Admitting: Family Medicine

## 2023-04-29 VITALS — BP 122/82 | HR 87 | Ht 62.0 in | Wt 189.0 lb

## 2023-04-29 DIAGNOSIS — M1711 Unilateral primary osteoarthritis, right knee: Secondary | ICD-10-CM

## 2023-04-29 DIAGNOSIS — G5601 Carpal tunnel syndrome, right upper limb: Secondary | ICD-10-CM | POA: Insufficient documentation

## 2023-04-29 DIAGNOSIS — M170 Bilateral primary osteoarthritis of knee: Secondary | ICD-10-CM

## 2023-04-29 NOTE — Assessment & Plan Note (Signed)
Injection given, tolerated the procedure well, discussed icing regimen and home exercises patient does likely will need a replacement in the future.  Could consider viscosupplementation but has not made as much improvement with the right knee previously.  Continue work on weight loss, home exercises, follow-up again in 6 to 8 weeks otherwise.

## 2023-04-29 NOTE — Assessment & Plan Note (Signed)
History of surgery previously but does seem to have some increasing pain with Tinel's.  Discussed with patient about bracing, home exercises, worsening symptoms will consider the possibility of injections.

## 2023-04-29 NOTE — Patient Instructions (Signed)
Injected knee Wrist brace day and night for 2 weeks then nightly for 2 weeks See me again in 2 months

## 2023-05-06 ENCOUNTER — Ambulatory Visit: Payer: Medicare Other | Admitting: Family Medicine

## 2023-06-27 NOTE — Progress Notes (Unsigned)
[name] [address] [place] [address] Phone: [phone] Subjective:   Dawn [name], am serving as a scribe for Dr. Antoine Primas.  I'm seeing this patient by the request  of:  Pahwani, Rinka R, MD  CC: Right knee pain  [name]  04/29/2023 History of surgery previously but does seem to have some increasing pain with Tinel's.  Discussed with patient about bracing, home exercises, worsening symptoms will consider the possibility of injections.     Injection given, tolerated the procedure well, discussed icing regimen and home exercises patient does likely will need a replacement in the future.  Could consider viscosupplementation but has not made as much improvement with the right knee previously.  Continue work on weight loss, home exercises, follow-up again in 6 to 8 weeks otherwise.     Update 07/01/2023 Dawn [name] is a 67 y.o. female coming in with complaint of R wrist and B knee pain. Patient states that she is the same as last visit. Patient would like to know if it is a good time for her to look into knee replacement for R knee.   Wrist pain in the same as well. Will have tingling and numbness in hand.      Past Medical History:  Diagnosis Date   Abnormal glucose    Allergic rhinitis    Allergy    Anxiety    no per pt   Arthritis    bilateral knees- get cortisone injections   Asthma    as a child   Carpal tunnel syndrome    right   Cervical dysplasia    1996   Endometriosis    Fibroids    Hemorrhoids    High cholesterol    Hyperlipemia    Hypertension    Migraine    Neck pain    Stroke (HCC) 2014   TIA- negative work up in ED 2014   Transient cerebral ischemia    10-22-12 ED, tia s/s but negative work up in ED. see note    Past Surgical History:  Procedure Laterality Date   ABDOMINAL HYSTERECTOMY  Partial   CARPAL TUNNEL RELEASE Right    cold knife cone     for dysplasia   COLONOSCOPY     last  11-2008 w/[facility], hems only    KNEE ARTHROSCOPY WITH MEDIAL MENISECTOMY Right 03/28/2016   Procedure: RIGHT KNEE ARTHROSCOPY CHONDROPLASTY WITH MEDIAL MENISCECTOMY;  Surgeon: Loreta Ave, MD;  Location: [facility];  Service: Orthopedics;  Laterality: Right;   TRIGGER FINGER RELEASE     thumb, middle finger    TUBAL LIGATION     Social History   Socioeconomic History   Marital status: Married    Spouse name: Not on file   Number of children: 1   Years of education: Not on file   Highest education level: Not on file  Occupational History   Occupation: Retired  Tobacco Use   Smoking status: Never   Smokeless tobacco: Never  Vaping Use   Vaping status: Never Used  Substance and Sexual Activity   Alcohol use: Yes    Alcohol/week: 1.0 standard drink of alcohol    Types: 1 Glasses of wine per week    Comment: occasionally   Drug use: No   Sexual activity: Never  Other Topics Concern   Not on file  Social History Narrative   Not on file   Social Determinants of Health   Financial  Resource Strain: Not on file  Food Insecurity: No Food Insecurity (11/07/2022)   Hunger Vital Sign    Worried About Running Out of Food in the Last Year: Never true    Ran Out of Food in the Last Year: Never true  Transportation Needs: No Transportation Needs (11/07/2022)   PRAPARE - Administrator, Civil Service (Medical): No    Lack of Transportation (Non-Medical): No  Physical Activity: Not on file  Stress: Not on file  Social Connections: Not on file   Allergies  Allergen Reactions   Sulfa Antibiotics Rash   Family History  Problem Relation Age of Onset   Colon cancer Father    Stomach cancer Father    Diabetes Mother    Hypertension Mother    CVA Mother    Kidney disease Mother    Aneurysm Mother    Colon cancer Maternal Uncle    Breast cancer Maternal Aunt    Kidney disease Maternal Aunt    Heart disease Brother    Throat cancer Maternal Uncle     Rectal cancer Neg Hx    Esophageal cancer Neg Hx      Current Outpatient Medications (Cardiovascular):    LISINOPRIL PO, Take by mouth.   lisinopril-hydrochlorothiazide (ZESTORETIC) 20-25 MG tablet, Take 1 tablet by mouth daily.   propranolol (INDERAL) 20 MG tablet, Take 1 tablet (20 mg total) by mouth 2 (two) times daily as needed (20 mg twice a day as needed for palpitations).   rosuvastatin (CRESTOR) 5 MG tablet, Take 5 mg by mouth daily.  Current Outpatient Medications (Respiratory):    fexofenadine (ALLEGRA) 180 MG tablet, Take 180 mg by mouth daily.  Current Outpatient Medications (Analgesics):    Acetaminophen (TYLENOL PO), Take by mouth as needed.   aspirin 81 MG chewable tablet, Chew 81 mg by mouth daily.   naproxen sodium (ANAPROX) 220 MG tablet, Take 220 mg by mouth 2 (two) times daily with a meal.   Current Outpatient Medications (Other):    cholecalciferol (VITAMIN D) 1000 UNITS tablet, Take 2,000 Units by mouth daily.   cyclobenzaprine (FLEXERIL) 10 MG tablet, Take 10 mg by mouth 3 (three) times daily as needed. For headache   Diclofenac Sodium 2 % SOLN, Place onto the skin.   gabapentin (NEURONTIN) 300 MG capsule, Take 1 capsule (300 mg total) by mouth 3 (three) times daily as needed.   Multiple Vitamin (MULTIVITAMIN WITH MINERALS) TABS, Take 1 tablet by mouth daily.   Na Sulfate-K Sulfate-Mg Sulf (SUPREP BOWEL PREP KIT) 17.5-3.13-1.6 GM/177ML SOLN, Suprep as directed, no substitutions   OVER THE COUNTER MEDICATION, OTC laxative- unsure which kind- uses almost daily   Reviewed prior external information including notes and imaging from  primary care provider As well as notes that were available from care everywhere and other healthcare systems.  Past medical history, social, surgical and family history all reviewed in electronic medical record.  No pertanent information unless stated regarding to the chief complaint.   Review of Systems:  No headache, visual  changes, nausea, vomiting, diarrhea, constipation, dizziness, abdominal pain, skin rash, fevers, chills, night sweats, weight loss, swollen lymph nodes, body aches, joint swelling, chest pain, shortness of breath, mood changes. POSITIVE muscle aches  Objective  Blood pressure 110/76, pulse 83, height 5\' 2"  (1.575 m), weight 186 lb (84.4 kg), SpO2 99%.   General: No apparent distress alert and oriented x3 mood and affect normal, dressed appropriately.  HEENT: Pupils equal, extraocular movements intact  Respiratory:  Patient's speak in full sentences and does not appear short of breath  Cardiovascular: No lower extremity edema, non tender, no erythema  Mild antalgic gait noted.  Postsurgical changes noted of the left knee.  Does have a lipoma noted on the anterior lateral aspect of this knee.  Has greater than 110 degrees of flexion of the knee though noted. Right knee does have some arthritic changes noted.  Crepitus noted, abnormal thigh to calf ratio noted.  After informed written and verbal consent, patient was seated on exam table. Right knee was prepped with alcohol swab and utilizing anterolateral approach, patient's right knee space was injected with 4:1  marcaine 0.5%: Kenalog 40mg /dL. Patient tolerated the procedure well without immediate complications.    Impression and Recommendations:     The above documentation has been reviewed and is accurate and complete Judi Saa, DO

## 2023-07-01 ENCOUNTER — Encounter: Payer: Self-pay | Admitting: Family Medicine

## 2023-07-01 ENCOUNTER — Ambulatory Visit (INDEPENDENT_AMBULATORY_CARE_PROVIDER_SITE_OTHER): Payer: Medicare Other | Admitting: Family Medicine

## 2023-07-01 ENCOUNTER — Ambulatory Visit (INDEPENDENT_AMBULATORY_CARE_PROVIDER_SITE_OTHER): Payer: Medicare Other

## 2023-07-01 VITALS — BP 110/76 | HR 83 | Ht 62.0 in | Wt 186.0 lb

## 2023-07-01 DIAGNOSIS — M25562 Pain in left knee: Secondary | ICD-10-CM | POA: Diagnosis not present

## 2023-07-01 DIAGNOSIS — M25561 Pain in right knee: Secondary | ICD-10-CM

## 2023-07-01 DIAGNOSIS — M170 Bilateral primary osteoarthritis of knee: Secondary | ICD-10-CM

## 2023-07-01 DIAGNOSIS — G8929 Other chronic pain: Secondary | ICD-10-CM

## 2023-07-01 NOTE — Patient Instructions (Signed)
Injection in R knee today Xray today See me again in 2-3 months

## 2023-07-01 NOTE — Assessment & Plan Note (Signed)
Repeat injection given for the right knee.  Chronic problem with worsening symptoms.  Patient is going to consider the possibility of surgical intervention at some point in the near future.  Discussed icing regimen and home exercises, discussed which activities to do and which ones to avoid.  Increase activity slowly.  Follow-up with me again in 8 to 10 weeks

## 2023-08-04 ENCOUNTER — Other Ambulatory Visit (HOSPITAL_BASED_OUTPATIENT_CLINIC_OR_DEPARTMENT_OTHER): Payer: Self-pay | Admitting: Internal Medicine

## 2023-08-04 DIAGNOSIS — Z136 Encounter for screening for cardiovascular disorders: Secondary | ICD-10-CM

## 2023-08-06 ENCOUNTER — Other Ambulatory Visit (HOSPITAL_COMMUNITY): Payer: Self-pay | Admitting: Internal Medicine

## 2023-08-06 DIAGNOSIS — I10 Essential (primary) hypertension: Secondary | ICD-10-CM

## 2023-08-06 DIAGNOSIS — I498 Other specified cardiac arrhythmias: Secondary | ICD-10-CM

## 2023-08-08 ENCOUNTER — Ambulatory Visit
Admission: RE | Admit: 2023-08-08 | Discharge: 2023-08-08 | Disposition: A | Discharge status: Home / Self Care | Payer: Medicare Other | Source: Ambulatory Visit | Attending: Internal Medicine

## 2023-08-08 DIAGNOSIS — Z136 Encounter for screening for cardiovascular disorders: Secondary | ICD-10-CM | POA: Insufficient documentation

## 2023-08-26 ENCOUNTER — Ambulatory Visit (HOSPITAL_COMMUNITY): Payer: Medicare Other | Attending: Internal Medicine

## 2023-08-26 DIAGNOSIS — I498 Other specified cardiac arrhythmias: Secondary | ICD-10-CM | POA: Insufficient documentation

## 2023-08-26 DIAGNOSIS — I10 Essential (primary) hypertension: Secondary | ICD-10-CM | POA: Diagnosis present

## 2023-08-26 LAB — ECHOCARDIOGRAM COMPLETE
AV Vena cont: 0.17 cm
S' Lateral: 2.66 cm

## 2023-09-15 NOTE — Progress Notes (Signed)
[name] [address] [place] [address] Phone: [phone] Subjective:   Dawn [name], am serving as a scribe for Dr. Antoine Primas.  I'm seeing this patient by the request  of:  Pahwani, Rinka R, MD  CC: right knee pain follow up   ONG:[id number]  07/01/2023 Repeat injection given for the right knee.  Chronic problem with worsening symptoms.  Patient is going to consider the possibility of surgical intervention at some point in the near future.  Discussed icing regimen and home exercises, discussed which activities to do and which ones to avoid.  Increase activity slowly.  Follow-up with me again in 8 to 10 weeks      Update 09/16/2023 Dawn [name] is a 67 y.o. female coming in with complaint of R knee pain. Patient states that injection is wearing off but it lasted longer than the last one.       Past Medical History:  Diagnosis Date   Abnormal glucose    Allergic rhinitis    Allergy    Anxiety    no per pt   Arthritis    bilateral knees- get cortisone injections   Asthma    as a child   Carpal tunnel syndrome    right   Cervical dysplasia    1996   Endometriosis    Fibroids    Hemorrhoids    High cholesterol    Hyperlipemia    Hypertension    Migraine    Neck pain    Stroke (HCC) 2014   TIA- negative work up in ED 2014   Transient cerebral ischemia    10-22-12 ED, tia s/s but negative work up in ED. see note    Past Surgical History:  Procedure Laterality Date   ABDOMINAL HYSTERECTOMY  Partial   CARPAL TUNNEL RELEASE Right    cold knife cone     for dysplasia   COLONOSCOPY     last 11-2008 w/[facility], [facility]ms only    KNEE ARTHROSCOPY WITH MEDIAL MENISECTOMY Right 03/28/2016   Procedure: RIGHT KNEE ARTHROSCOPY CHONDROPLASTY WITH MEDIAL MENISCECTOMY;  Surgeon: Loreta Ave, MD;  Location: [facility];  Service: Orthopedics;  Laterality: Right;   TRIGGER FINGER RELEASE     thumb, middle finger     TUBAL LIGATION     Social History   Socioeconomic History   Marital status: Married    Spouse name: Not on file   Number of children: 1   Years of education: Not on file   Highest education level: Not on file  Occupational History   Occupation: Retired  Tobacco Use   Smoking status: Never   Smokeless tobacco: Never  Vaping Use   Vaping status: Never Used  Substance and Sexual Activity   Alcohol use: Yes    Alcohol/week: 1.0 standard drink of alcohol    Types: 1 Glasses of wine per week    Comment: occasionally   Drug use: No   Sexual activity: Never  Other Topics Concern   Not on file  Social History Narrative   Not on file   Social Drivers of Health   Financial Resource Strain: Not on file  Food Insecurity: No Food Insecurity (11/07/2022)   Hunger Vital Sign    Worried About Running Out of Food in the Last Year: Never true    Ran Out of Food in the Last Year: Never true  Transportation Needs: No Transportation Needs (11/07/2022)   PRAPARE -  Administrator, Civil Service (Medical): No    Lack of Transportation (Non-Medical): No  Physical Activity: Not on file  Stress: Not on file  Social Connections: Not on file   Allergies  Allergen Reactions   Sulfa Antibiotics Rash   Family History  Problem Relation Age of Onset   Colon cancer Father    Stomach cancer Father    Diabetes Mother    Hypertension Mother    CVA Mother    Kidney disease Mother    Aneurysm Mother    Colon cancer Maternal Uncle    Breast cancer Maternal Aunt    Kidney disease Maternal Aunt    Heart disease Brother    Throat cancer Maternal Uncle    Rectal cancer Neg Hx    Esophageal cancer Neg Hx      Current Outpatient Medications (Cardiovascular):    LISINOPRIL PO, Take by mouth.   lisinopril-hydrochlorothiazide (ZESTORETIC) 20-25 MG tablet, Take 1 tablet by mouth daily.   propranolol (INDERAL) 20 MG tablet, Take 1 tablet (20 mg total) by mouth 2 (two) times daily as needed  (20 mg twice a day as needed for palpitations).   rosuvastatin (CRESTOR) 5 MG tablet, Take 5 mg by mouth daily.  Current Outpatient Medications (Respiratory):    fexofenadine (ALLEGRA) 180 MG tablet, Take 180 mg by mouth daily.  Current Outpatient Medications (Analgesics):    Acetaminophen (TYLENOL PO), Take by mouth as needed.   aspirin 81 MG chewable tablet, Chew 81 mg by mouth daily.   naproxen sodium (ANAPROX) 220 MG tablet, Take 220 mg by mouth 2 (two) times daily with a meal.   Current Outpatient Medications (Other):    cholecalciferol (VITAMIN D) 1000 UNITS tablet, Take 2,000 Units by mouth daily.   cyclobenzaprine (FLEXERIL) 10 MG tablet, Take 10 mg by mouth 3 (three) times daily as needed. For headache   Diclofenac Sodium 2 % SOLN, Place onto the skin.   gabapentin (NEURONTIN) 300 MG capsule, Take 1 capsule (300 mg total) by mouth 3 (three) times daily as needed.   Multiple Vitamin (MULTIVITAMIN WITH MINERALS) TABS, Take 1 tablet by mouth daily.   Na Sulfate-K Sulfate-Mg Sulf (SUPREP BOWEL PREP KIT) 17.5-3.13-1.6 GM/177ML SOLN, Suprep as directed, no substitutions   OVER THE COUNTER MEDICATION, OTC laxative- unsure which kind- uses almost daily   Reviewed prior external information including notes and imaging from  primary care provider As well as notes that were available from care everywhere and other healthcare systems.  Past medical history, social, surgical and family history all reviewed in electronic medical record.  No pertanent information unless stated regarding to the chief complaint.   Review of Systems:  No headache, visual changes, nausea, vomiting, diarrhea, constipation, dizziness, abdominal pain, skin rash, fevers, chills, night sweats, weight loss, swollen lymph nodes, body aches, joint swelling, chest pain, shortness of breath, mood changes. POSITIVE muscle aches  Objective  Blood pressure 124/82, pulse 80, height 5\' 2"  (1.575 m), weight 182 lb (82.6 kg),  SpO2 98%.   General: No apparent distress alert and oriented x3 mood and affect normal, dressed appropriately.  HEENT: Pupils equal, extraocular movements intact  Respiratory: Patient's speak in full sentences and does not appear short of breath  Cardiovascular: No lower extremity edema, non tender, no erythema  Mild antalgic gait noted.  Right knee does have arthritic changes noted.  Instability with valgus and varus force noted.  After informed written and verbal consent, patient was seated on exam table.  Right knee was prepped with alcohol swab and utilizing anterolateral approach, patient's right knee space was injected with 4:1  marcaine 0.5%: Kenalog 40mg /dL. Patient tolerated the procedure well without immediate complications.    Impression and Recommendations:    The above documentation has been reviewed and is accurate and complete Judi Saa, DO

## 2023-09-16 ENCOUNTER — Ambulatory Visit: Payer: Medicare Other | Admitting: Family Medicine

## 2023-09-16 ENCOUNTER — Encounter: Payer: Self-pay | Admitting: Family Medicine

## 2023-09-16 VITALS — BP 124/82 | HR 80 | Ht 62.0 in | Wt 182.0 lb

## 2023-09-16 DIAGNOSIS — M1711 Unilateral primary osteoarthritis, right knee: Secondary | ICD-10-CM | POA: Diagnosis not present

## 2023-09-16 DIAGNOSIS — M170 Bilateral primary osteoarthritis of knee: Secondary | ICD-10-CM

## 2023-09-16 NOTE — Assessment & Plan Note (Signed)
Will repeat right knee injection given again today.  Tolerated the procedure well.  Discussed icing regimen and home exercises.  Can repeat every 10 weeks if necessary.  Discussed with patient that this chronic worsening pain would need to consider the possibility of surgical intervention.  Patient is hoping to avoid that for a little bit longer.  Is about to celebrate her husband's 85[age] birthday.

## 2023-09-16 NOTE — Patient Instructions (Signed)
Injected knee today Enjoy your trip See me again in 8-10 weeks

## 2023-11-10 NOTE — Progress Notes (Signed)
[name] [address] [place] [address] Phone: [phone] Subjective:   [name], am serving as a scribe for Dr. Antoine Primas.  I'm seeing this patient by the request  of:  Pahwani, Rinka R, MD  CC: bilateral knee pain   ONG:[id number]  09/16/2023 Will repeat right knee injection given again today.  Tolerated the procedure well.  Discussed icing regimen and home exercises.  Can repeat every 10 weeks if necessary.  Discussed with patient that this chronic worsening pain would need to consider the possibility of surgical intervention.  Patient is hoping to avoid that for a little bit longer.  Is about to celebrate her husband's 84th birthday.      Update 11/11/2023 KEAJA REAUME is a 68 y.o. female coming in with complaint of B knee pain. Has replacement on left side. Patient states that she overdid it in the [place] and pain R knee increased.        Past Medical History:  Diagnosis Date   Abnormal glucose    Allergic rhinitis    Allergy    Anxiety    no per pt   Arthritis    bilateral knees- get cortisone injections   Asthma    as a child   Carpal tunnel syndrome    right   Cervical dysplasia    1996   Endometriosis    Fibroids    Hemorrhoids    High cholesterol    Hyperlipemia    Hypertension    Migraine    Neck pain    Stroke (HCC) 2014   TIA- negative work up in ED 2014   Transient cerebral ischemia    10-22-12 ED, tia s/s but negative work up in ED. see note    Past Surgical History:  Procedure Laterality Date   ABDOMINAL HYSTERECTOMY  Partial   CARPAL TUNNEL RELEASE Right    cold knife cone     for dysplasia   COLONOSCOPY     last 11-2008 w/[facility], hems only    KNEE ARTHROSCOPY WITH MEDIAL MENISECTOMY Right 03/28/2016   Procedure: RIGHT KNEE ARTHROSCOPY CHONDROPLASTY WITH MEDIAL MENISCECTOMY;  Surgeon: Loreta Ave, MD;  Location: [facility];  Service: Orthopedics;  Laterality:  Right;   TRIGGER FINGER RELEASE     thumb, middle finger    TUBAL LIGATION     Social History   Socioeconomic History   Marital status: Married    Spouse name: Not on file   Number of children: 1   Years of education: Not on file   Highest education level: Not on file  Occupational History   Occupation: Retired  Tobacco Use   Smoking status: Never   Smokeless tobacco: Never  Vaping Use   Vaping status: Never Used  Substance and Sexual Activity   Alcohol use: Yes    Alcohol/week: 1.0 standard drink of alcohol    Types: 1 Glasses of wine per week    Comment: occasionally   Drug use: No   Sexual activity: Never  Other Topics Concern   Not on file  Social History Narrative   Not on file   Social Drivers of Health   Financial Resource Strain: Not on file  Food Insecurity: No Food Insecurity (11/07/2022)   Hunger Vital Sign    Worried About Running Out of Food in the Last Year: Never true    Ran Out of Food in the Last Year: Never true  Transportation Needs:  No Transportation Needs (11/07/2022)   PRAPARE - Administrator, Civil Service (Medical): No    Lack of Transportation (Non-Medical): No  Physical Activity: Not on file  Stress: Not on file  Social Connections: Not on file   Allergies  Allergen Reactions   Sulfa Antibiotics Rash   Family History  Problem Relation Age of Onset   Colon cancer Father    Stomach cancer Father    Diabetes Mother    Hypertension Mother    CVA Mother    Kidney disease Mother    Aneurysm Mother    Colon cancer Maternal Uncle    Breast cancer Maternal Aunt    Kidney disease Maternal Aunt    Heart disease Brother    Throat cancer Maternal Uncle    Rectal cancer Neg Hx    Esophageal cancer Neg Hx      Current Outpatient Medications (Cardiovascular):    LISINOPRIL PO, Take by mouth.   lisinopril-hydrochlorothiazide (ZESTORETIC) 20-25 MG tablet, Take 1 tablet by mouth daily.   propranolol (INDERAL) 20 MG tablet, Take 1  tablet (20 mg total) by mouth 2 (two) times daily as needed (20 mg twice a day as needed for palpitations).   rosuvastatin (CRESTOR) 5 MG tablet, Take 5 mg by mouth daily.  Current Outpatient Medications (Respiratory):    fexofenadine (ALLEGRA) 180 MG tablet, Take 180 mg by mouth daily.  Current Outpatient Medications (Analgesics):    Acetaminophen (TYLENOL PO), Take by mouth as needed.   aspirin 81 MG chewable tablet, Chew 81 mg by mouth daily.   naproxen sodium (ANAPROX) 220 MG tablet, Take 220 mg by mouth 2 (two) times daily with a meal.   Current Outpatient Medications (Other):    cholecalciferol (VITAMIN D) 1000 UNITS tablet, Take 2,000 Units by mouth daily.   cyclobenzaprine (FLEXERIL) 10 MG tablet, Take 10 mg by mouth 3 (three) times daily as needed. For headache   Diclofenac Sodium 2 % SOLN, Place onto the skin.   gabapentin (NEURONTIN) 300 MG capsule, Take 1 capsule (300 mg total) by mouth 3 (three) times daily as needed.   Multiple Vitamin (MULTIVITAMIN WITH MINERALS) TABS, Take 1 tablet by mouth daily.   Na Sulfate-K Sulfate-Mg Sulf (SUPREP BOWEL PREP KIT) 17.5-3.13-1.6 GM/177ML SOLN, Suprep as directed, no substitutions   OVER THE COUNTER MEDICATION, OTC laxative- unsure which kind- uses almost daily   Reviewed prior external information including notes and imaging from  primary care provider As well as notes that were available from care everywhere and other healthcare systems.  Past medical history, social, surgical and family history all reviewed in electronic medical record.  No pertanent information unless stated regarding to the chief complaint.   Review of Systems:  No headache, visual changes, nausea, vomiting, diarrhea, constipation, dizziness, abdominal pain, skin rash, fevers, chills, night sweats, weight loss, swollen lymph nodes, body aches, joint swelling, chest pain, shortness of breath, mood changes. POSITIVE muscle aches  Objective  Blood pressure 108/72,  pulse 83, height 5\' 2"  (1.575 m), weight 188 lb (85.3 kg), SpO2 96%.   General: No apparent distress alert and oriented x3 mood and affect normal, dressed appropriately.  HEENT: Pupils equal, extraocular movements intact  Respiratory: Patient's speak in full sentences and does not appear short of breath  Cardiovascular: No lower extremity edema, non tender, no erythema  Instability of the right knee diffusely tender to palpation.  Trace effusion noted.  After informed written and verbal consent, patient was seated on exam  table. Right knee was prepped with alcohol swab and utilizing anterolateral approach, patient's right knee space was injected with 4:1  marcaine 0.5%: Kenalog 40mg /dL. Patient tolerated the procedure well without immediate complications.    Impression and Recommendations:     The above documentation has been reviewed and is accurate and complete Judi Saa, DO

## 2023-11-11 ENCOUNTER — Encounter: Payer: Self-pay | Admitting: Family Medicine

## 2023-11-11 ENCOUNTER — Ambulatory Visit: Payer: Medicare Other | Admitting: Family Medicine

## 2023-11-11 VITALS — BP 108/72 | HR 83 | Ht 62.0 in | Wt 188.0 lb

## 2023-11-11 DIAGNOSIS — M170 Bilateral primary osteoarthritis of knee: Secondary | ICD-10-CM | POA: Diagnosis not present

## 2023-11-11 NOTE — Patient Instructions (Addendum)
Injected R knee today See me again in 10 weeks

## 2023-11-11 NOTE — Assessment & Plan Note (Signed)
Patient was given another injection in the right knee.  Still trying to avoid surgical intervention at this moment.  Discussed icing regimen and home exercises, which activities to do and which ones to avoid.  Increase activity slowly.,

## 2024-01-15 NOTE — Progress Notes (Signed)
 [facility] [address] [place] [address] Phone: [phone] Subjective:   Dawn [name], am serving as a scribe for Dr. Ronnell Coins.  I'm seeing this patient by the request  of:  [name], Dawn R, MD  CC: Right knee pain  NGE:[id number]  11/11/2023 Patient was given another injection in the right knee.  Still trying to avoid surgical intervention at this moment.  Discussed icing regimen and home exercises, which activities to do and which ones to avoid.  Increase activity slowly.,     Update 01/20/2024 Dawn [name] is a 68 y.o. female coming in with complaint of R knee pain. Patient states that she is better but would like injection.  Does have some very mild instability.  Has noticed more swelling more recently.     Past Medical History:  Diagnosis Date   Abnormal glucose    Allergic rhinitis    Allergy    Anxiety    no per pt   Arthritis    bilateral knees- get cortisone injections   Asthma    as a child   Carpal tunnel syndrome    right   Cervical dysplasia    1996   Endometriosis    Fibroids    Hemorrhoids    High cholesterol    Hyperlipemia    Hypertension    Migraine    Neck pain    Stroke (HCC) 2014   TIA- negative work up in ED 2014   Transient cerebral ischemia    10-22-12 ED, tia s/s but negative work up in ED. see note    Past Surgical History:  Procedure Laterality Date   ABDOMINAL HYSTERECTOMY  Partial   CARPAL TUNNEL RELEASE Right    cold knife cone     for dysplasia   COLONOSCOPY     last 11-2008 w/[facility], hems only    KNEE ARTHROSCOPY WITH MEDIAL MENISECTOMY Right 03/28/2016   Procedure: RIGHT KNEE ARTHROSCOPY CHONDROPLASTY WITH MEDIAL MENISCECTOMY;  Surgeon: Ferd Householder, MD;  Location: [facility];  Service: Orthopedics;  Laterality: Right;   TRIGGER FINGER RELEASE     thumb, middle finger    TUBAL LIGATION     Social History   Socioeconomic History   Marital status:  Married    Spouse name: Not on file   Number of children: 1   Years of education: Not on file   Highest education level: Not on file  Occupational History   Occupation: Retired  Tobacco Use   Smoking status: Never   Smokeless tobacco: Never  Vaping Use   Vaping status: Never Used  Substance and Sexual Activity   Alcohol use: Yes    Alcohol/week: 1.0 standard drink of alcohol    Types: 1 Glasses of wine per week    Comment: occasionally   Drug use: No   Sexual activity: Never  Other Topics Concern   Not on file  Social History Narrative   Not on file   Social Drivers of Health   Financial Resource Strain: Not on file  Food Insecurity: No Food Insecurity (11/07/2022)   Hunger Vital Sign    Worried About Running Out of Food in the Last Year: Never true    Ran Out of Food in the Last Year: Never true  Transportation Needs: No Transportation Needs (11/07/2022)   PRAPARE - Administrator, Civil Service (Medical): No    Lack of Transportation (Non-Medical): No  Physical Activity:  Not on file  Stress: Not on file  Social Connections: Not on file   Allergies  Allergen Reactions   Sulfa Antibiotics Rash   Family History  Problem Relation Age of Onset   Colon cancer Father    Stomach cancer Father    Diabetes Mother    Hypertension Mother    CVA Mother    Kidney disease Mother    Aneurysm Mother    Colon cancer Maternal Uncle    Breast cancer Maternal Aunt    Kidney disease Maternal Aunt    Heart disease Brother    Throat cancer Maternal Uncle    Rectal cancer Neg Hx    Esophageal cancer Neg Hx      Current Outpatient Medications (Cardiovascular):    LISINOPRIL PO, Take by mouth.   lisinopril-hydrochlorothiazide (ZESTORETIC) 20-25 MG tablet, Take 1 tablet by mouth daily.   propranolol  (INDERAL ) 20 MG tablet, Take 1 tablet (20 mg total) by mouth 2 (two) times daily as needed (20 mg twice a day as needed for palpitations).   rosuvastatin (CRESTOR) 5 MG  tablet, Take 5 mg by mouth daily.  Current Outpatient Medications (Respiratory):    fexofenadine (ALLEGRA) 180 MG tablet, Take 180 mg by mouth daily.  Current Outpatient Medications (Analgesics):    Acetaminophen  (TYLENOL  PO), Take by mouth as needed.   aspirin  81 MG chewable tablet, Chew 81 mg by mouth daily.   naproxen sodium (ANAPROX) 220 MG tablet, Take 220 mg by mouth 2 (two) times daily with a meal.   Current Outpatient Medications (Other):    cholecalciferol (VITAMIN D ) 1000 UNITS tablet, Take 2,000 Units by mouth daily.   cyclobenzaprine (FLEXERIL) 10 MG tablet, Take 10 mg by mouth 3 (three) times daily as needed. For headache   Diclofenac Sodium 2 % SOLN, Place onto the skin.   gabapentin  (NEURONTIN ) 300 MG capsule, Take 1 capsule (300 mg total) by mouth 3 (three) times daily as needed.   Multiple Vitamin (MULTIVITAMIN WITH MINERALS) TABS, Take 1 tablet by mouth daily.   Na Sulfate-K Sulfate-Mg Sulf (SUPREP BOWEL PREP  KIT) 17.5-3.13-1.6 GM/177ML SOLN, Suprep as directed, no substitutions   OVER THE COUNTER MEDICATION, OTC laxative- unsure which kind- uses almost daily   Reviewed prior external information including notes and imaging from  primary care provider As well as notes that were available from care everywhere and other healthcare systems.  Past medical history, social, surgical and family history all reviewed in electronic medical record.  No pertanent information unless stated regarding to the chief complaint.   Review of Systems:  No headache, visual changes, nausea, vomiting, diarrhea, constipation, dizziness, abdominal pain, skin rash, fevers, chills, night sweats, weight loss, swollen lymph nodes, body aches, joint swelling, chest pain, shortness of breath, mood changes. POSITIVE muscle aches  Objective  Blood pressure 108/78, pulse 83, height 5\' 2"  (1.575 m), weight 185 lb (83.9 kg), SpO2 97%.   General: No apparent distress alert and oriented x3 mood and  affect normal, dressed appropriately.  HEENT: Pupils equal, extraocular movements intact  Respiratory: Patient's speak in full sentences and does not appear short of breath  Cardiovascular: No lower extremity edema, non tender, no erythema  Crepitus of the knee noted.  Right knee does have some instability noted. Left knee does have a replacement and is stable.  After informed written and verbal consent, patient was seated on exam table. Right knee was prepped with alcohol swab and utilizing anterolateral approach, patient's right knee space was injected with  4:1  marcaine  0.5%: Kenalog  40mg /dL. Patient tolerated the procedure well without immediate complications.   Impression and Recommendations:    The above documentation has been reviewed and is accurate and complete Malone Admire M Lianne Carreto, DO

## 2024-01-20 ENCOUNTER — Ambulatory Visit (INDEPENDENT_AMBULATORY_CARE_PROVIDER_SITE_OTHER): Payer: Medicare Other | Admitting: Family Medicine

## 2024-01-20 ENCOUNTER — Encounter: Payer: Self-pay | Admitting: Family Medicine

## 2024-01-20 VITALS — BP 108/78 | HR 83 | Ht 62.0 in | Wt 185.0 lb

## 2024-01-20 DIAGNOSIS — M1711 Unilateral primary osteoarthritis, right knee: Secondary | ICD-10-CM

## 2024-01-20 DIAGNOSIS — M170 Bilateral primary osteoarthritis of knee: Secondary | ICD-10-CM

## 2024-01-20 NOTE — Assessment & Plan Note (Signed)
 Repeat right knee injection given today.  Tolerated the procedure well.  Chronic problem with severe arthritic changes.  Still wants to avoid any surgical intervention and patient has been doing remarkably well but I think they will be able to.  Discussed icing regimen and home exercises.  Increase activity slowly.  Follow-up again in 10 weeks

## 2024-01-20 NOTE — Patient Instructions (Addendum)
Injected knee today See me again in 10 weeks

## 2024-04-05 NOTE — Progress Notes (Unsigned)
 [name] [address] [place] [address] Phone: [phone] Subjective:   Dawn [name], am serving as a scribe for Dr. Arthea Claudene.  I'm seeing this patient by the request  of:  Pahwani, Rinka R, MD  CC: Right knee pain  YEP:Dlagzrupcz  01/20/2024 Repeat right knee injection given today.  Tolerated the procedure well.  Chronic problem with severe arthritic changes.  Still wants to avoid any surgical intervention and patient has been doing remarkably well but I think they will be able to.  Discussed icing regimen and home exercises.  Increase activity slowly.  Follow-up again in 10 weeks   Update 04/06/2024 Dawn [name] is a 68 y.o. female coming in with complaint of  R knee pain. Patient states doing well. Injection still working now, but its starting to ease off. No new symptoms.      Past Medical History:  Diagnosis Date   Abnormal glucose    Allergic rhinitis    Allergy    Anxiety    no per pt   Arthritis    bilateral knees- get cortisone injections   Asthma    as a child   Carpal tunnel syndrome    right   Cervical dysplasia    1996   Endometriosis    Fibroids    Hemorrhoids    High cholesterol    Hyperlipemia    Hypertension    Migraine    Neck pain    Stroke (HCC) 2014   TIA- negative work up in ED 2014   Transient cerebral ischemia    10-22-12 ED, tia s/s but negative work up in ED. see note    Past Surgical History:  Procedure Laterality Date   ABDOMINAL HYSTERECTOMY  Partial   CARPAL TUNNEL RELEASE Right    cold knife cone     for dysplasia   COLONOSCOPY     last 11-2008 w/[facility], hems only    KNEE ARTHROSCOPY WITH MEDIAL MENISECTOMY Right 03/28/2016   Procedure: RIGHT KNEE ARTHROSCOPY CHONDROPLASTY WITH MEDIAL MENISCECTOMY;  Surgeon: Toribio JULIANNA Chancy, MD;  Location: [facility];  Service: Orthopedics;  Laterality: Right;   TRIGGER FINGER RELEASE     thumb, middle finger    TUBAL LIGATION      Social History   Socioeconomic History   Marital status: Married    Spouse name: Not on file   Number of children: 1   Years of education: Not on file   Highest education level: Not on file  Occupational History   Occupation: Retired  Tobacco Use   Smoking status: Never   Smokeless tobacco: Never  Vaping Use   Vaping status: Never Used  Substance and Sexual Activity   Alcohol use: Yes    Alcohol/week: 1.0 standard drink of alcohol    Types: 1 Glasses of wine per week    Comment: occasionally   Drug use: No   Sexual activity: Never  Other Topics Concern   Not on file  Social History Narrative   Not on file   Social Drivers of Health   Financial Resource Strain: Not on file  Food Insecurity: No Food Insecurity (11/07/2022)   Hunger Vital Sign    Worried About Running Out of Food in the Last Year: Never true    Ran Out of Food in the Last Year: Never true  Transportation Needs: No Transportation Needs (11/07/2022)   PRAPARE - Administrator, Civil Service (Medical):  No    Lack of Transportation (Non-Medical): No  Physical Activity: Not on file  Stress: Not on file  Social Connections: Not on file   Allergies  Allergen Reactions   Sulfa Antibiotics Rash   Family History  Problem Relation Age of Onset   Colon cancer Father    Stomach cancer Father    Diabetes Mother    Hypertension Mother    CVA Mother    Kidney disease Mother    Aneurysm Mother    Colon cancer Maternal Uncle    Breast cancer Maternal Aunt    Kidney disease Maternal Aunt    Heart disease Brother    Throat cancer Maternal Uncle    Rectal cancer Neg Hx    Esophageal cancer Neg Hx      Current Outpatient Medications (Cardiovascular):    LISINOPRIL PO, Take by mouth.   lisinopril-hydrochlorothiazide (ZESTORETIC) 20-25 MG tablet, Take 1 tablet by mouth daily.   propranolol  (INDERAL ) 20 MG tablet, Take 1 tablet (20 mg total) by mouth 2 (two) times daily as needed (20 mg twice a day  as needed for palpitations).   rosuvastatin (CRESTOR) 5 MG tablet, Take 5 mg by mouth daily.  Current Outpatient Medications (Respiratory):    fexofenadine (ALLEGRA) 180 MG tablet, Take 180 mg by mouth daily.  Current Outpatient Medications (Analgesics):    Acetaminophen  (TYLENOL  PO), Take by mouth as needed.   aspirin  81 MG chewable tablet, Chew 81 mg by mouth daily.   naproxen sodium (ANAPROX) 220 MG tablet, Take 220 mg by mouth 2 (two) times daily with a meal.   Current Outpatient Medications (Other):    cholecalciferol (VITAMIN D ) 1000 UNITS tablet, Take 2,000 Units by mouth daily.   cyclobenzaprine (FLEXERIL) 10 MG tablet, Take 10 mg by mouth 3 (three) times daily as needed. For headache   Diclofenac Sodium 2 % SOLN, Place onto the skin.   gabapentin  (NEURONTIN ) 300 MG capsule, Take 1 capsule (300 mg total) by mouth 3 (three) times daily as needed.   Multiple Vitamin (MULTIVITAMIN WITH MINERALS) TABS, Take 1 tablet by mouth daily.   Na Sulfate-K Sulfate-Mg Sulf (SUPREP BOWEL PREP  KIT) 17.5-3.13-1.6 GM/177ML SOLN, Suprep as directed, no substitutions   OVER THE COUNTER MEDICATION, OTC laxative- unsure which kind- uses almost daily   Reviewed prior external information including notes and imaging from  primary care provider As well as notes that were available from care everywhere and other healthcare systems.  Past medical history, social, surgical and family history all reviewed in electronic medical record.  No pertanent information unless stated regarding to the chief complaint.   Review of Systems:  No headache, visual changes, nausea, vomiting, diarrhea, constipation, dizziness, abdominal pain, skin rash, fevers, chills, night sweats, weight loss, swollen lymph nodes, body aches, , chest pain, shortness of breath, mood changes. POSITIVE muscle aches, joint swelling  Objective  Blood pressure 118/86, pulse 74, height 5' 2 (1.575 m), weight 191 lb (86.6 kg), SpO2 96%.    General: No apparent distress alert and oriented x3 mood and affect normal, dressed appropriately.  HEENT: Pupils equal, extraocular movements intact  Respiratory: Patient's speak in full sentences and does not appear short of breath  Cardiovascular: No lower extremity edema, non tender, no erythema  Right knee exam shows arthritic changes noted.  Trace effusion noted.  Some instability with valgus and varus force. Left knee does have replacement noted.  No significant swelling.  After informed written and verbal consent, patient was seated on  exam table. Right knee was prepped with alcohol swab and utilizing anterolateral approach, patient's right knee space was injected with 4:1  marcaine  0.5%: Kenalog  40mg /dL. Patient tolerated the procedure well without immediate complications.    Impression and Recommendations:    The above documentation has been reviewed and is accurate and complete Ronaldo Crilly M Abhijot Straughter, DO

## 2024-04-06 ENCOUNTER — Encounter: Payer: Self-pay | Admitting: Family Medicine

## 2024-04-06 ENCOUNTER — Ambulatory Visit (INDEPENDENT_AMBULATORY_CARE_PROVIDER_SITE_OTHER): Admitting: Family Medicine

## 2024-04-06 VITALS — BP 118/86 | HR 74 | Ht 62.0 in | Wt 191.0 lb

## 2024-04-06 DIAGNOSIS — M170 Bilateral primary osteoarthritis of knee: Secondary | ICD-10-CM

## 2024-04-06 NOTE — Patient Instructions (Signed)
 Good to see you! Knee injection today See you again in 3 months

## 2024-04-06 NOTE — Assessment & Plan Note (Signed)
 Chronic, worsening symptoms.  Still trying to avoid any surgical intervention.  Did have a replacement on the left side and is doing relatively well with that but would like to avoid surgery if possible.  Has responded well to injections.  Follow-up with me again 12 weeks.  Continue to keep BMI under 35.

## 2024-06-15 ENCOUNTER — Ambulatory Visit (INDEPENDENT_AMBULATORY_CARE_PROVIDER_SITE_OTHER): Admitting: Family Medicine

## 2024-06-15 ENCOUNTER — Encounter: Payer: Self-pay | Admitting: Family Medicine

## 2024-06-15 VITALS — BP 118/76 | HR 79 | Ht 62.0 in | Wt 185.0 lb

## 2024-06-15 DIAGNOSIS — M170 Bilateral primary osteoarthritis of knee: Secondary | ICD-10-CM

## 2024-06-15 DIAGNOSIS — M1711 Unilateral primary osteoarthritis, right knee: Secondary | ICD-10-CM

## 2024-06-15 NOTE — Patient Instructions (Signed)
 Good to see you! Injection in R knee today See you again in 10-12 weeks

## 2024-06-15 NOTE — Progress Notes (Signed)
 [facility] [address] [place] [address] Phone: [phone] Subjective:    I'm seeing this patient by the request  of:  Pahwani, Rinka R, MD  CC: Right knee pain  YEP:Dlagzrupcz  04/06/2024 Chronic, worsening symptoms.  Still trying to avoid any surgical intervention.  Did have a replacement on the left side and is doing relatively well with that but would like to avoid surgery if possible.  Has responded well to injections.  Follow-up with me again 12 weeks.  Continue to keep BMI under 35.   Updated 06/15/2024 INETA SINNING is a 68 y.o. female coming in with complaint of R knee is hurting today.  Severe overall.  Starting to affect some daily activities.  Still responds well to the injection on a regular basis.       Past Medical History:  Diagnosis Date   Abnormal glucose    Allergic rhinitis    Allergy    Anxiety    no per pt   Arthritis    bilateral knees- get cortisone injections   Asthma    as a child   Carpal tunnel syndrome    right   Cervical dysplasia    1996   Endometriosis    Fibroids    Hemorrhoids    High cholesterol    Hyperlipemia    Hypertension    Migraine    Neck pain    Stroke (HCC) 2014   TIA- negative work up in ED 2014   Transient cerebral ischemia    10-22-12 ED, tia s/s but negative work up in ED. see note    Past Surgical History:  Procedure Laterality Date   ABDOMINAL HYSTERECTOMY  Partial   CARPAL TUNNEL RELEASE Right    cold knife cone     for dysplasia   COLONOSCOPY     last 11-2008 w/[facility], hems only    KNEE ARTHROSCOPY WITH MEDIAL MENISECTOMY Right 03/28/2016   Procedure: RIGHT KNEE ARTHROSCOPY CHONDROPLASTY WITH MEDIAL MENISCECTOMY;  Surgeon: Toribio JULIANNA Chancy, MD;  [facility];  Service: Orthopedics;  Laterality: Right;   TRIGGER FINGER RELEASE     thumb, middle finger    TUBAL LIGATION     Social History   Socioeconomic History   Marital status: Married     Spouse name: Not on file   Number of children: 1   Years of education: Not on file   Highest education level: Not on file  Occupational History   Occupation: Retired  Tobacco Use   Smoking status: Never   Smokeless tobacco: Never  Vaping Use   Vaping status: Never Used  Substance and Sexual Activity   Alcohol use: Yes    Alcohol/week: 1.0 standard drink of alcohol    Types: 1 Glasses of wine per week    Comment: occasionally   Drug use: No   Sexual activity: Never  Other Topics Concern   Not on file  Social History Narrative   Not on file   Social Drivers of Health   Financial Resource Strain: Not on file  Food Insecurity: No Food Insecurity (11/07/2022)   Hunger Vital Sign    Worried About Running Out of Food in the Last Year: Never true    Ran Out of Food in the Last Year: Never true  Transportation Needs: No Transportation Needs (11/07/2022)   PRAPARE - Administrator, Civil Service (Medical): No    Lack of Transportation (Non-Medical): No  Physical Activity: Not on file  Stress: Not on file  Social Connections: Not on file   Allergies  Allergen Reactions   Sulfa Antibiotics Rash   Family History  Problem Relation Age of Onset   Colon cancer Father    Stomach cancer Father    Diabetes Mother    Hypertension Mother    CVA Mother    Kidney disease Mother    Aneurysm Mother    Colon cancer Maternal Uncle    Breast cancer Maternal Aunt    Kidney disease Maternal Aunt    Heart disease Brother    Throat cancer Maternal Uncle    Rectal cancer Neg Hx    Esophageal cancer Neg Hx      Current Outpatient Medications (Cardiovascular):    LISINOPRIL PO, Take by mouth.   lisinopril-hydrochlorothiazide (ZESTORETIC) 20-25 MG tablet, Take 1 tablet by mouth daily.   propranolol  (INDERAL ) 20 MG tablet, Take 1 tablet (20 mg total) by mouth 2 (two) times daily as needed (20 mg twice a day as needed for palpitations).   rosuvastatin (CRESTOR) 5 MG tablet, Take  5 mg by mouth daily.  Current Outpatient Medications (Respiratory):    fexofenadine (ALLEGRA) 180 MG tablet, Take 180 mg by mouth daily.  Current Outpatient Medications (Analgesics):    Acetaminophen  (TYLENOL  PO), Take by mouth as needed.   aspirin  81 MG chewable tablet, Chew 81 mg by mouth daily.   naproxen sodium (ANAPROX) 220 MG tablet, Take 220 mg by mouth 2 (two) times daily with a meal.   Current Outpatient Medications (Other):    cholecalciferol (VITAMIN D ) 1000 UNITS tablet, Take 2,000 Units by mouth daily.   cyclobenzaprine (FLEXERIL) 10 MG tablet, Take 10 mg by mouth 3 (three) times daily as needed. For headache   Diclofenac Sodium 2 % SOLN, Place onto the skin.   gabapentin  (NEURONTIN ) 300 MG capsule, Take 1 capsule (300 mg total) by mouth 3 (three) times daily as needed.   Multiple Vitamin (MULTIVITAMIN WITH MINERALS) TABS, Take 1 tablet by mouth daily.   Na Sulfate-K Sulfate-Mg Sulf (SUPREP BOWEL PREP  KIT) 17.5-3.13-1.6 GM/177ML SOLN, Suprep as directed, no substitutions   OVER THE COUNTER MEDICATION, OTC laxative- unsure which kind- uses almost daily   Reviewed prior external information including notes and imaging from  primary care provider As well as notes that were available from care everywhere and other healthcare systems.  Past medical history, social, surgical and family history all reviewed in electronic medical record.  No pertanent information unless stated regarding to the chief complaint.   Review of Systems:  No headache, visual changes, nausea, vomiting, diarrhea, constipation, dizziness, abdominal pain, skin rash, fevers, chills, night sweats, weight loss, swollen lymph nodes, body aches, joint swelling, chest pain, shortness of breath, mood changes. POSITIVE muscle aches  Objective  Blood pressure 118/76, pulse 79, height 5' 2 (1.575 m), weight 185 lb (83.9 kg), SpO2 97%.   General: No apparent distress alert and oriented x3 mood and affect normal,  dressed appropriately.  HEENT: Pupils equal, extraocular movements intact  Respiratory: Patient's speak in full sentences and does not appear short of breath  Cardiovascular: No lower extremity edema, non tender, no erythema  Antalgic gait noted.  Patient has a replacement on the left knee.  Seems to be doing well.  Right knee does have some limited range of motion with some instability with valgus and varus force.   After informed written and verbal consent, patient was seated on exam table. Right  knee was prepped with alcohol swab and utilizing anterolateral approach, patient's right knee space was injected with 4:1  marcaine  0.5%: Kenalog  40mg /dL. Patient tolerated the procedure well without immediate complications.   Impression and Recommendations:    Degenerative arthritis of knee, bilateral Right sided injected again.  Chronic problem with worsening symptoms.  Still wants to hold on any type of intervention at this time.  Discussed which activities to do and which ones to avoid.  Increase activity slowly.  Follow-up again in 6 to 8 weeks otherwise.  The above documentation has been reviewed and is accurate and complete Anice Wilshire M Jamone Garrido, DO

## 2024-06-15 NOTE — Assessment & Plan Note (Signed)
 Right sided injected again.  Chronic problem with worsening symptoms.  Still wants to hold on any type of intervention at this time.  Discussed which activities to do and which ones to avoid.  Increase activity slowly.  Follow-up again in 6 to 8 weeks otherwise.

## 2024-06-17 ENCOUNTER — Ambulatory Visit: Admitting: Family Medicine

## 2024-07-07 ENCOUNTER — Ambulatory Visit: Admitting: Family Medicine

## 2024-08-19 NOTE — Progress Notes (Signed)
 [name] [address] [place] [address] Phone: [phone] Subjective:   Dawn [name] am a scribe for Dr. Claudene.   I'm seeing this patient by the request  of:  Pahwani, Rinka R, MD  CC: Bilateral knee pain  YEP:Dlagzrupcz  06/15/2024 Right sided injected again.  Chronic problem with worsening symptoms.  Still wants to hold on any type of intervention at this time.  Discussed which activities to do and which ones to avoid.  Increase activity slowly.  Follow-up again in 6 to 8 weeks otherwise.      Update 08/24/2024 Dawn [name] is a 68 y.o. female coming in with complaint of B knee pain. Patient states that the right knee is stiff and terrible today.  Still having pain on a daily basis.     Past Medical History:  Diagnosis Date   Abnormal glucose    Allergic rhinitis    Allergy    Anxiety    no per pt   Arthritis    bilateral knees- get cortisone injections   Asthma    as a child   Carpal tunnel syndrome    right   Cervical dysplasia    1996   Endometriosis    Fibroids    Hemorrhoids    High cholesterol    Hyperlipemia    Hypertension    Migraine    Neck pain    Stroke (HCC) 2014   TIA- negative work up in ED 2014   Transient cerebral ischemia    10-22-12 ED, tia s/s but negative work up in ED. see note    Past Surgical History:  Procedure Laterality Date   ABDOMINAL HYSTERECTOMY  Partial   CARPAL TUNNEL RELEASE Right    cold knife cone     for dysplasia   COLONOSCOPY     last 11-2008 w/[facility], hems only    KNEE ARTHROSCOPY WITH MEDIAL MENISECTOMY Right 03/28/2016   Procedure: RIGHT KNEE ARTHROSCOPY CHONDROPLASTY WITH MEDIAL MENISCECTOMY;  Surgeon: Toribio JULIANNA Chancy, MD;  Location: [facility];  Service: Orthopedics;  Laterality: Right;   TRIGGER FINGER RELEASE     thumb, middle finger    TUBAL LIGATION     Social History   Socioeconomic History   Marital status: Married    Spouse name:  Not on file   Number of children: 1   Years of education: Not on file   Highest education level: Not on file  Occupational History   Occupation: Retired  Tobacco Use   Smoking status: Never   Smokeless tobacco: Never  Vaping Use   Vaping status: Never Used  Substance and Sexual Activity   Alcohol use: Yes    Alcohol/week: 1.0 standard drink of alcohol    Types: 1 Glasses of wine per week    Comment: occasionally   Drug use: No   Sexual activity: Never  Other Topics Concern   Not on file  Social History Narrative   Not on file   Social Drivers of Health   Financial Resource Strain: Not on file  Food Insecurity: No Food Insecurity (11/07/2022)   Hunger Vital Sign    Worried About Running Out of Food in the Last Year: Never true    Ran Out of Food in the Last Year: Never true  Transportation Needs: No Transportation Needs (11/07/2022)   PRAPARE - Administrator, Civil Service (Medical): No    Lack of Transportation (Non-Medical): No  Physical Activity: Not on file  Stress: Not on file  Social Connections: Not on file   Allergies  Allergen Reactions   Sulfa Antibiotics Rash   Family History  Problem Relation Age of Onset   Colon cancer Father    Stomach cancer Father    Diabetes Mother    Hypertension Mother    CVA Mother    Kidney disease Mother    Aneurysm Mother    Colon cancer Maternal Uncle    Breast cancer Maternal Aunt    Kidney disease Maternal Aunt    Heart disease Brother    Throat cancer Maternal Uncle    Rectal cancer Neg Hx    Esophageal cancer Neg Hx      Current Outpatient Medications (Cardiovascular):    LISINOPRIL PO, Take by mouth.   lisinopril-hydrochlorothiazide (ZESTORETIC) 20-25 MG tablet, Take 1 tablet by mouth daily.   propranolol  (INDERAL ) 20 MG tablet, Take 1 tablet (20 mg total) by mouth 2 (two) times daily as needed (20 mg twice a day as needed for palpitations).   rosuvastatin (CRESTOR) 5 MG tablet, Take 5 mg by mouth  daily.  Current Outpatient Medications (Respiratory):    fexofenadine (ALLEGRA) 180 MG tablet, Take 180 mg by mouth daily.  Current Outpatient Medications (Analgesics):    Acetaminophen  (TYLENOL  PO), Take by mouth as needed.   aspirin  81 MG chewable tablet, Chew 81 mg by mouth daily.   naproxen sodium (ANAPROX) 220 MG tablet, Take 220 mg by mouth 2 (two) times daily with a meal.   Current Outpatient Medications (Other):    cholecalciferol (VITAMIN D ) 1000 UNITS tablet, Take 2,000 Units by mouth daily.   cyclobenzaprine (FLEXERIL) 10 MG tablet, Take 10 mg by mouth 3 (three) times daily as needed. For headache   Diclofenac Sodium 2 % SOLN, Place onto the skin.   gabapentin  (NEURONTIN ) 300 MG capsule, Take 1 capsule (300 mg total) by mouth 3 (three) times daily as needed.   Multiple Vitamin (MULTIVITAMIN WITH MINERALS) TABS, Take 1 tablet by mouth daily.   Na Sulfate-K Sulfate-Mg Sulf (SUPREP BOWEL PREP  KIT) 17.5-3.13-1.6 GM/177ML SOLN, Suprep as directed, no substitutions   OVER THE COUNTER MEDICATION, OTC laxative- unsure which kind- uses almost daily   Reviewed prior external information including notes and imaging from  primary care provider As well as notes that were available from care everywhere and other healthcare systems.  Past medical history, social, surgical and family history all reviewed in electronic medical record.  No pertanent information unless stated regarding to the chief complaint.   Review of Systems:  No headache, visual changes, nausea, vomiting, diarrhea, constipation, dizziness, abdominal pain, skin rash, fevers, chills, night sweats, weight loss, swollen lymph nodes, body aches, joint swelling, chest pain, shortness of breath, mood changes. POSITIVE muscle aches  Objective  Blood pressure 122/60, pulse 93, height 5' 2 (1.575 m), weight 187 lb (84.8 kg), SpO2 97%.   General: No apparent distress alert and oriented x3 mood and affect normal, dressed  appropriately.  HEENT: Pupils equal, extraocular movements intact  Respiratory: Patient's speak in full sentences and does not appear short of breath  Cardiovascular: No lower extremity edema, non tender, no erythema  Bilateral knee exam shows Antalgic gait still noted.  Patient still favoring the right knee.  His left knee does have the replacement noted.  Right knee does have some limited range of motion and some instability  After informed written and verbal consent, patient was seated on exam table. Right  knee was prepped with alcohol swab and utilizing anterolateral approach, patient's right knee space was injected with 4:1  marcaine  0.5%: Kenalog  40mg /dL. Patient tolerated the procedure well without immediate complications.   Impression and Recommendations:     The above documentation has been reviewed and is accurate and complete Elin Fenley M Massiel Stipp, DO

## 2024-08-24 ENCOUNTER — Encounter: Payer: Self-pay | Admitting: Family Medicine

## 2024-08-24 ENCOUNTER — Ambulatory Visit: Admitting: Family Medicine

## 2024-08-24 VITALS — BP 122/60 | HR 93 | Ht 62.0 in | Wt 187.0 lb

## 2024-08-24 DIAGNOSIS — G8929 Other chronic pain: Secondary | ICD-10-CM

## 2024-08-24 DIAGNOSIS — M25561 Pain in right knee: Secondary | ICD-10-CM | POA: Diagnosis not present

## 2024-08-24 DIAGNOSIS — M1711 Unilateral primary osteoarthritis, right knee: Secondary | ICD-10-CM | POA: Diagnosis not present

## 2024-08-24 NOTE — Assessment & Plan Note (Signed)
 Right knee injected again today.  Tolerated the procedure well, still wants to avoid any type of surgical intervention which patient has had on the contralateral side.  Wants to see if she can make it potentially longer.  Continue to wear brace for stability.  Follow-up with me again in 3 months otherwise chronic problem with exacerbation

## 2024-08-24 NOTE — Patient Instructions (Addendum)
 Good to see you. Injected knee today. Next fall start Zyrtec and Flonase. See me again in 3 months.

## 2024-11-03 ENCOUNTER — Ambulatory Visit: Admitting: Family Medicine

## 2024-11-03 ENCOUNTER — Encounter: Payer: Self-pay | Admitting: Family Medicine

## 2024-11-03 ENCOUNTER — Ambulatory Visit

## 2024-11-03 VITALS — BP 114/78 | HR 93 | Ht 62.0 in | Wt 188.0 lb

## 2024-11-03 DIAGNOSIS — M25511 Pain in right shoulder: Secondary | ICD-10-CM | POA: Diagnosis not present

## 2024-11-03 DIAGNOSIS — M1711 Unilateral primary osteoarthritis, right knee: Secondary | ICD-10-CM | POA: Diagnosis not present

## 2024-11-03 NOTE — Patient Instructions (Addendum)
 Xray today Do prescribed exercises at least 3x a week Ice 20 mins every 4 hours See you again in 10 weeks

## 2024-11-03 NOTE — Assessment & Plan Note (Signed)
 Patient given another injection today.  Still wants to try to avoid any surgical intervention.  Discussed icing regimen and home exercises, increase activity slowly.  Discussed home exercises.  Follow-up again in 6 to 12 weeks.

## 2024-11-05 ENCOUNTER — Ambulatory Visit: Payer: Self-pay | Admitting: Family Medicine

## 2024-11-24 ENCOUNTER — Ambulatory Visit: Admitting: Family Medicine

## 2025-01-12 ENCOUNTER — Ambulatory Visit: Admitting: Family Medicine
